# Patient Record
Sex: Male | Born: 1955 | Race: Black or African American | Hispanic: No | Marital: Single | Smoking: Never smoker
Health system: Southern US, Community
[De-identification: ages and names within clinical notes are randomized; demographics above are authoritative.]

## PROBLEM LIST (undated history)

## (undated) DIAGNOSIS — Z992 Dependence on renal dialysis: Secondary | ICD-10-CM

## (undated) DIAGNOSIS — N186 End stage renal disease: Secondary | ICD-10-CM

## (undated) DIAGNOSIS — D649 Anemia, unspecified: Secondary | ICD-10-CM

## (undated) DIAGNOSIS — E119 Type 2 diabetes mellitus without complications: Secondary | ICD-10-CM

---

## 2004-07-20 ENCOUNTER — Emergency Department: Payer: Self-pay | Admitting: Emergency Medicine

## 2006-08-02 ENCOUNTER — Emergency Department: Payer: Self-pay | Admitting: Emergency Medicine

## 2006-12-20 ENCOUNTER — Emergency Department: Payer: Self-pay

## 2008-07-18 ENCOUNTER — Emergency Department: Payer: Self-pay | Admitting: Emergency Medicine

## 2009-10-30 ENCOUNTER — Other Ambulatory Visit: Payer: Self-pay | Admitting: Internal Medicine

## 2009-10-31 ENCOUNTER — Ambulatory Visit: Payer: Self-pay | Admitting: Nephrology

## 2009-11-11 ENCOUNTER — Ambulatory Visit: Payer: Self-pay | Admitting: Oncology

## 2009-11-13 ENCOUNTER — Other Ambulatory Visit: Payer: Self-pay | Admitting: Nephrology

## 2009-11-14 ENCOUNTER — Inpatient Hospital Stay: Payer: Self-pay | Admitting: Internal Medicine

## 2009-12-12 ENCOUNTER — Ambulatory Visit: Payer: Self-pay | Admitting: Oncology

## 2010-02-11 ENCOUNTER — Ambulatory Visit: Payer: Self-pay | Admitting: Oncology

## 2010-03-23 ENCOUNTER — Ambulatory Visit: Payer: Self-pay | Admitting: Vascular Surgery

## 2010-06-10 ENCOUNTER — Ambulatory Visit: Payer: Self-pay | Admitting: Vascular Surgery

## 2010-06-17 ENCOUNTER — Ambulatory Visit: Payer: Self-pay | Admitting: Vascular Surgery

## 2010-08-21 ENCOUNTER — Ambulatory Visit: Payer: Self-pay | Admitting: Internal Medicine

## 2010-10-22 ENCOUNTER — Emergency Department: Payer: Self-pay | Admitting: Emergency Medicine

## 2010-10-23 ENCOUNTER — Inpatient Hospital Stay: Payer: Self-pay | Admitting: Vascular Surgery

## 2010-12-07 ENCOUNTER — Ambulatory Visit: Payer: Self-pay | Admitting: Vascular Surgery

## 2010-12-16 ENCOUNTER — Ambulatory Visit: Payer: Self-pay | Admitting: Vascular Surgery

## 2011-01-11 ENCOUNTER — Ambulatory Visit: Payer: Self-pay | Admitting: Internal Medicine

## 2011-03-05 ENCOUNTER — Ambulatory Visit: Payer: Self-pay | Admitting: Vascular Surgery

## 2011-03-12 ENCOUNTER — Ambulatory Visit: Payer: Self-pay | Admitting: Vascular Surgery

## 2011-03-26 ENCOUNTER — Ambulatory Visit: Payer: Self-pay | Admitting: Vascular Surgery

## 2011-04-23 ENCOUNTER — Ambulatory Visit: Payer: Self-pay | Admitting: Vascular Surgery

## 2011-06-01 ENCOUNTER — Ambulatory Visit: Payer: Self-pay | Admitting: Vascular Surgery

## 2011-10-08 ENCOUNTER — Ambulatory Visit: Payer: Self-pay | Admitting: Vascular Surgery

## 2011-10-08 LAB — POTASSIUM: Potassium: 4.2 mmol/L (ref 3.5–5.1)

## 2011-11-19 ENCOUNTER — Ambulatory Visit: Payer: Self-pay | Admitting: Vascular Surgery

## 2011-12-10 ENCOUNTER — Ambulatory Visit: Payer: Self-pay | Admitting: Vascular Surgery

## 2011-12-10 LAB — POTASSIUM: Potassium: 3.6 mmol/L (ref 3.5–5.1)

## 2011-12-24 ENCOUNTER — Ambulatory Visit: Payer: Self-pay | Admitting: Vascular Surgery

## 2011-12-24 LAB — BASIC METABOLIC PANEL
BUN: 19 mg/dL — ABNORMAL HIGH (ref 7–18)
Calcium, Total: 9.5 mg/dL (ref 8.5–10.1)
Chloride: 97 mmol/L — ABNORMAL LOW (ref 98–107)
Co2: 31 mmol/L (ref 21–32)
EGFR (African American): 7 — ABNORMAL LOW
EGFR (Non-African Amer.): 6 — ABNORMAL LOW
Osmolality: 287 (ref 275–301)
Potassium: 4.9 mmol/L (ref 3.5–5.1)
Sodium: 140 mmol/L (ref 136–145)

## 2011-12-24 LAB — CBC
MCH: 31 pg (ref 26.0–34.0)
MCHC: 32.1 g/dL (ref 32.0–36.0)
MCV: 96 fL (ref 80–100)
Platelet: 225 10*3/uL (ref 150–440)
RBC: 3.35 10*6/uL — ABNORMAL LOW (ref 4.40–5.90)

## 2011-12-31 ENCOUNTER — Ambulatory Visit: Payer: Self-pay | Admitting: Vascular Surgery

## 2011-12-31 LAB — POTASSIUM: Potassium: 3.9 mmol/L (ref 3.5–5.1)

## 2012-09-20 ENCOUNTER — Ambulatory Visit: Payer: Self-pay | Admitting: Vascular Surgery

## 2012-10-18 ENCOUNTER — Ambulatory Visit: Payer: Self-pay | Admitting: Nephrology

## 2013-03-02 ENCOUNTER — Ambulatory Visit: Payer: Self-pay | Admitting: Urology

## 2013-06-29 ENCOUNTER — Ambulatory Visit: Payer: Self-pay | Admitting: Vascular Surgery

## 2014-02-18 ENCOUNTER — Ambulatory Visit: Payer: Self-pay | Admitting: Urology

## 2014-03-16 ENCOUNTER — Other Ambulatory Visit: Payer: Self-pay | Admitting: Nephrology

## 2014-03-16 LAB — POTASSIUM: Potassium: 4.2 mmol/L (ref 3.5–5.1)

## 2014-05-22 ENCOUNTER — Ambulatory Visit: Payer: Self-pay | Admitting: Nephrology

## 2014-08-27 ENCOUNTER — Inpatient Hospital Stay: Payer: Self-pay | Admitting: Internal Medicine

## 2014-08-27 LAB — COMPREHENSIVE METABOLIC PANEL
ALT: 14 U/L
Albumin: 2.4 g/dL — ABNORMAL LOW (ref 3.4–5.0)
Alkaline Phosphatase: 77 U/L
Anion Gap: 11 (ref 7–16)
BUN: 51 mg/dL — AB (ref 7–18)
Bilirubin,Total: 0.8 mg/dL (ref 0.2–1.0)
CALCIUM: 10.3 mg/dL — AB (ref 8.5–10.1)
CO2: 31 mmol/L (ref 21–32)
Chloride: 92 mmol/L — ABNORMAL LOW (ref 98–107)
Creatinine: 12.55 mg/dL — ABNORMAL HIGH (ref 0.60–1.30)
GFR CALC AF AMER: 5 — AB
GFR CALC NON AF AMER: 4 — AB
Glucose: 203 mg/dL — ABNORMAL HIGH (ref 65–99)
OSMOLALITY: 288 (ref 275–301)
Potassium: 4.1 mmol/L (ref 3.5–5.1)
SGOT(AST): 17 U/L (ref 15–37)
Sodium: 134 mmol/L — ABNORMAL LOW (ref 136–145)
Total Protein: 7.8 g/dL (ref 6.4–8.2)

## 2014-08-27 LAB — PROTIME-INR
INR: 1.5
Prothrombin Time: 17.8 secs — ABNORMAL HIGH (ref 11.5–14.7)

## 2014-08-27 LAB — CBC
HCT: 25.3 % — ABNORMAL LOW (ref 40.0–52.0)
HGB: 7.8 g/dL — AB (ref 13.0–18.0)
MCH: 26.9 pg (ref 26.0–34.0)
MCHC: 30.7 g/dL — AB (ref 32.0–36.0)
MCV: 88 fL (ref 80–100)
PLATELETS: 323 10*3/uL (ref 150–440)
RBC: 2.89 10*6/uL — ABNORMAL LOW (ref 4.40–5.90)
RDW: 17 % — ABNORMAL HIGH (ref 11.5–14.5)
WBC: 17.8 10*3/uL — AB (ref 3.8–10.6)

## 2014-08-27 LAB — CK TOTAL AND CKMB (NOT AT ARMC)
CK, TOTAL: 158 U/L (ref 39–308)
CK-MB: 0.7 ng/mL (ref 0.5–3.6)

## 2014-08-27 LAB — TROPONIN I: Troponin-I: 0.02 ng/mL

## 2014-08-27 LAB — PRO B NATRIURETIC PEPTIDE: B-Type Natriuretic Peptide: 13637 pg/mL — ABNORMAL HIGH (ref 0–125)

## 2014-08-27 LAB — PHOSPHORUS: PHOSPHORUS: 6.5 mg/dL — AB (ref 2.5–4.9)

## 2014-08-28 LAB — CBC WITH DIFFERENTIAL/PLATELET
BASOS PCT: 0.4 %
Basophil #: 0.1 10*3/uL (ref 0.0–0.1)
EOS ABS: 0.6 10*3/uL (ref 0.0–0.7)
EOS PCT: 5.2 %
HCT: 27.6 % — AB (ref 40.0–52.0)
HGB: 8.5 g/dL — AB (ref 13.0–18.0)
LYMPHS PCT: 9.3 %
Lymphocyte #: 1.1 10*3/uL (ref 1.0–3.6)
MCH: 26.9 pg (ref 26.0–34.0)
MCHC: 30.6 g/dL — ABNORMAL LOW (ref 32.0–36.0)
MCV: 88 fL (ref 80–100)
Monocyte #: 1.5 x10 3/mm — ABNORMAL HIGH (ref 0.2–1.0)
Monocyte %: 13 %
Neutrophil #: 8.6 10*3/uL — ABNORMAL HIGH (ref 1.4–6.5)
Neutrophil %: 72.1 %
PLATELETS: 317 10*3/uL (ref 150–440)
RBC: 3.15 10*6/uL — ABNORMAL LOW (ref 4.40–5.90)
RDW: 17 % — AB (ref 11.5–14.5)
WBC: 11.9 10*3/uL — ABNORMAL HIGH (ref 3.8–10.6)

## 2014-08-28 LAB — CLOSTRIDIUM DIFFICILE(ARMC)

## 2014-08-29 LAB — CULTURE, BLOOD (SINGLE)

## 2014-08-30 LAB — RENAL FUNCTION PANEL
Albumin: 2.2 g/dL — ABNORMAL LOW (ref 3.4–5.0)
Anion Gap: 13 (ref 7–16)
BUN: 62 mg/dL — AB (ref 7–18)
CO2: 29 mmol/L (ref 21–32)
Calcium, Total: 9.9 mg/dL (ref 8.5–10.1)
Chloride: 95 mmol/L — ABNORMAL LOW (ref 98–107)
Creatinine: 13.58 mg/dL — ABNORMAL HIGH (ref 0.60–1.30)
EGFR (African American): 5 — ABNORMAL LOW
EGFR (Non-African Amer.): 4 — ABNORMAL LOW
GLUCOSE: 194 mg/dL — AB (ref 65–99)
Osmolality: 297 (ref 275–301)
Phosphorus: 6.1 mg/dL — ABNORMAL HIGH (ref 2.5–4.9)
Potassium: 4 mmol/L (ref 3.5–5.1)
Sodium: 137 mmol/L (ref 136–145)

## 2014-08-30 LAB — CBC WITH DIFFERENTIAL/PLATELET
BASOS ABS: 0.1 10*3/uL (ref 0.0–0.1)
Basophil %: 0.5 %
Eosinophil #: 0.5 10*3/uL (ref 0.0–0.7)
Eosinophil %: 4.2 %
HCT: 24.5 % — ABNORMAL LOW (ref 40.0–52.0)
HGB: 7.3 g/dL — ABNORMAL LOW (ref 13.0–18.0)
Lymphocyte #: 1.1 10*3/uL (ref 1.0–3.6)
Lymphocyte %: 9.5 %
MCH: 26.6 pg (ref 26.0–34.0)
MCHC: 29.9 g/dL — AB (ref 32.0–36.0)
MCV: 89 fL (ref 80–100)
MONO ABS: 1.4 x10 3/mm — AB (ref 0.2–1.0)
Monocyte %: 11.8 %
Neutrophil #: 8.5 10*3/uL — ABNORMAL HIGH (ref 1.4–6.5)
Neutrophil %: 74 %
Platelet: 266 10*3/uL (ref 150–440)
RBC: 2.75 10*6/uL — ABNORMAL LOW (ref 4.40–5.90)
RDW: 17.4 % — ABNORMAL HIGH (ref 11.5–14.5)
WBC: 11.5 10*3/uL — ABNORMAL HIGH (ref 3.8–10.6)

## 2014-08-31 LAB — PHOSPHORUS: Phosphorus: 4.9 mg/dL (ref 2.5–4.9)

## 2014-08-31 LAB — VANCOMYCIN, TROUGH: Vancomycin, Trough: 24 ug/mL (ref 10–20)

## 2014-09-01 LAB — CBC WITH DIFFERENTIAL/PLATELET
BASOS ABS: 0.1 10*3/uL (ref 0.0–0.1)
Basophil %: 1 %
EOS ABS: 0.4 10*3/uL (ref 0.0–0.7)
Eosinophil %: 3 %
HCT: 24.1 % — AB (ref 40.0–52.0)
HGB: 7.3 g/dL — ABNORMAL LOW (ref 13.0–18.0)
LYMPHS PCT: 14.6 %
Lymphocyte #: 2 10*3/uL (ref 1.0–3.6)
MCH: 26.9 pg (ref 26.0–34.0)
MCHC: 30.4 g/dL — AB (ref 32.0–36.0)
MCV: 89 fL (ref 80–100)
MONOS PCT: 11.6 %
Monocyte #: 1.6 x10 3/mm — ABNORMAL HIGH (ref 0.2–1.0)
Neutrophil #: 9.4 10*3/uL — ABNORMAL HIGH (ref 1.4–6.5)
Neutrophil %: 69.8 %
Platelet: 243 10*3/uL (ref 150–440)
RBC: 2.72 10*6/uL — ABNORMAL LOW (ref 4.40–5.90)
RDW: 17.3 % — ABNORMAL HIGH (ref 11.5–14.5)
WBC: 13.4 10*3/uL — AB (ref 3.8–10.6)

## 2014-09-01 LAB — BASIC METABOLIC PANEL
ANION GAP: 7 (ref 7–16)
BUN: 26 mg/dL — AB (ref 7–18)
CALCIUM: 10.4 mg/dL — AB (ref 8.5–10.1)
Chloride: 97 mmol/L — ABNORMAL LOW (ref 98–107)
Co2: 32 mmol/L (ref 21–32)
Creatinine: 6.69 mg/dL — ABNORMAL HIGH (ref 0.60–1.30)
EGFR (African American): 11 — ABNORMAL LOW
GFR CALC NON AF AMER: 9 — AB
Glucose: 215 mg/dL — ABNORMAL HIGH (ref 65–99)
Osmolality: 283 (ref 275–301)
POTASSIUM: 4.2 mmol/L (ref 3.5–5.1)
Sodium: 136 mmol/L (ref 136–145)

## 2014-09-02 LAB — CBC WITH DIFFERENTIAL/PLATELET
Basophil #: 0 10*3/uL (ref 0.0–0.1)
Basophil %: 0.2 %
EOS PCT: 3.2 %
Eosinophil #: 0.4 10*3/uL (ref 0.0–0.7)
HCT: 25 % — ABNORMAL LOW (ref 40.0–52.0)
HGB: 7.6 g/dL — AB (ref 13.0–18.0)
Lymphocyte #: 1.5 10*3/uL (ref 1.0–3.6)
Lymphocyte %: 12.3 %
MCH: 26.7 pg (ref 26.0–34.0)
MCHC: 30.5 g/dL — AB (ref 32.0–36.0)
MCV: 88 fL (ref 80–100)
MONO ABS: 1.4 x10 3/mm — AB (ref 0.2–1.0)
MONOS PCT: 11.3 %
NEUTROS PCT: 73 %
Neutrophil #: 8.9 10*3/uL — ABNORMAL HIGH (ref 1.4–6.5)
PLATELETS: 324 10*3/uL (ref 150–440)
RBC: 2.86 10*6/uL — ABNORMAL LOW (ref 4.40–5.90)
RDW: 17.5 % — ABNORMAL HIGH (ref 11.5–14.5)
WBC: 12.1 10*3/uL — ABNORMAL HIGH (ref 3.8–10.6)

## 2014-09-02 LAB — BASIC METABOLIC PANEL
Anion Gap: 7 (ref 7–16)
BUN: 41 mg/dL — AB (ref 7–18)
CREATININE: 8.93 mg/dL — AB (ref 0.60–1.30)
Calcium, Total: 10.6 mg/dL — ABNORMAL HIGH (ref 8.5–10.1)
Chloride: 98 mmol/L (ref 98–107)
Co2: 32 mmol/L (ref 21–32)
EGFR (African American): 8 — ABNORMAL LOW
GFR CALC NON AF AMER: 7 — AB
Glucose: 156 mg/dL — ABNORMAL HIGH (ref 65–99)
OSMOLALITY: 287 (ref 275–301)
Potassium: 4.6 mmol/L (ref 3.5–5.1)
SODIUM: 137 mmol/L (ref 136–145)

## 2014-09-03 LAB — PHOSPHORUS: Phosphorus: 6.4 mg/dL — ABNORMAL HIGH (ref 2.5–4.9)

## 2014-09-03 LAB — CULTURE, BLOOD (SINGLE)

## 2014-09-04 LAB — CBC WITH DIFFERENTIAL/PLATELET
BASOS PCT: 0.7 %
Basophil #: 0.1 10*3/uL (ref 0.0–0.1)
Eosinophil #: 0.3 10*3/uL (ref 0.0–0.7)
Eosinophil %: 4 %
HCT: 21.4 % — AB (ref 40.0–52.0)
HGB: 6.7 g/dL — ABNORMAL LOW (ref 13.0–18.0)
LYMPHS ABS: 1.2 10*3/uL (ref 1.0–3.6)
LYMPHS PCT: 13.6 %
MCH: 27.1 pg (ref 26.0–34.0)
MCHC: 31.2 g/dL — ABNORMAL LOW (ref 32.0–36.0)
MCV: 87 fL (ref 80–100)
MONO ABS: 1.3 x10 3/mm — AB (ref 0.2–1.0)
MONOS PCT: 15.4 %
NEUTROS ABS: 5.6 10*3/uL (ref 1.4–6.5)
NEUTROS PCT: 66.3 %
Platelet: 181 10*3/uL (ref 150–440)
RBC: 2.46 10*6/uL — ABNORMAL LOW (ref 4.40–5.90)
RDW: 17.3 % — AB (ref 11.5–14.5)
WBC: 8.5 10*3/uL (ref 3.8–10.6)

## 2014-09-05 LAB — CBC WITH DIFFERENTIAL/PLATELET
Basophil #: 0.1 10*3/uL (ref 0.0–0.1)
Basophil %: 0.7 %
EOS PCT: 4.5 %
Eosinophil #: 0.4 10*3/uL (ref 0.0–0.7)
HCT: 21.1 % — ABNORMAL LOW (ref 40.0–52.0)
HGB: 6.5 g/dL — AB (ref 13.0–18.0)
LYMPHS ABS: 1.2 10*3/uL (ref 1.0–3.6)
Lymphocyte %: 12.3 %
MCH: 26.9 pg (ref 26.0–34.0)
MCHC: 31.1 g/dL — ABNORMAL LOW (ref 32.0–36.0)
MCV: 87 fL (ref 80–100)
MONOS PCT: 14.3 %
Monocyte #: 1.4 x10 3/mm — ABNORMAL HIGH (ref 0.2–1.0)
Neutrophil #: 6.6 10*3/uL — ABNORMAL HIGH (ref 1.4–6.5)
Neutrophil %: 68.2 %
PLATELETS: 238 10*3/uL (ref 150–440)
RBC: 2.43 10*6/uL — ABNORMAL LOW (ref 4.40–5.90)
RDW: 17.1 % — ABNORMAL HIGH (ref 11.5–14.5)
WBC: 9.6 10*3/uL (ref 3.8–10.6)

## 2014-09-05 LAB — RENAL FUNCTION PANEL
Albumin: 2.1 g/dL — ABNORMAL LOW (ref 3.4–5.0)
Anion Gap: 8 (ref 7–16)
BUN: 39 mg/dL — ABNORMAL HIGH (ref 7–18)
CALCIUM: 9.7 mg/dL (ref 8.5–10.1)
CO2: 30 mmol/L (ref 21–32)
CREATININE: 9.45 mg/dL — AB (ref 0.60–1.30)
Chloride: 98 mmol/L (ref 98–107)
EGFR (African American): 7 — ABNORMAL LOW
EGFR (Non-African Amer.): 6 — ABNORMAL LOW
Glucose: 123 mg/dL — ABNORMAL HIGH (ref 65–99)
Osmolality: 283 (ref 275–301)
POTASSIUM: 4.8 mmol/L (ref 3.5–5.1)
Phosphorus: 7.3 mg/dL — ABNORMAL HIGH (ref 2.5–4.9)
Sodium: 136 mmol/L (ref 136–145)

## 2014-09-06 LAB — CBC WITH DIFFERENTIAL/PLATELET
BASOS ABS: 0.1 10*3/uL (ref 0.0–0.1)
Basophil %: 0.8 %
Eosinophil #: 0.4 10*3/uL (ref 0.0–0.7)
Eosinophil %: 3.7 %
HCT: 24 % — AB (ref 40.0–52.0)
HGB: 7.4 g/dL — AB (ref 13.0–18.0)
Lymphocyte #: 1.6 10*3/uL (ref 1.0–3.6)
Lymphocyte %: 15.9 %
MCH: 26.7 pg (ref 26.0–34.0)
MCHC: 30.8 g/dL — AB (ref 32.0–36.0)
MCV: 87 fL (ref 80–100)
MONOS PCT: 11.9 %
Monocyte #: 1.2 x10 3/mm — ABNORMAL HIGH (ref 0.2–1.0)
Neutrophil #: 7 10*3/uL — ABNORMAL HIGH (ref 1.4–6.5)
Neutrophil %: 67.7 %
Platelet: 169 10*3/uL (ref 150–440)
RBC: 2.76 10*6/uL — AB (ref 4.40–5.90)
RDW: 17.8 % — ABNORMAL HIGH (ref 11.5–14.5)
WBC: 10.3 10*3/uL (ref 3.8–10.6)

## 2014-09-06 LAB — CLOSTRIDIUM DIFFICILE(ARMC)

## 2014-09-07 LAB — CBC WITH DIFFERENTIAL/PLATELET
Basophil #: 0.4 10*3/uL — ABNORMAL HIGH (ref 0.0–0.1)
Basophil %: 3.8 %
Eosinophil #: 0.4 10*3/uL (ref 0.0–0.7)
Eosinophil %: 4.2 %
HCT: 23.9 % — ABNORMAL LOW (ref 40.0–52.0)
HGB: 7.4 g/dL — AB (ref 13.0–18.0)
Lymphocyte #: 0.9 10*3/uL — ABNORMAL LOW (ref 1.0–3.6)
Lymphocyte %: 9.2 %
MCH: 26.7 pg (ref 26.0–34.0)
MCHC: 30.9 g/dL — ABNORMAL LOW (ref 32.0–36.0)
MCV: 86 fL (ref 80–100)
MONOS PCT: 7.4 %
Monocyte #: 0.7 x10 3/mm (ref 0.2–1.0)
Neutrophil #: 7.1 10*3/uL — ABNORMAL HIGH (ref 1.4–6.5)
Neutrophil %: 75.4 %
Platelet: 273 10*3/uL (ref 150–440)
RBC: 2.77 10*6/uL — ABNORMAL LOW (ref 4.40–5.90)
RDW: 17.5 % — AB (ref 11.5–14.5)
WBC: 9.4 10*3/uL (ref 3.8–10.6)

## 2014-09-07 LAB — VANCOMYCIN, TROUGH: Vancomycin, Trough: 24 ug/mL (ref 10–20)

## 2014-09-07 LAB — PHOSPHORUS: Phosphorus: 6.6 mg/dL — ABNORMAL HIGH (ref 2.5–4.9)

## 2014-09-07 LAB — WOUND CULTURE

## 2014-09-07 LAB — CULTURE, BLOOD (SINGLE)

## 2014-09-08 LAB — CBC WITH DIFFERENTIAL/PLATELET
BASOS ABS: 0 10*3/uL (ref 0.0–0.1)
Basophil %: 0.3 %
EOS ABS: 0.3 10*3/uL (ref 0.0–0.7)
Eosinophil %: 3.6 %
HCT: 22.9 % — AB (ref 40.0–52.0)
HGB: 7.1 g/dL — AB (ref 13.0–18.0)
Lymphocyte #: 1.6 10*3/uL (ref 1.0–3.6)
Lymphocyte %: 18.3 %
MCH: 26.7 pg (ref 26.0–34.0)
MCHC: 31.1 g/dL — ABNORMAL LOW (ref 32.0–36.0)
MCV: 86 fL (ref 80–100)
MONO ABS: 1.2 x10 3/mm — AB (ref 0.2–1.0)
Monocyte %: 13.1 %
Neutrophil #: 5.8 10*3/uL (ref 1.4–6.5)
Neutrophil %: 64.7 %
PLATELETS: 246 10*3/uL (ref 150–440)
RBC: 2.66 10*6/uL — AB (ref 4.40–5.90)
RDW: 17.6 % — ABNORMAL HIGH (ref 11.5–14.5)
WBC: 9 10*3/uL (ref 3.8–10.6)

## 2014-09-09 LAB — CBC WITH DIFFERENTIAL/PLATELET
BASOS ABS: 0.1 10*3/uL (ref 0.0–0.1)
Basophil %: 1.1 %
EOS ABS: 0.3 10*3/uL (ref 0.0–0.7)
EOS PCT: 3.7 %
HCT: 22.8 % — AB (ref 40.0–52.0)
HGB: 7.1 g/dL — ABNORMAL LOW (ref 13.0–18.0)
Lymphocyte #: 1.3 10*3/uL (ref 1.0–3.6)
Lymphocyte %: 15 %
MCH: 26.7 pg (ref 26.0–34.0)
MCHC: 31.3 g/dL — ABNORMAL LOW (ref 32.0–36.0)
MCV: 85 fL (ref 80–100)
MONO ABS: 1.1 x10 3/mm — AB (ref 0.2–1.0)
Monocyte %: 12.5 %
Neutrophil #: 5.9 10*3/uL (ref 1.4–6.5)
Neutrophil %: 67.7 %
PLATELETS: 275 10*3/uL (ref 150–440)
RBC: 2.67 10*6/uL — AB (ref 4.40–5.90)
RDW: 17.7 % — AB (ref 11.5–14.5)
WBC: 8.7 10*3/uL (ref 3.8–10.6)

## 2014-09-09 LAB — WOUND CULTURE

## 2014-09-10 LAB — CBC WITH DIFFERENTIAL/PLATELET
BASOS ABS: 0.1 10*3/uL (ref 0.0–0.1)
Basophil %: 0.7 %
EOS ABS: 0.3 10*3/uL (ref 0.0–0.7)
Eosinophil %: 2.8 %
HCT: 22.8 % — ABNORMAL LOW (ref 40.0–52.0)
HGB: 7.1 g/dL — AB (ref 13.0–18.0)
Lymphocyte #: 1.1 10*3/uL (ref 1.0–3.6)
Lymphocyte %: 10.8 %
MCH: 26.7 pg (ref 26.0–34.0)
MCHC: 31 g/dL — ABNORMAL LOW (ref 32.0–36.0)
MCV: 86 fL (ref 80–100)
Monocyte #: 1.4 x10 3/mm — ABNORMAL HIGH (ref 0.2–1.0)
Monocyte %: 13.4 %
NEUTROS PCT: 72.3 %
Neutrophil #: 7.3 10*3/uL — ABNORMAL HIGH (ref 1.4–6.5)
PLATELETS: 223 10*3/uL (ref 150–440)
RBC: 2.65 10*6/uL — ABNORMAL LOW (ref 4.40–5.90)
RDW: 17.2 % — AB (ref 11.5–14.5)
WBC: 10.1 10*3/uL (ref 3.8–10.6)

## 2014-09-10 LAB — PHOSPHORUS: Phosphorus: 6.1 mg/dL — ABNORMAL HIGH (ref 2.5–4.9)

## 2014-11-05 ENCOUNTER — Emergency Department: Payer: Self-pay | Admitting: Emergency Medicine

## 2014-11-07 ENCOUNTER — Emergency Department: Payer: Self-pay | Admitting: Emergency Medicine

## 2014-11-08 ENCOUNTER — Emergency Department: Payer: Self-pay | Admitting: Emergency Medicine

## 2014-11-12 ENCOUNTER — Inpatient Hospital Stay: Payer: Self-pay | Admitting: Internal Medicine

## 2014-12-04 ENCOUNTER — Ambulatory Visit: Payer: Self-pay | Admitting: Vascular Surgery

## 2014-12-06 ENCOUNTER — Ambulatory Visit: Payer: Self-pay | Admitting: Vascular Surgery

## 2014-12-06 ENCOUNTER — Emergency Department: Payer: Self-pay | Admitting: Emergency Medicine

## 2014-12-07 ENCOUNTER — Emergency Department: Payer: Self-pay | Admitting: Internal Medicine

## 2014-12-19 ENCOUNTER — Inpatient Hospital Stay
Admit: 2014-12-19 | Disposition: A | Discharge status: Skilled Nursing Facility | Payer: Self-pay | Attending: Internal Medicine | Admitting: Internal Medicine

## 2014-12-19 LAB — CBC WITH DIFFERENTIAL/PLATELET
Basophil #: 0 10*3/uL (ref 0.0–0.1)
Basophil %: 0.2 %
EOS PCT: 1.4 %
Eosinophil #: 0.2 10*3/uL (ref 0.0–0.7)
HCT: 33.8 % — ABNORMAL LOW (ref 40.0–52.0)
HGB: 10.5 g/dL — ABNORMAL LOW (ref 13.0–18.0)
LYMPHS ABS: 0.8 10*3/uL — AB (ref 1.0–3.6)
LYMPHS PCT: 7.4 %
MCH: 26.6 pg (ref 26.0–34.0)
MCHC: 31 g/dL — ABNORMAL LOW (ref 32.0–36.0)
MCV: 86 fL (ref 80–100)
MONO ABS: 1.1 x10 3/mm — AB (ref 0.2–1.0)
MONOS PCT: 9.6 %
Neutrophil #: 8.9 10*3/uL — ABNORMAL HIGH (ref 1.4–6.5)
Neutrophil %: 81.4 %
PLATELETS: 224 10*3/uL (ref 150–440)
RBC: 3.94 10*6/uL — AB (ref 4.40–5.90)
RDW: 20.9 % — ABNORMAL HIGH (ref 11.5–14.5)
WBC: 11 10*3/uL — AB (ref 3.8–10.6)

## 2014-12-19 LAB — BASIC METABOLIC PANEL
Anion Gap: 13 (ref 7–16)
BUN: 30 mg/dL — ABNORMAL HIGH
CALCIUM: 8.7 mg/dL — AB
CO2: 27 mmol/L
Chloride: 98 mmol/L — ABNORMAL LOW
Creatinine: 6.08 mg/dL — ABNORMAL HIGH
EGFR (Non-African Amer.): 9 — ABNORMAL LOW
GFR CALC AF AMER: 11 — AB
GLUCOSE: 90 mg/dL
Potassium: 3.7 mmol/L
SODIUM: 138 mmol/L

## 2014-12-20 LAB — CBC WITH DIFFERENTIAL/PLATELET
BASOS PCT: 0.6 %
Basophil #: 0.1 10*3/uL (ref 0.0–0.1)
Eosinophil #: 0.2 10*3/uL (ref 0.0–0.7)
Eosinophil %: 1.4 %
HCT: 36.1 % — ABNORMAL LOW (ref 40.0–52.0)
HGB: 11.1 g/dL — ABNORMAL LOW (ref 13.0–18.0)
Lymphocyte #: 1.1 10*3/uL (ref 1.0–3.6)
Lymphocyte %: 7.3 %
MCH: 26.5 pg (ref 26.0–34.0)
MCHC: 30.6 g/dL — ABNORMAL LOW (ref 32.0–36.0)
MCV: 87 fL (ref 80–100)
MONOS PCT: 8 %
Monocyte #: 1.3 x10 3/mm — ABNORMAL HIGH (ref 0.2–1.0)
NEUTROS ABS: 12.9 10*3/uL — AB (ref 1.4–6.5)
Neutrophil %: 82.7 %
Platelet: 243 10*3/uL (ref 150–440)
RBC: 4.17 10*6/uL — ABNORMAL LOW (ref 4.40–5.90)
RDW: 21.4 % — ABNORMAL HIGH (ref 11.5–14.5)
WBC: 15.7 10*3/uL — AB (ref 3.8–10.6)

## 2014-12-20 LAB — BASIC METABOLIC PANEL
Anion Gap: 14 (ref 7–16)
BUN: 37 mg/dL — AB
CALCIUM: 8.7 mg/dL — AB
CHLORIDE: 97 mmol/L — AB
Co2: 26 mmol/L
Creatinine: 7.44 mg/dL — ABNORMAL HIGH
EGFR (African American): 9 — ABNORMAL LOW
EGFR (Non-African Amer.): 7 — ABNORMAL LOW
GLUCOSE: 78 mg/dL
Potassium: 3.9 mmol/L
Sodium: 137 mmol/L

## 2014-12-20 LAB — PHOSPHORUS: PHOSPHORUS: 5.7 mg/dL — AB

## 2014-12-21 LAB — RENAL FUNCTION PANEL
Albumin: 2.5 g/dL — ABNORMAL LOW
Anion Gap: 12 (ref 7–16)
BUN: 44 mg/dL — ABNORMAL HIGH
CALCIUM: 8.7 mg/dL — AB
CHLORIDE: 94 mmol/L — AB
CREATININE: 8.52 mg/dL — AB
Co2: 28 mmol/L
EGFR (African American): 7 — ABNORMAL LOW
EGFR (Non-African Amer.): 6 — ABNORMAL LOW
Glucose: 107 mg/dL — ABNORMAL HIGH
Phosphorus: 6.2 mg/dL — ABNORMAL HIGH
Potassium: 3.8 mmol/L
Sodium: 134 mmol/L — ABNORMAL LOW

## 2014-12-21 LAB — CBC WITH DIFFERENTIAL/PLATELET
Basophil #: 0.1 10*3/uL (ref 0.0–0.1)
Basophil %: 0.5 %
EOS ABS: 0.3 10*3/uL (ref 0.0–0.7)
Eosinophil %: 2.7 %
HCT: 33.6 % — AB (ref 40.0–52.0)
HGB: 10.3 g/dL — AB (ref 13.0–18.0)
LYMPHS PCT: 11 %
Lymphocyte #: 1.4 10*3/uL (ref 1.0–3.6)
MCH: 26.6 pg (ref 26.0–34.0)
MCHC: 30.7 g/dL — ABNORMAL LOW (ref 32.0–36.0)
MCV: 87 fL (ref 80–100)
Monocyte #: 1.3 x10 3/mm — ABNORMAL HIGH (ref 0.2–1.0)
Monocyte %: 10.4 %
Neutrophil #: 9.7 10*3/uL — ABNORMAL HIGH (ref 1.4–6.5)
Neutrophil %: 75.4 %
Platelet: 200 10*3/uL (ref 150–440)
RBC: 3.89 10*6/uL — AB (ref 4.40–5.90)
RDW: 20.6 % — ABNORMAL HIGH (ref 11.5–14.5)
WBC: 12.8 10*3/uL — AB (ref 3.8–10.6)

## 2014-12-22 LAB — RENAL FUNCTION PANEL
ANION GAP: 12 (ref 7–16)
Albumin: 2.6 g/dL — ABNORMAL LOW
BUN: 49 mg/dL — AB
CREATININE: 9.65 mg/dL — AB
Calcium, Total: 8.8 mg/dL — ABNORMAL LOW
Chloride: 96 mmol/L — ABNORMAL LOW
Co2: 28 mmol/L
GFR CALC AF AMER: 6 — AB
GFR CALC NON AF AMER: 5 — AB
GLUCOSE: 109 mg/dL — AB
Phosphorus: 6.5 mg/dL — ABNORMAL HIGH
Potassium: 4.1 mmol/L
SODIUM: 136 mmol/L

## 2014-12-23 LAB — MISC AER/ANAEROBIC CULT.

## 2014-12-23 LAB — PHOSPHORUS: Phosphorus: 7.6 mg/dL — ABNORMAL HIGH

## 2014-12-24 LAB — RENAL FUNCTION PANEL
Albumin: 2.4 g/dL — ABNORMAL LOW
Anion Gap: 11 (ref 7–16)
BUN: 36 mg/dL — ABNORMAL HIGH
CALCIUM: 9.1 mg/dL
CHLORIDE: 98 mmol/L — AB
CO2: 29 mmol/L
CREATININE: 7.77 mg/dL — AB
EGFR (African American): 8 — ABNORMAL LOW
GFR CALC NON AF AMER: 7 — AB
Glucose: 124 mg/dL — ABNORMAL HIGH
Potassium: 4 mmol/L
SODIUM: 138 mmol/L

## 2014-12-24 LAB — PHOSPHORUS: PHOSPHORUS: 5.7 mg/dL — AB

## 2014-12-24 LAB — PLATELET COUNT: Platelet: 188 10*3/uL (ref 150–440)

## 2014-12-25 LAB — BASIC METABOLIC PANEL
Anion Gap: 9 (ref 7–16)
BUN: 28 mg/dL — AB
CHLORIDE: 99 mmol/L — AB
CO2: 31 mmol/L
CREATININE: 6.13 mg/dL — AB
Calcium, Total: 9.5 mg/dL
GFR CALC AF AMER: 11 — AB
GFR CALC NON AF AMER: 9 — AB
GLUCOSE: 129 mg/dL — AB
Potassium: 3.7 mmol/L
Sodium: 139 mmol/L

## 2014-12-26 ENCOUNTER — Ambulatory Visit
Admit: 2014-12-26 | Disposition: A | Discharge status: Home / Self Care | Payer: Self-pay | Attending: Vascular Surgery | Admitting: Vascular Surgery

## 2014-12-26 ENCOUNTER — Emergency Department
Admit: 2014-12-26 | Disposition: A | Discharge status: Home / Self Care | Payer: Self-pay | Admitting: Emergency Medicine

## 2014-12-26 LAB — COMPREHENSIVE METABOLIC PANEL
ALBUMIN: 2.6 g/dL — AB
Alkaline Phosphatase: 58 U/L
Anion Gap: 7 (ref 7–16)
BUN: 33 mg/dL — AB
Bilirubin,Total: 0.9 mg/dL
CALCIUM: 8.5 mg/dL — AB
Chloride: 99 mmol/L — ABNORMAL LOW
Co2: 30 mmol/L
Creatinine: 6.5 mg/dL — ABNORMAL HIGH
EGFR (Non-African Amer.): 9 — ABNORMAL LOW
GFR CALC AF AMER: 10 — AB
GLUCOSE: 126 mg/dL — AB
POTASSIUM: 4 mmol/L
SGOT(AST): 15 U/L
SGPT (ALT): 9 U/L — ABNORMAL LOW
Sodium: 136 mmol/L
TOTAL PROTEIN: 7.6 g/dL

## 2014-12-26 LAB — CBC
HCT: 32.3 % — AB (ref 40.0–52.0)
HGB: 10.1 g/dL — ABNORMAL LOW (ref 13.0–18.0)
MCH: 27.4 pg (ref 26.0–34.0)
MCHC: 31.3 g/dL — AB (ref 32.0–36.0)
MCV: 87 fL (ref 80–100)
Platelet: 144 10*3/uL — ABNORMAL LOW (ref 150–440)
RBC: 3.69 10*6/uL — ABNORMAL LOW (ref 4.40–5.90)
RDW: 20.4 % — ABNORMAL HIGH (ref 11.5–14.5)
WBC: 9.6 10*3/uL (ref 3.8–10.6)

## 2014-12-26 LAB — CULTURE, BLOOD (SINGLE)

## 2014-12-26 LAB — TROPONIN I: TROPONIN-I: 0.03 ng/mL

## 2014-12-31 ENCOUNTER — Ambulatory Visit
Admit: 2014-12-31 | Disposition: A | Discharge status: Home / Self Care | Payer: Self-pay | Attending: Vascular Surgery | Admitting: Vascular Surgery

## 2015-01-03 NOTE — Op Note (Signed)
PATIENT NAMLuvenia [name]:  [name], [name] MR#:  [id number] DATE OF BIRTH:  09-Jun-1956  DATE OF PROCEDURE:  06/29/2013  PREOPERATIVE DIAGNOSES:  1. Complication of arteriovenous dialysis graft.  2. End-stage renal disease, requiring hemodialysis.   POSTOPERATIVE DIAGNOSES:  1. Complication of arteriovenous dialysis graft.  2. End-stage renal disease, requiring hemodialysis.   PROCEDURE PERFORMED: 1. Contrast injection, left brachial axillary dialysis graft.  2. Percutaneous transluminal angioplasty to 7 mm, left brachial axillary dialysis graft.  3. Placement of a straight Flair stent for failed angioplasty.   PROCEDURE PERFORMED BY: Renford DillsGregory G. Pammie Chirino, MD   SEDATION: Versed 5 mg plus fentanyl 300 mcg administered IV. Continuous ECG, pulse oximetry and cardiopulmonary monitoring is performed throughout the entire procedure by the interventional radiology nurse. Total sedation time was 40 minutes.   ACCESS: A 7 French sheath, antegrade direction, left arm brachial axillary dialysis graft.   CONTRAST USED: Isovue 28 mL.   FLUOROSCOPY TIME: 2.5 minutes.   INDICATIONS: Mr. [name] is a 59 year old gentleman who presents with worsening function of his dialysis access and increasing difficulty with cannulation. Risks and benefits for angiography and intervention were reviewed. All questions answered. The patient agrees to proceed.   DESCRIPTION OF PROCEDURE: The patient is taken to special procedures and placed in the supine position. After adequate sedation is achieved, he is positioned supine, and his left arm is extended palm upward. Left arm is prepped and draped in sterile fashion. Lidocaine 1% is infiltrated into the soft tissues near the arterial anastomosis, and access to the graft is achieved with a micropuncture needle, microwire followed by micro sheath, J-wire followed by a 6 French sheath. Hand injection of contrast is used to demonstrate the AV graft and the central venous anatomy. Multiple  high-grade stenoses are noted over the zone of cannulation, with the most severe being approximately 80% to 85% diameter reduction. There is also some narrowing at the flared portion of the previously placed Flair stent. Central veins are widely patent.   Heparin 3000 units is given, and a 7 x 6 balloon is advanced across the stenoses. It is inflated to 14 atmospheres for 1 minute. Followup imaging demonstrates persistent high-grade stenoses, and therefore an 8 x 70 straight Flair stent is deployed across this region and postdilated with an 8 x 60 Dorado balloon. Several inflations are made. Maximal pressure was 18 atmospheres. The distal anastomosis is also [facility] as well.   Followup imaging demonstrates an excellent result with rapid flow of contrast, smooth contour to the graft and full expansion of the stent.   Pursestring suture of 4-0 Monocryl is placed. The sheath is pulled, pressure is held and there are no immediate complications.   INTERPRETATION: Initial imaging of the graft itself demonstrates the graft is patent; however, over the area that is routinely accessed, both the arterial and venous sites, there is marked irregularity with multiple high-grade strictures, the most severe of which is approximately 80% to 85%. There is modest narrowing at the venous outflow in the previously placed Flair stent. Axillary, subclavian, innominate and superior vena cava are widely patent. Proximal portion of the graft is widely patent.   Following angioplasty, there is inadequate result, and therefore a straight Flair stent is deployed across this area and post dilated with an 8 mm balloon with an excellent result.   SUMMARY: Successful salvage of left arm brachial axillary dialysis graft as described above.   ____________________________ Renford DillsGregory G. Advit Trethewey, MD ggs:lb D: 06/29/2013 12:02:28 ET T:  06/29/2013 12:18:00 ET JOB#: [id number]  cc: Renford Dills, MD,  <Dictator> Renford Dills MD ELECTRONICALLY SIGNED 07/20/2013 8:14

## 2015-01-03 NOTE — Op Note (Signed)
DATE OF BIRTH:  March 04, 1956  DATE OF PROCEDURE:  09/20/2012  PREOPERATIVE DIAGNOSES:  1.  Complication of arteriovenous graft, left arm.  2.  End-stage renal disease, requiring hemodialysis.   POSTOPERATIVE DIAGNOSES:  1.  Complication of arteriovenous graft, left arm.  2.  End-stage renal disease, requiring hemodialysis.   PROCEDURE PERFORMED:  1.  Contrast injection, left arm brachial axillary dialysis graft.    2.  Percutaneous transluminal angioplasty and stent placement, left venous anastomosis, left brachial axillary dialysis graft.   SURGEON:  Renford DillsGregory G. Corley Maffeo, MD  SEDATION:  Versed 6 mg, plus fentanyl 300 mcg administered IV. Continuous ECG, pulse oximetry and cardiopulmonary monitoring was performed throughout the entire procedure by the interventional radiology nurse. Total sedation time was 45 minutes.   ACCESS:  A 7-French sheath, antegrade direction, left arm brachial axillary dialysis graft.   CONTRAST USED:  Isovue 40 mL.   FLUOROSCOPY TIME:  Approximately 3 minutes.   INDICATION:  The patient is a 59 year old gentleman, who has been having increased recirculation, decreasing Kt/V, from his AV graft. Physical examination, as well as noninvasive studies demonstrate a high-grade stenosis with velocities greater than 550 at the venous end. He is therefore undergoing contrast injection for determining the anatomy of the graft and possible intervention. Risks and benefits were reviewed. All questions answered. The patient agrees to proceed.   PROCEDURE:  The patient is taken to special procedures and placed in the supine position. After adequate sedation is achieved, he is positioned supine with his left arm positioned palm upward. The left arm is prepped and draped in a sterile fashion. One percent lidocaine is infiltrated into the soft tissues, and access to the brachial axillary dialysis graft is obtained with a micropuncture needle near the anastomosis in an antegrade  direction, microwire followed by microsheath, J-wire followed by a 6-French sheath. Hand injection of contrast is then utilized to demonstrate the anatomy of the AV graft. Very tight, greater than 80% stenosis is noted at the leading edge of the venous stent. Secondary greater than 70% stenosis noted at the more proximal portion in the axillary vein. No other lesions are identified, and the central veins are imaged, and they are found to be widely patent.   Then, 4000 units of heparin is given. A Magic Torque wire is advanced across the lesions. Subsequently, the sheath is removed, and initially, an 8 x 70 flared stent is deployed, and subsequently, an 8 x 40 straight flared stent is deployed. Both of these are then post dilated with a 7 x 60 Dorado balloon to 28 atmospheres. At the more prominent lesion, there is still residual stenosis, and this is treated with an 8 x 20 Dorado balloon inflated to 30 atmospheres. Followup angiography demonstrates complete resolution of the strictures and rapid flow of contrast through the graft.   Pursestring suture of 4-0 Monocryl is placed, and the sheath is removed. Light pressure is held, and there are no immediate complications.   INTERPRETATION:  Initial views demonstrate the graft has a greater than 80% stenosis at the leading edge of the previously placed venous stent. More proximally in the axillary portion, there is also greater than 70% stenosis. The remaining portion of the graft is widely patent. Following placement of the stents and post dilatation as described above, there is less than 5% residual stenosis and rapid flow of contrast. Central veins are widely patent.   SUMMARY:  Successful salvage of left brachial axillary dialysis graft.   ____________________________ Latina CraverGregory G.  Zacharey Jensen, MD ggs:ms D: 09/20/2012 18:07:17 ET T: 09/20/2012 20:08:16 ET JOB#: [id number]  cc: Renford Dills, MD, <Dictator> Serita Sheller. Maryellen Pile, MD [facility]  [facility] Renford Dills MD ELECTRONICALLY SIGNED 09/22/2012 7:51

## 2015-01-04 NOTE — H&P (Signed)
PATIENT NAMLuvenia Redden:  [name], [name] MR#:  [id number] DATE OF BIRTH:  22-Mar-1956  DATE OF ADMISSION:  08/27/2014  PRIMARY CARE PHYSICIAN: Dr. Thedore MinsSingh   EMERGENCY ROOM PHYSICIAN: Sheran FavaMark R. Fanny BienQuale, MD   CHIEF COMPLAINT: Trouble breathing, constipation.   HISTORY OF PRESENT ILLNESS: This is a 59 year old male patient with hypertension; diabetes mellitus type 2; ESRD, on hemodialysis and history of diabetic nephropathy who comes in because of constipation. The patient was noted to have oxygen saturations of 60% on room air, and the patient's further workup showed CHF and pneumonia. The patient is right now on 55% Venti mask. Saturation is now 90%. The patient's chest x-ray concerning for right upper lobe pneumonia and CHF. The patient is going to be admitted to for acute hypoxic respiratory failure due to CHF and pneumonia. I will ask Dr. Thedore MinsSingh to arrange for hemodialysis today. His regular hemodialysis is on Tuesdays, Thursdays and Saturdays, and he is due for his dialysis today. He finished the regular dialysis on Saturday. The patient complains of cough and phlegm that started about 3 weeks associated with mild trouble breathing. The patient denies any temperature, and he says that he has been constipated for 2 weeks. His bowel movements are very small. The patient mainly came in because of constipation. Denies any abdominal pain or nausea. Complains of loss of appetite. He says his appetite has been poor for 2 weeks. The patient still denies any chest pain,   The patient is not using accessory muscles of respiration. He is not in respiratory distress.   PAST MEDICAL HISTORY: Significant for hypertension; ESRD, on hemodialysis; diabetic nephropathy; history of hyperlipidemia.   ALLERGIES: THE PATIENT IS ALLERGIC TO PENICILLIN.   MEDICATIONS: Aspirin 325 mg p.o. daily, Coreg 3.125 mg p.o. daily, furosemide 80 mg daily, hydrochlorothiazide25  mg daily, Nexium 40 mg daily, Mucinex 1 tablet at bedtime, NovoLog FlexPen  15> units at bedtime and 25 units in the morning, Sensipar 30 mg in the evening, Renal Caps 1 capsule . The patient told me that in his medications he takes hydralazine100 > mg t.i.d. that he started recently.   for bp  PAST SURGICAL HISTORY: Significant for AV fistula access in the left arm.   FAMILY HISTORY: No hypertension or diabetes.   REVIEW OF SYSTEMS:  CONSTITUTIONAL: The patient feels no fever or fatigue.  EYES: No blurred vision.  ENT: No tinnitus. No epistaxis. No difficulty swallowing.  RESPIRATORY: The patient complains of a cough for 3 weeks. Denies chest pain. Has trouble breathing on and off.   CARDIOVASCULAR: No chest pain. No orthopnea. The patient does have pedal edema. No palpitations. No syncope.  GASTROINTESTINAL: The patient complains of constipation for 2 weeks. No abdominal pain. No nausea. The patient complains of loss of appetite for 2 weeks.  GENITOURINARY: He is on dialysis.  ENDOCRINE: He has a history of diabetes and diabetic nephropathy.  HEMATOLOGIC: No anemia, but the patient has anemia of chronic kidney disease.  MUSCULOSKELETAL: No joint pain.  NEUROLOGIC: No numbness or weakness.  PSYCHIATRIC: No anxiety or insomnia.   PHYSICAL EXAMINATION:  VITAL SIGNS: Temperature 98.7, heart rate 102, blood pressure 108/50, O2 saturations initially the nurse told me he had like 60% on room air but the triage note says it was 80% on room air. The patient right now is on 55% Venti mask, and saturation is 90%.  GENERAL: He is an alert, awake, oriented, obese male, not in distress, answering questions appropriately, wants to take off the mask.  HEENT: Head: Normocephalic, atraumatic. Eyes: Pupils equal and reacting to light. Extraocular movements are intact. ENT: No tympanic membrane congestion. No turbinate hypertrophy. No oropharyngeal erythema.  NECK: Supple. No JVD. No carotid bruit.  RESPIRATORY: Bilaterally decreased breath sounds in the bases but no rales. No  wheezing. Not using accessory muscles for sedation.  CARDIOVASCULAR: S1, S2 regular. No murmurs. The patient has 2+ pedal edema present up to the knees. The patient's pulses are equal in the carotid, femoral, and dorsalis pedis.  GASTROINTESTINAL: Abdomen is soft, nontender, nondistended. No tenderness. The patient's bowel sounds are present.  MUSCULOSKELETAL: Able to move extremities x4.  SKIN: Warm with good skin turgor.  NEUROLOGIC: Alert, awake and oriented. Cranial nerves II through XII intact. Power 5/5 in the upper and lower extremities. Sensation intact. DTRs 2+ bilaterally.  PSYCHIATRIC: Mood and affect are within normal limits.   LABORATORY DATA: The patient's electrolytes show a sodium of 134, potassium 4.1, chloride 92, bicarbonate 31, BUN 51, creatinine are 12.5, glucose 203. LFTs within normal limits. BNP 13,637. WBC 17.8, hemoglobin 7.8, hematocrit 25.3, platelets 323,000. INR 1.52, troponin 0.02.   CHEST X-RAY: Shows congestive heart failure with pulmonary edema and small pleural effusions.   ASSESSMENT AND PLAN:  1.  The patient is a 59 year old male patient with acute hypoxic respiratory failure due to a combination of congestive heart failure and also pneumonia. The patient does have pulmonary edema, and he is due for hemodialysis today. We will consult nephrology, Dr. Thedore Mins, to arrange for dialysis today. Continue his Lasix until then and also continue oxygen support with Venti mask.  2.  Pneumonia. The patient has clinical evidence of pneumonia with a white count and cough for 2 weeks. The patient is on oxygen at this time, and he is on vancomycin and Levaquin. Follow blood cultures and sputum cultures.  3.  Diabetes mellitus type 2. The patient has diabetic nephropathy. He takes NovoLog FlexPen >15 units at bedtime and 25 units in the morning. Start him on dialysis. Diet. 4.  Hypertension. The patient's blood pressure is better controlled. He is on Coreg furosemide and  hydrochlorothiazide. Continue those medications and watch blood pressure. If blood pressure is still high, we will start him on hydralazine. At this time, I do not see the need for it.  5.  Gastrointestinal prophylaxis. PPIs.  6.  DVT prophylaxis with Lovenox.   TIME SPENT ON HISTORY AND PHYSICAL: 60 minutes.    ____________________________ Katha Hamming, MD sk:JT D: 08/27/2014 09:15:35 ET T: 08/27/2014 09:53:42 ET JOB#: [id number]  cc: Katha Hamming, MD, <Dictator> Katha Hamming MD ELECTRONICALLY SIGNED 08/27/2014 23:03

## 2015-01-04 NOTE — Consult Note (Signed)
PATIENT NAMLuvenia [name]:  [name], [name] MR#:  [id number] DATE OF BIRTH:  June 09, 1956  DATE OF CONSULTATION:  09/02/2014  REFERRING PHYSICIAN:  Santina Evansatherine P. Clent RidgesWalsh, MD CONSULTING PHYSICIAN:  Marcina MillardAlexander Shontavia Mickel, MD  CHIEF COMPLAINT:  Shortness of breath.  REASON FOR CONSULTATION: Requested for evaluation of systolic congestive heart failure.   HISTORY OF PRESENT ILLNESS: The patient is a 59 year old gentleman with history of hypertension, diabetes, end-stage renal disease, on chronic hemodialysis, who was admitted on 08/27/2014 with hypoxia and pneumonia. Chest x-ray revealed evidence for right upper lobe pneumonia as well as pulmonary edema. The patient has been treated with intravenous antibiotics with overall gradual improvement. The patient denies history of chest pain, but does have chronic peripheral edema. Echocardiogram was performed earlier today, which did reveal a moderate left ventricular dysfunction with LVEF of 30%-35%. The patient denies prior history of myocardial infarction.   PAST MEDICAL HISTORY: 1.  End-stage renal disease, on chronic hemodialysis.  2.  Hypertension.  3.  Hyperlipidemia.  4.  Diabetes.  5.  Diabetic nephropathy.   MEDICATIONS: Aspirin 325 mg daily, carvedilol 3.125 mg daily, furosemide 80 mg daily, hydrochlorothiazide 25 mg daily, Nexium 40 mg daily Mucinex 1 at bedtime, Sensipar 30 mg q. p.m., renal caps 1 capsule.   SOCIAL HISTORY: The patient denies current tobacco abuse.   FAMILY HISTORY: No immediate family history of coronary artery disease or myocardial infarction.     REVIEW OF SYSTEMS:  CONSTITUTIONAL: No fever or chills.  EYES: No blurry vision.  EARS: No hearing loss.  RESPIRATORY: Shortness of breath as described above.  CARDIOVASCULAR: No chest pain.  GASTROINTESTINAL: No nausea, vomiting, or diarrhea.  GENITOURINARY: No dysuria or hematuria.  ENDOCRINE: No polyuria or polydipsia.  MUSCULOSKELETAL: No arthralgias or myalgias.  NEUROLOGICAL: No  focal muscle weakness or numbness.  PSYCHOLOGICAL: No depression or anxiety.   PHYSICAL EXAMINATION: VITAL SIGNS: Blood pressure 144/75, pulse 91, respirations 17, temperature 97.8, pulse oximetry 97%.  HEENT: Pupils equal, reactive to light and accommodation.  NECK: Supple without thyromegaly.  LUNGS: Clear.  CARDIOVASCULAR:  Normal JVP. Diffuse PMI. Regular rate and rhythm. Normal S1, S2. No appreciable gallop, murmur, or rub.  ABDOMEN: Soft and nontender. Pulses were intact bilaterally.  MUSCULOSKELETAL: Normal muscle tone.  NEUROLOGIC: The patient is alert and oriented x 3. Motor and sensory both grossly intact.   IMPRESSION: A 59 year old gentleman with end-stage renal disease, on chronic hemodialysis, admitted with probable pneumonia and underlying congestive heart failure with moderate reduced left ventricular function.   RECOMMENDATIONS: 1.  Agree with overall current therapy.  2.  Increase carvedilol to 6.25 mg b.i.d.  3.  Consider adding low-dose hydralazine and isosorbide mononitrate.  4.  Defer further cardiac diagnostics at this time.   ____________________________ Marcina MillardAlexander Emanuell Morina, MD ap:LT D: 09/02/2014 17:31:27 ET T: 09/02/2014 18:18:43 ET JOB#: [id number]  cc: Marcina MillardAlexander Rhen Kawecki, MD, <Dictator> Marcina MillardALEXANDER Carlee Vonderhaar MD ELECTRONICALLY SIGNED 09/10/2014 13:12

## 2015-01-04 NOTE — Discharge Summary (Signed)
PATIENT NAMEAYOMIDE, [name] MR#:  [id number] DATE OF BIRTH:  [birth year]  DATE OF ADMISSION:  08/27/2014 DATE OF DISCHARGE:  09/10/2014  DISCHARGE DIAGNOSES:  1. Left arteriovenous graft infection with abscess, status post incision and drainage by [name] on December 21st and removal of brachial arteriovenous graft on December 23.  2. Status post permanent catheter placement and permanent tunneled dialysis catheter by [name] on  December 28th. 3. Acute respiratory failure with hypoxia due to acute diastolic heart failure and diastolic heart failure.  4. Acute combined systolic and diastolic heart failure, new diagnosis with Echo showing severely decreased ejection fraction of 30% to 35%.  5. End-stage renal disease on hemodialysis. 6. Anemia of chronic disease requiring 1 unit of packed red blood cell transfusion.   SECONDARY DIAGNOSES: As dictated by [name] in the interim discharge summary dictated on December 27th.  PROCEDURES AND RADIOLOGY:  1. As per [name] dictated interim summary dictated on December 22nd. Subsequently, the patient has had excision of left arm brachial dialysis graft and repair of the brachial artery using a patch.  2. A permanent tunneled dialysis catheter placement in the left jugular by [name] on December 28th. 3. laboratory panel as dictated by Dr. Clent Ridges on December 22nd. Subsequently, the patient had a wound culture on December 23rd, which had moderate WBCs and rare gram-positive cocci in pairs. Stool for Clostridium difficile was negative December 25th.   HISTORY OF PRESENT ILLNESS AND SHORT HOSPITAL COURSE: The patient is a 59 year old male with the above-mentioned medical problems who was admitted for shortness of breath and constipation, was found to have pneumonia with acute hypoxic respiratory failure in combination with heart failure. Please see [name] dictated history and physical for further details. Please also see Dr.  Jerrye [name] dictated interim discharge summary on December 22nd for detailed course from admission until December 22nd. Subsequently, the patient had his infected graft removal on December 23rd by [name]. The patient was subsequently dialyzed through a temporary catheter. He was improving. He was continued on inpatient hemodialysis as per a schedule from nephrology. He had a permanent tunneled dialysis catheter placement on December 28th by [name]. Infectious disease consultation was obtained with [name] for Enterococcal bacteremia, who recommended 4 weeks of IV vancomycin, dosed at hemodialysis, which was set up by care management. The patient was feeling much better, was close to his baseline, was discharged home on December 29th in stable condition.  VITAL SIGNS: On the date of discharge, his vital signs are as follows: Temperature 98.1, heart rate 94 per minute, respirations 19 per minute, blood pressure 145/81. He was saturating 97% on room air.   PERTINENT PHYSICAL EXAMINATION:  On the day of discharge:  CARDIOVASCULAR: S1, S2 normal. No murmurs.  LUNGS: Clear to auscultation bilaterally. No wheezing, rales, rhonchi, or crepitation.  ABDOMEN: Soft, benign.  NEUROLOGIC: Nonfocal examination.   EXTREMITIES: He had trace edema, now has a left upper extremity AV graft, which was covered with wound VAC, with minimal induration and tenderness. His left jugular area had a tunneled hemodialysis catheter in place. No signs of infection. His right groin catheter was removed with a pressure dressing applied on the date of discharge. Other physical examination remained at baseline.   DISCHARGE MEDICATIONS:  Medication Instructions  bayer aspirin regimen 325 mg oral enteric coated tablet  1 tab(s) orally once a day (in the morning)    renal caps oral capsule  1  cap(s) orally once a day   sensipar 30 mg oral tablet  1 tab(s) orally once a day (in the evening)   nexium 40 mg  oral delayed release capsule  1 cap(s) orally once a day   mucinex d 600 mg-60 mg oral tablet, extended release  1 tab(s) orally once a day (at bedtime)   tums 1000 mg oral tablet, chewable  3 tab(s) orally 2 times a day   carvedilol 3.125 mg oral tablet  1 tab(s) orally once a day (at bedtime)   novolog mix 70/30 flexpen 30 units-70 units/ml subcutaneous suspension  15 unit(s) subcutaneous once a day (in the evening)   novolog mix 70/30 flexpen 30 units-70 units/ml subcutaneous suspension  25 unit(s) subcutaneous once a day (in the morning)   furosemide 40 mg oral tablet  1 tab(s) orally once a day   isosorbide mononitrate 30 mg oral tablet, extended release  1 tab(s) orally once a day   vancomycin  1000 milligram(s) iv 3 times a week with each Hemodialysis    lisinopril 2.5 mg oral tablet  1 tab(s) orally once a day   aldactone 25 mg oral tablet  1 tab(s) orally once a day   oxycodone 10 mg oral tablet  1 tab(s) orally every 6 hours, As Needed - for Pain    DISCHARGE DIET: Dialysis diet.   DISCHARGE ACTIVITY: As tolerated.   DISCHARGE INSTRUCTIONS AND FOLLOWUP: The patient was instructed to follow up with her primary care physician, [name] in 1-2 weeks. He will need follow-up with Dr. Clydie Braunavid [name] in 2-4 weeks. He will also need to [name] 4-6 weeks. He was scheduled to have follow up with heart failure clinic here at [facility] on January 13th at 11:00 a.m. He will get outpatient hemodialysis schedule.   TOTAL TIME SPENT ON THIS PATIENT: 45 minutes.   Please send a copy of interim discharge summary dictated by Dr. Elby Showersatherine Walsh on December 22nd.   Please note, this patient remains at very high risk for readmission.   ____________________________ Ellamae SiaVipul S. Sherryll BurgerShah, MD vss:mw D: 09/11/2014 16:37:22 ET T: 09/11/2014 17:20:16 ET JOB#: [id number]  cc: Jenilyn Magana S. Sherryll BurgerShah, MD, <Dictator> Owens Sharkaran K. Thedore MinsSingh, MD Stann Mainlandavid P. Sampson GoonFitzgerald,  MD [facility] Mun[place]or Lizabeth LeydenN. Lateef, MD Annice NeedyJason S. Dew, MD  Ellamae SiaVIPUL S [facility] MD ELECTRONICALLY SIGNED 09/11/2014 20:45

## 2015-01-04 NOTE — Consult Note (Signed)
CHIEF COMPLAINT and HISTORY:  Subjective/Chief Complaint SOB/hypoxia   History of Present Illness Patient admitted two days ago with severe hypoxia and shortness of breath. This had been progressive over several days.  No associated or causative factors.  has gotten better with Abx.  Was found to have severe pneumonia and bactermia resultant from that.  Had some hypotension as well and was noted to have clotted his access after dialysis Tuesday evening.  He is feeling better and breathing better.  Out on the floor now.  But needs an access for dialysis as his graft has clotted.   PAST MEDICAL/SURGICAL HISTORY:  Past Medical History:   infected dialysis graft:    Dialysis  (Tues, Thurs, Sat.):    Renal Failure:    htn:    Diabetes:    Dialysis graft left upper arm 12/2010:    AV Fistula removal:    IJ catheter:    right knee cartilage removed:   ALLERGIES:  Allergies:  Penicillin: Hives  HOME MEDICATIONS:  Home Medications: Medication Instructions Status  Bayer Aspirin Regimen 325 mg oral enteric coated tablet 1 tab(s) orally once a day (in the morning)  Active  NovoLog FlexPen 100 units/mL subcutaneous solution 25 unit(s) subcutaneous once a day (in the morning) Active  NovoLog FlexPen 100 units/mL subcutaneous solution 15 unit(s) subcutaneous once a day (at bedtime) Active  Renal Caps oral capsule 1 cap(s) orally once a day Active  Sensipar 30 mg oral tablet 1 tab(s) orally once a day (in the evening) Active  hydrochlorothiazide 25 mg oral tablet 1 tab(s) orally once a day Active  furosemide 80 mg oral tablet 1 tab(s) orally once a day Active  Nexium 40 mg oral delayed release capsule 1 cap(s) orally once a day Active  Mucinex D 600 mg-60 mg oral tablet, extended release 1 tab(s) orally once a day (at bedtime) Active  Tums 1000 mg oral tablet, chewable 3 tab(s) orally 2 times a day Active  carvedilol 3.125 mg oral tablet 1 tab(s) orally once a day (at bedtime) Active    Family and Social History:  Family History Non-Contributory   Social History negative tobacco, negative ETOH, negative Illicit drugs   Place of Living Home   Review of Systems:  Subjective/Chief Complaint No TIA/stroke/seizure No heat or cold intolerance No dysuria/hematuria No blurry or double vision No tinnitus or ear pain No rashes or ulcer   Fever/Chills Yes   Cough Yes   Sputum Yes   Abdominal Pain No   Diarrhea No   Constipation No   Nausea/Vomiting No   SOB/DOE Yes   Chest Pain No   Telemetry Reviewed NSR   Dysuria ESRD   Tolerating PT Yes   Tolerating Diet Yes   Medications/Allergies Reviewed Medications/Allergies reviewed   Physical Exam:  GEN well developed, well nourished, no acute distress   HEENT pink conjunctivae, moist oral mucosa   NECK No masses  trachea midline   RESP normal resp effort  no use of accessory muscles  on supplemental oxygen   CARD regular rate  no JVD   VASCULAR ACCESS AV graft present  No bruit, no thrill  left arm   ABD denies tenderness  soft   GU no superpubic tenderness   LYMPH negative neck, negative axillae   EXTR negative cyanosis/clubbing, positive edema   SKIN normal to palpation, skin turgor good   NEURO cranial nerves intact, follows commands, motor/sensory function intact   PSYCH alert, A+O to time, place, person  LABS:  Laboratory Results: Hepatic:    15-Dec-15 07:01, Comprehensive Metabolic Panel  Bilirubin, Total 0.8  Alkaline Phosphatase 77  46-116  NOTE: New Reference Range  04/02/14  SGPT (ALT) 14  14-63  NOTE: New Reference Range  04/02/14  SGOT (AST) 17  Total Protein, Serum 7.8  Albumin, Serum 2.4  Routine Micro:    15-Dec-15 07:01, Blood Culture  Organism Name   Enterococcus faecalis  Ampicillin Sensitivity S  Linezolid Sensitivity S  Micro Text Report   BLOOD CULTURE    ORGANISM 1                Enterococcus faecalis    COMMENT                   IN AEROBIC  AND ANAEROBIC BOTTLES    GRAM STAIN                GRAM POSITIVE COCCI IN CHAINS     ANTIBIOTIC                    ORG#1    ORG#2      AMPICILLIN                   S                   LINEZOLID                     S  Organism 1   Enterococcus faecalis  Culture Comment   IN AEROBIC AND ANAEROBIC BOTTLES  Culture Comment .   ID TO FOLLOW SENSITIVITIES TO FOLLOW  Gram Stain 1   GRAM POSITIVE COCCI IN CHAINS    15-Dec-15 08:19, Blood Culture  Organism Name   Enterococcus faecalis  Ampicillin Sensitivity S  Linezolid Sensitivity S  Micro Text Report   BLOOD CULTURE    ORGANISM 1                Enterococcus faecalis    COMMENT                   IN AEROBIC AND ANAEROBIC BOTTLES    GRAM STAIN                GRAM POSITIVE COCCI IN CHAINS     ANTIBIOTIC                    ORG#1    ORG#2      AMPICILLIN                   S                   LINEZOLID                     S  Organism 1   Enterococcus faecalis  Culture Comment   IN AEROBIC AND ANAEROBIC BOTTLES  Culture Comment .   ID TO FOLLOW SENSITIVITIES TO FOLLOW  Gram Stain 1   GRAM POSITIVE COCCI IN CHAINS    16-Dec-15 13:39, Clostridium Difficile  Micro Text Report   CLOSTRIDIUM DIFFICILE    C.DIFFICILE ANTIGEN       C.DIFFICILE GDH ANTIGEN : NEGATIVE    C.DIFFICILE TOXIN A/B     C.DIFFICILE TOXINS A AND B : NEGATIVE    INTERPRETATION            Negative  for C. difficile.      ANTIBIOTIC    17-Dec-15 06:40, Blood Culture  Micro Text Report   BLOOD CULTURE    COMMENT                   NO GROWTH IN 8-12 HOURS     ANTIBIOTIC  Culture Comment   NO GROWTH IN 8-12 HOURS   Result(s) reported on 29 Aug 2014 at 02:00PM.    17-Dec-15 06:45, Blood Culture  Micro Text Report   BLOOD CULTURE    COMMENT                   NO GROWTH IN 8-12 HOURS     ANTIBIOTIC  Culture Comment   NO GROWTH IN 8-12 HOURS   Result(s) reported on 29 Aug 2014 at 02:00PM.  Cardiology:    15-Dec-15 08:37, ECG  Ventricular Rate 100  Atrial Rate  100  P-R Interval 196  QRS Duration 96  QT 336  QTc 433  P Axis 40  R Axis 18  T Axis 125  ECG interpretation   Normal sinus rhythm  Nonspecific T wave abnormality  Abnormal ECG  When compared with ECG of 24-Dec-2011 08:48,  Nonspecific T wave abnormality now evident in Inferior leads  Nonspecific T wave abnormality now evident in Lateral leads  ----------unconfirmed----------  Confirmed by OVERREAD, NOT ([id number]), editor PEARSON, BARBARA ([id number]) on 08/28/2014 11:57:06 AM  ECG   Routine Chem:    15-Dec-15 07:01, Blood Culture  Result Comment   AER/ANA - GRAM POSITIVE COCCI/C-LANCE LANSANA   - 08-27-14/2225/RWW   - NOTIFIED OF CRITICAL VALUE   - READ-BACK PROCESS PERFORMED.   Result(s) reported on 29 Aug 2014 at 08:37AM.    15-Dec-15 07:01, B-Type Natriuretic Peptide [facility])  B-Type Natriuretic Peptide [facility]) [phone]  Result(s) reported on 27 Aug 2014 at [facility].    15-Dec-15 07:01, Comprehensive Metabolic Panel  Glucose, Serum 203  BUN 51  Creatinine (comp) 12.55  Sodium, Serum 134  Potassium, Serum 4.1  Chloride, Serum 92  CO2, Serum 31  Calcium (Total), Serum 10.3  Osmolality (calc) 288  eGFR (African American) 5  eGFR (Non-African American) 4  eGFR values <38m/min/1.73 m2 may be an indication of chronic  kidney disease (CKD).  Calculated eGFR, using the MRDR Study equation, is useful in   patients with stable renal function.  The eGFR calculation will not be reliable in acutely ill patients  when serum creatinine is changing rapidly. It is not useful in  patients on dialysis. The eGFR calculation may not be applicable  to patients at the low and high extremes of body sizes, pregnant  women, and vegetarians.  Anion Gap 11    15-Dec-15 08:19, Blood Culture  Result Comment   AER/ANA BOTTLE - GRAM POSITIVE COCCI/C-LANCE LANSANA   - 08-27-14/2225/RWW   - NOTIFIED OF CRITICAL VALUE   - READ-BACK PROCESS PERFORMED.   Result(s) reported on 29 Aug 2014 at 08:47AM.     15-Dec-15 18:00, Phosphorus, Serum  Phosphorus, Serum 6.5  Result(s) reported on 27 Aug 2014 at 07:19PM.  Cardiac:    15-Dec-15 07:01, Cardiac Panel  CK, Total 158  CPK-MB, Serum 0.7  Result(s) reported on 27 Aug 2014 at [facility]    15-Dec-15 07:01, Troponin I  Troponin I 0.02  0.00-0.05  0.05 ng/mL or less: NEGATIVE   Repeat testing in 3-6 hrs   if clinically indicated.  >0.05 ng/mL: POTENTIAL   MYOCARDIAL INJURY. Repeat   testing  in 3-6 hrs if   clinically indicated.  NOTE: An increase or decrease   of 30% or more on serial   testing suggests a   clinically important change  Routine Coag:    15-Dec-15 07:01, Prothrombin Time  Prothrombin 17.8  INR 1.5  INR reference interval applies to patients on anticoagulant therapy.  A single INR therapeutic range for coumarins is not optimal for all  indications; however, the suggested range for most indications is  2.0 - 3.0.  Exceptions to the INR Reference Range may include: Prosthetic heart  valves, acute myocardial infarction, prevention of myocardial  infarction, and combinations of aspirin and anticoagulant. The need  for a higher or lower target INR must be assessed individually.  Reference: The Pharmacology and Management of the Vitamin K   antagonists: the seventh ACCP Conference on Antithrombotic and  Thrombolytic Therapy. HYWVP.7106 Sept:126 (3suppl): [id number].  A HCT value >55% may artifactually increase the PT.  In one study,   the increase was an average of 25%.  Reference:  "Effect on Routine and Special Coagulation Testing Values  of Citrate Anticoagulant Adjustment in Patients with High HCT Values."  American Journal of Clinical Pathology 2006;126:400-405.  Routine Hem:    15-Dec-15 07:01, Hemogram, Platelet Count  WBC (CBC) 17.8  RBC (CBC) 2.89  Hemoglobin (CBC) 7.8  Hematocrit (CBC) 25.3  Platelet Count (CBC) 323  Result(s) reported on 27 Aug 2014 at 07:47AM.  MCV 88  MCH 26.9  MCHC 30.7  RDW 17.0     16-Dec-15 06:28, CBC Profile  WBC (CBC) 11.9  RBC (CBC) 3.15  Hemoglobin (CBC) 8.5  Hematocrit (CBC) 27.6  Platelet Count (CBC) 317  MCV 88  MCH 26.9  MCHC 30.6  RDW 17.0  Neutrophil % 72.1  Lymphocyte % 9.3  Monocyte % 13.0  Eosinophil % 5.2  Basophil % 0.4  Neutrophil # 8.6  Lymphocyte # 1.1  Monocyte # 1.5  Eosinophil # 0.6  Basophil # 0.1  Result(s) reported on 28 Aug 2014 at 07:07AM.   RADIOLOGY:  Radiology Results: XRay:    08-Jun-15 09:30, KUB - Kidney Ureter Bladder  KUB - Kidney Ureter Bladder  REASON FOR EXAM:    Nephrolithasis Renal Colic  COMMENTS:       PROCEDURE: DXR - DXR KIDNEY URETER BLADDER  - Feb 18 2014  9:30AM     CLINICAL DATA:  History of kidney stones.    EXAM:  ABDOMEN - 1 VIEW    COMPARISON:  CT abdomen 10/18/2012    FINDINGS:  The bowel gas pattern is normal. No radio-opaque calculi or other  significant radiographic abnormality are seen. Prominent  calcification in the vas deferens bilaterally.     IMPRESSION:  No renal calculi identified.      Electronically Signed   By: Franchot Gallo M.D.    On: 02/18/2014 09:31         Verified By: Truett Perna, M.D.,    09-Sep-15 12:07, Chest PA and Lateral  Chest PA and Lateral  REASON FOR EXAM:    Cough, Fever  COMMENTS:       PROCEDURE: DXR - DXR CHEST PA (OR AP) AND LATERAL  - May 22 2014 12:07PM     CLINICAL DATA:  Cough, fever    EXAM:  CHEST  2 VIEW    COMPARISON:  None.    FINDINGS:  Bilateral diffuse interstitial thickening and peribronchial cuffing  most concerning for bronchitis. There is no focal parenchymal  opacity, pleural effusion, or pneumothorax. The heart and  mediastinal contours are unremarkable.    The osseous structures are unremarkable.     IMPRESSION:  Interstitial thickening and peribronchial cuffing most concerning  for bronchitis.      Electronically Signed    By: Kathreen Devoid    On: 05/22/2014 15:21       Verified By: Jennette Banker,  M.D., MD    15-Dec-15 06:59, Chest Portable Single View  Chest Portable Single View  REASON FOR EXAM:    DYSPNEA COUGH, HYPOXIA  COMMENTS:       PROCEDURE: DXR - DXR PORTABLE CHEST SINGLE VIEW  - Aug 27 2014  6:59AM     CLINICAL DATA:  Shortness of breath.    EXAM:  PORTABLE CHEST - 1 VIEW    COMPARISON:  05/22/2014.    FINDINGS:  Mediastinum and hilar structures are unremarkable. Severe  cardiomegaly with pulmonary vascular prominence bilateral pulmonary  alveolar infiltrates noted consistent congestive heart failure.  Bilateral small pleural effusions arch noted. No pneumothorax.     IMPRESSION:  Congestive heart failure with pulmonary edema and small bilateral  pleural effusions.      Electronically Signed    By: Marcello Moores  Register    On: 08/27/2014 08:34         Verified By: Osa Craver, M.D., MD  LabUnknown:    08-Jun-15 09:30, KUB - Kidney Ureter Bladder  PACS Image    09-Sep-15 12:07, Chest PA and Lateral  PACS Image    15-Dec-15 06:59, Chest Portable Single View  PACS Image   ASSESSMENT AND PLAN:  Assessment/Admission Diagnosis ESRD, clotted graft bactermia and pneumonia HTn, other medical issues   Plan given his bacteremia, it is not really safe to try to declot his graft now.  Will need a temp cath for dialysis for next couple of treatments.  Should be noted that this was planned yesterday but patient declined to have it placed, so it is being placed today.  Would consider declot next week if his bacteremia has resolved.  May be helpful to repeat cultures tomorrow or Saturday.       level 4 consult   Electronic Signatures: Algernon Huxley (MD)  (Signed 17-Dec-15 16:31)  Authored: Chief Complaint and History, PAST MEDICAL/SURGICAL HISTORY, ALLERGIES, HOME MEDICATIONS, Family and Social History, Review of Systems, Physical Exam, LABS, RADIOLOGY, Assessment and Plan   Last Updated: 17-Dec-15 16:31 by Algernon Huxley (MD)

## 2015-01-05 NOTE — Op Note (Signed)
PATIENT NAMLuvenia Redden:  Mena, Rodrecus MR#:  [id number] DATE OF BIRTH:  Dec 01, 1955  DATE OF PROCEDURE:  10/08/2011  PREOPERATIVE DIAGNOSES:  1. Complication AV dialysis device.  2. Endstage renal disease requiring hemodialysis.   POSTOPERATIVE DIAGNOSES:  1. Complication AV dialysis device.  2. Endstage renal disease requiring hemodialysis.   PROCEDURES PERFORMED:  1. Contrast injection left arm brachial axillary dialysis graft.  2. Stent placement left arm brachial axillary dialysis graft.   SURGEON:  Levora DredgeGregory Tacora Athanas, M.D.   SEDATION: Versed 3 mg plus fentanyl 100 mcg administered IV. Continuous ECG, pulse oximetry and cardiopulmonary monitoring was performed throughout the entire procedure by the interventional radiology nurse. Total sedation time was 50 minutes.   ACCESS: 7 French sheath antegrade left arm brachial axillary dialysis graft.   CONTRAST USED: Isovue 25 mL.   FLUOROSCOPY TIME: 1.6 minutes.   INDICATIONS: [name] who presents with increasing difficulties with cannulation, particularly the arterial needle. Dialysis center has called us stating they have been pulling clots and are having an increased bleeding time from the arterial puncture site. Physical examination as well as noninvasive studies demonstrated stricture within the midportion of the brachial axillary graft just beyond the area of arterial cannulation consistent with these problems and he is therefore undergoing angiography to evaluate this possibility and further treat. Risks and benefits were reviewed. All questions are answered. The patient has agreed to proceed.   DESCRIPTION OF PROCEDURE: The patient is taken to Special Procedures and placed in the supine position with the left arm extended palm upward. The left arm is prepped and draped in a sterile fashion. 1% lidocaine is infiltrated into the soft tissues overlying the palpable graft near the arterial anastomosis. Micropuncture needle  followed by microwire, micro sheath, J-wire followed by a 6 French sheath are then inserted. Hand injection of contrast is utilized to demonstrate the anatomy of the graft as well as the central venous anatomy. After review of these images, 3000 units of heparin is given.   A Magic torque wire is then advanced so that there is adequate purchase. There is a high-grade stenosis at the leading edge of the previously placed fluency stent, which is just beyond the area of arterial cannulation consistent with the patient's history. An 8 x 40 straight FLAIR stent is then opened and prepped on the field. The 6 French sheath is removed and the FLAIR is advanced across the lesion.  It is opened without difficulty. It is post dilated to 16 atmospheres with an 8 x 4 conquest balloon. Follow-up imaging demonstrates complete resolution of the stricture with excellent apposition of the stent.   After deployment of the FLAIR it was a 7 [place] sheath that was advanced over the wire.   4-0 Monocryl is then placed in a pursestring around the 7 [place] sheath.  The 7 French sheath is removed.  The sutures are secured and there are no immediate complications.   INTERPRETATION: Initial images of the left arm brachial axillary dialysis graft show greater than 90% stricture at the leading edge of the of the FLAIR stents in the mid AV graft. The remaining stented segment is widely patent as is the interface between the most proximal edge of the stent and the axillary vein. Actually, subclavian and superior vena cava are widely patent. Arterial anastomosis is widely patent. Following angioplasty with stent placement, there is complete resolution of the stricture and good opposition of the stent to the wall.   SUMMARY: Successful angioplasty  and stent placement for graft salvage left arm brachial axillary dialysis graft.   ____________________________ Renford Dills, MD ggs:bjt D: 10/09/2011 14:06:06 ET T: 10/09/2011  14:16:11 ET JOB#: [id number]  cc: Renford Dills, MD, <Dictator> Renford Dills MD ELECTRONICALLY SIGNED 10/27/2011 11:09

## 2015-01-05 NOTE — Op Note (Signed)
PATIENT NAMEKYSON, [name] MR#:  [id number] DATE OF BIRTH:  09-15-1955  DATE OF PROCEDURE:  12/10/2011  PREOPERATIVE DIAGNOSES:  1. Thrombosis AV graft, left arm.  2. Complication of AV graft with ulceration of the skin.  3. End-stage renal disease requiring hemodialysis.   POSTOPERATIVE DIAGNOSES:   1. Thrombosis AV graft, left arm.  2. Complication of AV graft with ulceration of the skin.  3. End-stage renal disease requiring hemodialysis.   PROCEDURES PERFORMED:  1. Contrast injection left arm AV graft.  2. Percutaneous transluminal angioplasty midportion AV graft.  3. Mechanical thrombectomy with Trerotola.   SURGEON: Renford Dills, MD  SEDATION: Versed 4 mg plus fentanyl 300 mcg administered IV. Continuous ECG, pulse oximetry and cardiopulmonary monitoring is performed throughout the entire procedure by the interventional radiology nurse. Total sedation time is 45 minutes.   ACCESS:  1. 7 French sheath left arm brachial axillary dialysis graft, antegrade direction.  2. 6 French sheath left arm brachial axillary dialysis graft, retrograde direction.   CONTRAST USED: Isovue 30 mL.   FLUORO TIME: Approximately three minutes.   INDICATIONS: [name] is a 59 year old gentleman who presented to dialysis yesterday with thrombosis of his AV graft and could not receive his therapy. He is therefore undergoing evaluation today with the hope for salvage of his AV graft. Risks and benefits were reviewed. All questions are answered. Patient agrees to proceed.   DESCRIPTION OF PROCEDURE: Patient is taken to special procedures, placed in supine position. After adequate sedation is achieved, left arm is extended palm upward and prepped and draped in a sterile fashion. 1% lidocaine is infiltrated in the soft tissues overlying the graft near the arterial anastomosis in an antegrade direction micropuncture needle is inserted, microwire followed by micro sheath, J-wire followed by a 6 French  sheath. Hand injection of contrast is used to verify thrombus within the graft. KMP Magic torque wire is advanced into the axillary vein where central venous imaging is obtained to demonstrate this is patent. 4000 units of heparin is given. Trerotola device is opened onto the field and the venous portion of the graft is cleared of thrombus with the Trerotola device; three passes were made.   Beginning at the level of the deltoid overlying the palpable graft lidocaine is infiltrated, micropuncture needle followed by microwire, J-wire followed by 6 French sheath in a retrograde direction. Trerotola device is then introduced, advanced into the brachial artery. The basket is opened but not engaged and then pulled back into the graft itself. Two passes through the arterial portion is made. Follow-up angiography demonstrates there is rapid flow of contrast and a greater than 80% stenosis at the leading edge of the stent in the midportion of the graft. Magic torque wire is then reintroduced through the antegrade sheath and an 8 x 4 Conquest balloon is used to treat this area. Result is inadequate. Sheath is upsized to a 7 [place] sheath and a 9 x 4 balloon is used to treat this area. Results after the 9 inflation are excellent with less than 5% residual stenosis, however, after inflation in this area the previously noted ulcer does appear to have a small amount of bleeding, just literally a drop or two. There is no significant bleeding noted but it does remain worrisome that this area has not quite and completely healed. There is no surrounding erythema, induration. There is no purulent drainage noted. The patient has been feeling well and does not have any systemic symptoms.  Follow-up angiography demonstrates the graft is cleared. Central venous anatomy is widely patent.   INTERPRETATION: Thrombus in the graft as noted above. Following clearing of the thrombus there is one area that is stenosed at the leading edge  of previously implanted stent. Following treatment of this there is resolution. Concerns regarding the skin changes and possible full thickness ulceration as noted above. This will be followed and the patient will follow up within 7 to 10 days in the office.  ____________________________ Renford DillsGregory G. Schnier, MD ggs:cms D: 12/10/2011 08:53:42 ET T: 12/11/2011 11:07:27 ET  JOB#: [id number] cc: Mosetta PigeonHarmeet Singh, MD Munsoor Lizabeth LeydenN. Lateef, MD Renford DillsGREGORY G SCHNIER MD ELECTRONICALLY SIGNED 12/15/2011 9:01

## 2015-01-05 NOTE — Op Note (Signed)
PATIENT NAMLuvenia [name]:  Shark, [name] MR#:  [id number] DATE OF BIRTH:  12-11-1955  DATE OF PROCEDURE:  11/19/2011  PREOPERATIVE DIAGNOSES:  1. Massive hemorrhage from left arm brachial axillary dialysis graft.  2. End-stage renal disease requiring hemodialysis.  3. Complication arteriovenous dialysis device.   POSTOPERATIVE DIAGNOSES:  1. Massive hemorrhage from left arm brachial axillary dialysis graft.  2. End-stage renal disease requiring hemodialysis.  3. Complication arteriovenous dialysis device.   PROCEDURES PERFORMED:  1. Contrast injection left arm arteriovenous graft.  2. Percutaneous transluminal angioplasty and stent placement using a Fluency Covered Stent left graft for prevention of hemorrhage.   SURGEON: Levora DredgeGregory Cierah Crader, MD   SEDATION: Versed 3 mg plus fentanyl 150 mcg administered IV. Continuous ECG, pulse oximetry and cardiopulmonary monitoring is performed throughout the entire procedure by the Interventional Radiology nurse. Total sedation time is one hour.   ACCESS: A 7-French sheath left arm AV graft, antegrade direction.   CONTRAST USED: Isovue 20 mL.   FLUOROSCOPY TIME: 1.3 minutes.   INDICATIONS: [name] who experienced a rather large hemorrhagic event following dialysis yesterday. He is, therefore, referred for evaluation. Ulceration of the skin is noted, and he is at significant risk for further ulceration via the pseudoaneurysm and defect in the graft. He is therefore undergoing placement of a Fluency Stent for prevention of life-threatening hemorrhage. The risks and benefits were reviewed. The patient agrees to proceed.   DESCRIPTION OF PROCEDURE: The patient is taken to Special Procedures and placed in the supine position with the left arm extended palm upward. The left arm is prepped and draped in a sterile fashion. Adequate sedation has been achieved.   A micropuncture needle followed by a microwire and micro sheath, J-wire followed by a  6-French sheath is inserted. Hand injection of contrast is used to demonstrate the graft in the central venous anatomy. As noted, there is an area of aneurysmal formation secondary to repetitive punctures and a defect within the graft. This is identified and correlated with the ulceration of the skin. Central veins are all widely patent. A Magic Torque Wire is then advanced, followed by an 8 x 40 Fluency Stent which is deployed without difficulty and postdilated to 8 mm. Follow-up angiography demonstrates resolution of this defect area. A 7-French sheath is placed for hemostasis. Pursestring suture is placed. The sheath and wire are removed, and there are no immediate complications.   INTERPRETATION: The graft itself appears to be free of hemodynamically significant stenoses. There is an area of erosion or early aneurysmal formation secondary to the defect in the graft. Central veins are all widely patent. Previously placed stents are widely patent. Following covering this area with the stent, there is resolution of this defect. The patient will undergo followup and likely will require revision of his graft.   ____________________________ Renford DillsGregory G. Nysir Fergusson, MD ggs:cbb D: 11/19/2011 16:24:55 ET T: 11/19/2011 16:53:53 ET JOB#: [id number]  cc: Renford DillsGregory G. Jie Stickels, MD, <Dictator> Munsoor Lizabeth LeydenN. Lateef, MD Serita ShellerErnest B. Maryellen PileEason, MD Renford DillsGREGORY G Rodriques Badie MD ELECTRONICALLY SIGNED 11/30/2011 12:21

## 2015-01-05 NOTE — Op Note (Signed)
PATIENT NAMEKAMAREE, [name] MR#:  [id number] DATE OF BIRTH:  [birth year]  DATE OF PROCEDURE:  12/31/2011  PREOPERATIVE DIAGNOSES:  1. Infected left brachial axillary dialysis graft.  2. End-stage renal disease requiring hemodialysis.  3. Complication AV dialysis device.  4. Superior vena cava syndrome.   POSTOPERATIVE DIAGNOSES: 1. Infected left brachial axillary dialysis graft.  2. End-stage renal disease requiring hemodialysis.  3. Complication AV dialysis device.  4. Superior vena cava syndrome.   PROCEDURES PERFORMED:  1. Creation of a left brachial axillary AV graft.  2. Excision of infected left arm AV graft.   PROCEDURE PERFORMED BY: Renford Dills, MD   ANESTHESIA: General by LMA.   FLUIDS: Per anesthesia record.   ESTIMATED BLOOD LOSS: 100 mL.   COMPLICATIONS: None.   INDICATIONS: [name] is a 59 year old gentleman who presented with bleed from an open ulceration of his left arm brachial axillary dialysis graft. He is, therefore, undergoing excision of this graft and placement of a graft around the perimeter of this existing graft. Risks and benefits were reviewed. All questions are answered. The patient agrees to proceed.   PROCEDURE: The patient is taken to the operating room and placed in the supine position. After adequate general anesthesia is induced and appropriate invasive monitors are placed, he is positioned supine with his left arm extended palm upward. Left arm is prepped and draped in a sterile fashion. Collier Flowers is then placed down over the entire arm.   Previous axillary and brachial artery incisional scars are then re-incised with a 15 blade scalpel and the dissection is carried down. The graft is easily identified at these two locations and dissected proximally and distally for a distance of several centimeters.   The Gore tunneling device is then used to pass subcutaneously in an arc approximately 3 cm wider than the previous existing graft.   The  arterial and venous ends of the grafts are then clamped. 0 Ethibond is used to ligate the graft above the clamp and the graft is then transected first at the arterial and then the venous side. A straight 6 mm Flixene instant stick graft is then pulled subcutaneously. End-to-end arterial anastomosis is fashioned with the Flixene graft and in a similar fashion end-to-end anastomosis is fashioned at the venous portion with the Flixene graft. CV-6 suture is used at the arterial and CV-4 suture is used at the venous. A segment of the old graft is then resected again ligating it with 0 Ethibond and then pushing it as far toward the middle as it would go and using multiple figure-of-eight 3-0 Vicryl sutures to close the tunnel. The incisional wounds are then closed in multiple layers using 3-0 Vicryl. The skin is closed with 4-0 Monocryl and Dermabond is applied and allowed to dry. Once the Dermabond is dried completely sealing these two incisions, the Ioban is removed. Three separate incisions are created overlying the infected graft and the graft is removed from the arm in two pieces. All wounds are irrigated, debrided, hemostasis obtained, and then they are closed loosely with 3-0 Vicryl subcutaneously and a few staples. Sterile dressing is applied. The patient tolerated the procedure well and there were no immediate complications. Sponge and needle counts were correct. He was taken to the recovery area in stable condition.   ____________________________ Renford Dills, MD ggs:drc D: 12/31/2011 17:09:28 ET T: 01/01/2012 10:31:13 ET JOB#: [id number]  cc: Renford Dills, MD, <Dictator> Serita Sheller. Maryellen Pile, MD Mosetta Pigeon, MD Latina Craver [facility]  MD ELECTRONICALLY SIGNED 01/02/2012 13:55

## 2015-01-08 NOTE — Op Note (Signed)
PATIENT NAMLuvenia [name]:  Aranas, Odus MR#:  [id number] DATE OF BIRTH:  [birth year]  DATE OF PROCEDURE:  09/02/2014  PREOPERATIVE DIAGNOSES: 1.  End-stage renal disease.  2.  Thrombosed left arm arteriovenous graft.  3.  Bacteremia.  4.  Hypertension.   POSTOPERATIVE DIAGNOSES: 1.  End-stage renal disease.  2.  Thrombosed left arm arteriovenous graft.  3.  Bacteremia.  4.  Hypertension.  5.  Abscess, left arm, around thrombosed arteriovenous graft.   PROCEDURES: Incision and drainage of left arm wound near arteriovenous graft.   SURGEON: Annice NeedyJason S. Eulises Kijowski, MD   ANESTHESIA: Local with moderate conscious sedation.   ESTIMATED BLOOD LOSS: Minimal.   INDICATION FOR PROCEDURE: A 59 year old gentleman who was admitted with bacteremia and pneumonia last week. His graft was thrombosed. He had a temporary catheter placed.  Our initial plan was to try to reopen his thrombosed graft today, if he was clinically improved. Risks and benefits were discussed, and he desired proceed. However, at prepping of the arm, it was found to have purulent material draining from an access site from the AV graft. This abscess clearly needed to be drained, and it was not safe to perform a declot procedure. I have discussed this with Dr. Cherylann RatelLateef, who agreed with our plan of care.   DESCRIPTION OF PROCEDURE: The patient's left arm was sterilely prepped and draped. Again, purulent material was identified, changing our plan on this procedure. The area with draining purulence was cultured, and a small skin incision was created after local anesthesia, and all purulent material was expressed. I did not clearly see the graft, although this was not an area overlying the graft. I then irrigated the wound and placed iodoform gauze, quarter inch, as a wet-to-dry dressing in the wound. A sterile dressing was placed. The patient tolerated the procedure well and was taken to the recovery room in stable condition .     ____________________________ Annice NeedyJason S. Dajane Valli, MD jsd:MT D: 09/02/2014 11:23:55 ET T: 09/02/2014 13:01:20 ET JOB#: [id number]  cc: Annice NeedyJason S. Shawn Dannenberg, MD, <Dictator> Annice NeedyJASON S Ciclaly Mulcahey MD ELECTRONICALLY SIGNED 09/16/2014 14:13

## 2015-01-08 NOTE — Op Note (Signed)
PATIENT NAMLuvenia [name]:  [name], [name] MR#:  [id number] DATE OF BIRTH:  1955-12-16  DATE OF PROCEDURE:  09/04/2014  PREOPERATIVE DIAGNOSES:  1.  Infected left arm brachial axillary dialysis graft.  2.  End-stage renal disease requiring hemodialysis.  POSTOPERATIVE DIAGNOSES: 1.  Infected left arm brachial axillary dialysis graft.  2.  End-stage renal disease requiring hemodialysis.   PROCEDURES PERFORMED:  Excision of left arm brachial axillary dialysis graft.  Repair of brachial artery using CorMatrix Xenograft patch.   SURGEON: Renford DillsGregory G. Tatiana Courter, MD   ANESTHESIA: General by LMA.   FLUIDS: Per anesthesia record.   ESTIMATED BLOOD LOSS: 100 mL   SPECIMEN:  1.  Culture of the purulent material to microbiology.  2.  Graft photographed for the permanent record.   INDICATIONS: Mr. [name] is a 59 year old gentleman who presented with ulceration and drainage from the left arm. He was noted to have thrombosis of his brachial access graft as well. He has been dialyzed via a temporary catheter, and is now undergoing excision of his graft.   DESCRIPTION OF PROCEDURE: The patient is taken to the Operating Room and placed in the supine position. After adequate general anesthesia is induced, and appropriate invasive monitors are placed, he is positioned supine with his left arm extended palm upward. The left arm was prepped and draped in sterile fashion. Appropriate timeout is called.   Working over the arterial portion first, the previous scar is incised with a 15 blade and the dissection is carried down to expose the graft itself. The graft is then dissected circumferentially and then dissected down to the suture line. The brachial artery is then exposed so that clamps can be placed proximally and distally. The graft is ligated and divided approximately 2 cm above the anastomosis to make it easier for the dissection. Purulent material is noted around the graft at the time it was exposed. This was then  cultured.   The vascular clamps were then applied upstream and downstream, and the previous suture line is taken down with an 11 blade scalpel. The brachial artery arterial defect is then repaired using a CorMatrix patch with 6-0 Prolene. Flushing maneuvers were performed and flow was re-established to the hand. Distal to repair, excellent pulse was noted in the brachial artery.   Working in a stepwise fashion, the incisions were then created along the path of the graft, and the graft was excised in segments from the bed. Obviously the most difficult and tedious areas were the areas that were routinely being accessed. At the level of the venous anastomosis again the suture line was identified. A pursestring suture of 5-0 Prolene was then placed around the graft. The graft was taken down and the 2 stents were removed with the graft. The hole in the vein was then repaired with a pursestring suture. The venous and arterial incisions were then irrigated with sterile saline, and closed in layers using 3-0 Vicryl. The skin was left open.   A VAC was then placed over the open skin edges of the arterial and venous incisions, as well as into the approximately 4 other incisions, from which the graft was excised. Excellent seal was noted. The VAC will be maintained.   The patient tolerated the procedure well, and there were no immediate complications.    ____________________________ Renford DillsGregory G. Omarian Jaquith, MD ggs:MT D: 09/04/2014 15:43:27 ET T: 09/04/2014 16:23:58 ET JOB#: [id number]  cc: Renford DillsGregory G. Johnnetta Holstine, MD, [facility]<Dictator> [facility] Renford DillsGREGORY G Riyaan Heroux MD ELECTRONICALLY SIGNED 09/24/2014 17:32

## 2015-01-08 NOTE — Op Note (Signed)
PATIENT NAMESOTERO, [name] MR#:  [id number] DATE OF BIRTH:  [birth year]  DATE OF PROCEDURE:  09/09/2014  PREOPERATIVE DIAGNOSES:  1. End-stage renal disease.  2. Recent arteriovenous graft infection with removal of the graft.  3. Bacteremia from pneumonia.  4. Hypertension.   POSTOPERATIVE DIAGNOSES:  1. End-stage renal disease.  2. Recent arteriovenous graft infection with removal of the graft.  3. Bacteremia from pneumonia.  4. Hypertension.  5. Right jugular occlusion.  PROCEDURES: 1. Ultrasound guidance for vascular access to right jugular vein.  2. Right jugular venogram.  3. Ultrasound guidance for vascular access to the left jugular vein.  4. Left jugular venogram.  5. Fluoroscopic guidance for placement of catheter.  6. Placement of a 23 cm tip to cuff tunneled hemodialysis catheter via the left jugular vein.   SURGEON: Annice Needy, M.D.   ANESTHESIA: Local with moderate conscious sedation.   ESTIMATED BLOOD LOSS: 25 mL.   INDICATION FOR PROCEDURE: A 59 year old gentleman with end-stage renal disease for many years. He had to have his left arm AV graft removed. After a recent admission for bacteremia from his pneumonia, his graft became infected. This was removed last week. His cultures have cleared and he now needs permanent dialysis access. The risks and benefits were discussed. Informed consent was obtained.   DESCRIPTION OF PROCEDURE: The patient is brought to the vascular suite. Initially his right neck was sterilely prepped and draped and a sterile surgical field was created. The right jugular vein was visualized with ultrasound and found to be patent in the mid neck. It was unclear if this occluded further down as he has had previous intervention, but I elected to try to gain access through this and use the right jugular for his access. I was able to access the right jugular under direct ultrasound guidance, but a wire would not pass. I placed a micropuncture sheath and  imaging was performed, which showed the jugular vein to be occluded in the mid to proximal cervical portion. I did not feel it was likely to be able to cross this well today, and quickly abandoned this, turning my attention to the left side.   The left jugular vein was visualized, found to be widely patent. It was then accessed under direct ultrasound guidance without difficulty with a micropuncture needle and a permanent image was recorded. A micropuncture wire and sheath were placed. Imaging was performed through the micropuncture sheath to evaluate the jugular flow on the left to see if it was adequate. The jugular vein, innominate vein and superior vena cava all appeared to be widely patent. I then placed a J-wire, after skin nick and dilatation. The peel-away sheath was placed over the wire and the wire and dilator were removed. A counter incision was created under the left clavicle, and I tunneled from the subclavicular incision to the access site. Using fluoroscopic guidance, I selected a 23 cm tip-to-cuff tunneled hemodialysis catheter. This was tunneled from the subclavicular incision to the access site, placed through the peel-away sheath, and the peel-away sheath was removed. The catheter tips were parked in the right atrium distally, in the distal superior vena cava for the proximal tip, and the appropriate distal connectors were placed. It withdrew blood well and flushed easily with heparinized saline, and a concentrated heparin solution was placed. It was secured to the skin with 2 Prolene sutures.   The access incision was closed with a single 4-0 Monocryl, and a 4-0 Monocryl pursestring suture was  placed around the exit site. Sterile dressings were placed.   The patient tolerated the procedure well and was taken to the recovery room in stable condition.    ____________________________ [name], MD jsd:JT D: 09/09/2014 11:09:00 ET T: 09/09/2014 14:24:23 ET JOB#: [id number]  cc: Annice NeedyJason  S.  [name], MD, <Dictator> Annice NeedyJASON S Kadince Boxley MD ELECTRONICALLY SIGNED 09/16/2014 14:13

## 2015-01-08 NOTE — Consult Note (Signed)
PATIENT NAMLuvenia [name]:  [name], [name] MR#:  [id number] DATE OF BIRTH:  08-Dec-1955  DATE OF CONSULTATION:  09/09/2014  REQUESTING PHYSICIAN:  Ena Dawleyatherine P. Clent RidgesWalsh, MD CONSULTING PHYSICIAN:  Stann Mainlandavid P. Sampson GoonFitzgerald, MD  REASON FOR CONSULTATION: Enterococcal bacteremia and infected AV graft.   HISTORY OF PRESENT ILLNESS: This is a very pleasant 59 year old gentleman who is on hemodialysis initially through a left upper extremity graft. The patient was admitted December 15 with shortness of breath, hypoxia, CHF exacerbation and possible pneumonia. He was treated initially with vancomycin and levofloxacin for this. He was found to have bacteremia with enterococcus. He had a temporary catheter placed in his groin and has been on dialysis. On  December 21, he underwent incision and drainage of the left arm wound near the AV graft. Cultures from that ulcer grew enterococcus. The graft was then removed December 23 with a xenograft patch placed. Today, he underwent placement of a left jugular tunneled hemodialysis catheter. The left upper extremity graft has had a wound VAC on it. Clinically, he is improving. He is due for dialysis tomorrow. We are consulted for further antibiotic recommendations.   PAST MEDICAL HISTORY: 1.  Diabetes.  2.  End-stage renal disease.  3.  Diabetic nephropathy.  4.  History of hyperlipidemia.   PAST SURGICAL HISTORY: Placement of left upper extremity AV fistula.   SOCIAL HISTORY: The patient does not smoke or drink.   FAMILY HISTORY: Noncontributory.   ALLERGIES: PENICILLIN.   REVIEW OF SYSTEMS: Eleven systems reviewed and negative except as per HPI.  ANTIBIOTICS SINCE ADMISSION: Include currently vancomycin. He also received levofloxacin December 16.    PHYSICAL EXAMINATION: GENERAL:  He is in no acute distress. VITAL SIGNS:  Temperature 98.3, pulse 89, blood pressure 141/75, respirations 19, saturation 96% on room air.  HEENT: Pupils equal, round, reactive to light and  accommodation. Extraocular movements are intact. Sclerae are anicteric. Oropharynx is clear. NECK: Supple.  HEART: Regular.  LUNGS: Clear.  ABDOMEN: Soft, nontender, nondistended. He is obese.  EXTREMITIES: Trace bilateral lower extremity edema. Vascular access: He has a left upper extremity AV graft site that is covered with a wound VAC. This has minimal induration and tenderness. In his left jugular area he has a tunneled hemodialysis catheter placed today. He also has a right groin catheter. NEUROLOGIC: He is alert and oriented x 3. Grossly nonfocal neurologic exam.   DIAGNOSTIC DATA: Echocardiogram done December 20 showed EF 30%-35% with mild mitral valve regurgitation, mild aortic valve sclerosis, mild to moderate tricuspid regurgitation. Blood cultures done December 15 grew 2 of 2 Enterococcus faecalis sensitive to ampicillin and linezolid. Clostridium difficile, December 16, was negative. Followup blood cultures December 17, no growth to date. Left forearm graft fluid from December 21, light growth of Enterococcus faecalis. Followup blood cultures, December 21, negative. Wound culture from excision of graft December 23 showed rare gram-positive cocci in pairs and rare gram-negative rods. Blood culture was negative. Clostridium difficile repeat December 25 negative. White blood count on admission was 17.8; currently, it is 8.7, hemoglobin 7.1, platelets 275,000. Renal function is consistent with end-stage renal disease. LFTs were normal on admission.   IMPRESSION: A 59 year old gentleman with a history of end-stage renal disease, on hemodialysis through a left upper extremity arteriovenous graft. He now has enterococcal bacteremia from a graft source. The graft had an excision and drainage December 21 and then was completely removed December 23. Graft cultures are also growing enterococcus. He has cleared his cultures and has had placement of a  new left tunneled catheter. Clinically, he is improving.  I presume that all of the graft material has been excised. He has a wound VAC in his left upper extremity at the prior site of the graft. He has had an echocardiogram that does show congestive heart failure and some valvular abnormalities, but no evidence of endocarditis.   RECOMMENDATIONS: 1.  I would recommend vancomycin to cover the enterococcus. He is PENICILLIN ALLERGIC and it will be much easier to dose the vancomycin at dialysis.  2.  For duration an infected AV graft, the usual duration after removal is 2 weeks of IV therapy. However, given concerns about the bacteremia, concern for possible seeding of his valve and with the continued wound in his left upper extremity, I would recommend at least a 4 week course of IV vancomycin dosed at hemodialysis.  3.  I can see him in 2-3 weeks for followup on his wound and adjust his antibiotics as needed.   Thank you for the consult. I will be glad to follow with you.    ____________________________ Stann Mainland. Sampson Goon, MD dpf:LT D: 09/09/2014 15:53:00 ET T: 09/09/2014 16:51:18 ET JOB#: [id number]  cc: Stann Mainland. Sampson Goon, MD, <Dictator> [name] Sampson Goon MD ELECTRONICALLY SIGNED 09/15/2014 21:26

## 2015-01-08 NOTE — Op Note (Signed)
PATIENT NAMLuvenia [name]:  [name], [name] MR#:  [id number] DATE OF BIRTH:  [birth year]  DATE OF PROCEDURE:  08/29/2014  PREOPERATIVE DIAGNOSES:  1.  End-stage renal disease.  2.  Clotted left upper arm arteriovenous graft.  3.  Bacteremia from pneumonia precluding safe intervention on the graft at this time.   POSTOPERATIVE DIAGNOSES:  1.  End-stage renal disease.  2.  Clotted left upper arm arteriovenous graft.  3.  Bacteremia from pneumonia precluding safe intervention on the graft at this time.  SURGEON: Annice NeedyJason S. Dew, MD  PROCEDURES:  1. Ultrasound guidance for vascular access to the right femoral vein.  2. Placement of non-tunneled dialysis catheter into the right femoral vein.  ANESTHESIA: Local.   BLOOD LOSS: Minimal.   INDICATION FOR PROCEDURE: This is a 59 year old gentleman with end-stage renal disease. He was admitted 2 days prior with bacteremia from pneumonia. He has need for a dialysis access, as his graft is clotted and currently with him bacteremic we could not perform a safe procedure on this. He will have a temporary catheter placed today and we will consider salvage of his graft if bacteremia clears in the next 48-72 hours. Risks and benefits were discussed. Informed consent was obtained.   DESCRIPTION OF THE PROCEDURE: The patient's right groin was sterilely prepped and draped and a sterile surgical field was created. The right femoral vein was visualized with ultrasound and found to be patent. It was then accessed under direct ultrasound guidance without difficulty with a Seldinger needle and a permanent image was recorded. A J-wire was placed after skin nick and dilatation. The 30 cm Trialysis dialysis catheter was then placed over the wire and the wire was removed. All lumens withdrew blood well and flushed easily with sterile saline. It was secured to the skin with 3 nylon sutures. A sterile dressing was placed. The patient tolerated the procedure well and remained in his bed in a  stable condition.    ____________________________ Annice NeedyJason S. Dew, MD jsd:bm D: 08/29/2014 16:51:33 ET T: 08/30/2014 02:41:36 ET JOB#: [id number]  cc: Annice NeedyJason S. Dew, MD, <Dictator> Annice NeedyJASON S DEW MD ELECTRONICALLY SIGNED 09/16/2014 14:12

## 2015-01-12 NOTE — Op Note (Signed)
PATIENT NAMLuvenia [name]:  [name], [name] MR#:  [id number] DATE OF BIRTH:  Nov 16, 1955  DATE OF PROCEDURE:  12/26/2014.  PREOPERATIVE DIAGNOSES:  1. End-stage renal disease.  2. Nonfunctional left femoral PermCath.  3. Hypertension.  4. Multiple failed previous dialysis accesses, including infection issues requiring graft excisions.  5. Diabetes.   POSTOPERATIVE DIAGNOSES:  1. End-stage renal disease.  2. Nonfunctional left femoral PermCath.  3. Hypertension.  4. Multiple failed previous dialysis accesses, including infection issues requiring graft excisions.  5. Diabetes.   PROCEDURE:   1. Removal and replacement through same venous access left femoral PermCath, replacing with a 44 cm tip to cuff, tunneled hemodialysis catheter.  2. Fluoroscopic guidance for placement of catheter.   SURGEON: Annice NeedyJason S. Akhilesh Sassone, MD.   ANESTHESIA:  Local with moderate conscious sedation.   ESTIMATED BLOOD LOSS:  25 mL.   INDICATION FOR PROCEDURE:  A 59 year old gentleman with end-stage renal disease.  His PermCath which was recently placed is not working well.  It will not pull correctly for dialysis and his dialysis center has asked that we exchange this.  Risks and benefits were discussed. Informed consent was obtained.   DESCRIPTION OF PROCEDURE:  The patient was brought to the vascular suite.  His existing catheter, left groin, and thigh were sterilely prepped and draped and a sterile surgical field was created.  An Amplatz Super Stiff wire was placed through the previous catheter.  The fibrous cuff surrounding the left femoral PermCath was not particularly well developed and this catheter was easily removed with gentle traction.  The sheath was then placed over the wire.  Using fluoroscopic guidance, I selected a 44 cm tip to cuff, tunneled hemodialysis catheter.  His previous catheter was 55 and was parked in the right atrium, but this was not working well, so we decided to leave this short.  We parked this in the  retrohepatic and slightly suprarenal vena cava under fluoroscopic guidance and removed the wire.  The catheter withdrew blood well and flushed easily with heparinized saline, and a concentrated heparin solution was placed.  I then secured the catheter to the leg with 2 Prolene sutures, and a new 4-0 Monocryl pursestring suture was placed around the exit site.  Sterile dressing was placed.  The patient tolerated the procedure well.    ____________________________ Annice NeedyJason S. Yesli Vanderhoff, MD jsd:kc D: 12/26/2014 17:13:00 ET T: 12/26/2014 17:31:07 ET JOB#: [id number]  cc: Annice NeedyJason S. Adham Johnson, MD, <Dictator> Annice NeedyJASON S Hollister Wessler MD ELECTRONICALLY SIGNED 01/08/2015 13:47

## 2015-01-12 NOTE — Consult Note (Signed)
CHIEF COMPLAINT and HISTORY:  Subjective/Chief Complaint soreness at catheter site   History of Present Illness Patient well known to [name] for his dialysis access needs.  Noted pain and redness at catheter site on dialysis, and we were contacted by his nephrologist for removal.  It has worked fair for dialysis.  We have had multiple previous accesses and infection has been a common issue.   PAST MEDICAL/SURGICAL HISTORY:  Past Medical History:   infected dialysis graft:    Dialysis  (Tues, Thurs, Sat.):    Renal Failure:    htn:    Diabetes:    Dialysis graft left upper arm 12/2010:    AV Fistula removal:    IJ catheter:    right knee cartilage removed:   ALLERGIES:  Allergies:  Penicillin: Hives  HOME MEDICATIONS:  Home Medications: Medication Instructions Status  acetaminophen-HYDROcodone 325 mg-5 mg oral tablet 1 tab(s) orally every 6 hours, As Needed for pain Active  cyclobenzaprine 10 mg oral tablet 1 tab(s) orally every 8 hours, As Needed - for Pain Active  insulin aspart-insulin aspart protamine 30 units-70 units/mL subcutaneous suspension 10 unit(s) subcutaneous 2 times a day (before meals) Active  oxyCODONE 10 mg oral tablet 1 tab(s) orally every 6 hours, As Needed - for Pain Active  oxyCODONE 20 mg oral tablet, extended release 1 tab(s) orally every 12 hours Active  ketorolac 10 mg oral tablet 1 tab(s) orally every 8 hours, As Needed - for Pain Active  Aldactone 25 mg oral tablet 1 tab(s) orally once a day Active  lisinopril 2.5 mg oral tablet 1 tab(s) orally once a day Active  isosorbide mononitrate 30 mg oral tablet, extended release 1 tab(s) orally once a day Active  Aspercreme 10% topical lotion Apply topically to affected area every 8 hours, As Needed - for Pain Active  fluticasone nasal 50 mcg/inh nasal spray 2 spray(s) nasal once a day Active  ketoconazole topical 2% topical cream Apply topically to affected area once a day, As Needed - for Itching Active   lidocaine hcl 2% 5 milliliter(s) orally every 4 hours, As Needed - for Indigestion, Heartburn Active  omeprazole 20 mg oral delayed release tablet 1 tab(s) orally once a day Active  Bayer Aspirin Regimen 325 mg oral enteric coated tablet 1 tab(s) orally once a day (in the morning)  Active  Renal Caps oral capsule 1 cap(s) orally once a day Active  Sensipar 30 mg oral tablet 1 tab(s) orally once a day (in the evening) Active  Mucinex D 600 mg-60 mg oral tablet, extended release 1 tab(s) orally once a day (at bedtime) Active  carvedilol 3.125 mg oral tablet 1 tab(s) orally once a day (at bedtime) Active   Family and Social History:  Family History brother with ESRD, HTN.  Parents with HTN   Social History negative tobacco, negative ETOH, negative Illicit drugs   Review of Systems:  Subjective/Chief Complaint No TIA/stroke/seizure No heat or cold intolerance No dysuria/hematuria.  ESRD No blurry or double vision No tinnitus or ear pain No rashes or ulcer No suicidal ideation or psychosis No signs of bleeding or easy bruising No SOB/DOE, orthopnea, or sputum No palpitations or chest pain No N/V/D or abdominal pain Hip and back pain No fever or chills No unintentional weight loss or gain   Physical Exam:  GEN well developed, well nourished, no acute distress   HEENT pink conjunctivae, hearing intact to voice   NECK No masses  trachea midline   RESP normal  resp effort  no use of accessory muscles   CARD regular rate  no thrills  no JVD   VASCULAR ACCESS Dialysis catheter present  Tenderness around access site  Purulent drainage   ABD denies tenderness  soft  normal BS   GU no superpubic tenderness   LYMPH negative neck, negative axillae   EXTR negative cyanosis/clubbing, positive edema   SKIN normal to palpation, No rashes, No ulcers   NEURO cranial nerves intact, follows commands, motor/sensory function intact   PSYCH A+O to time, place, person   RADIOLOGY:   Radiology Results: XRay:    08-Jun-15 09:30, KUB - Kidney Ureter Bladder  KUB - Kidney Ureter Bladder  REASON FOR EXAM:    Nephrolithasis Renal Colic  COMMENTS:       PROCEDURE: DXR - DXR KIDNEY URETER BLADDER  - Feb 18 2014  9:30AM     CLINICAL DATA:  History of kidney stones.    EXAM:  ABDOMEN - 1 VIEW    COMPARISON:  CT abdomen 10/18/2012    FINDINGS:  The bowel gas pattern is normal. No radio-opaque calculi or other  significant radiographic abnormality are seen. Prominent  calcification in the vas deferens bilaterally.     IMPRESSION:  No renal calculi identified.      Electronically Signed   By: Marlan Palau M.D.    On: 02/18/2014 09:31         Verified By: Camelia Phenes, M.D.,    09-Sep-15 12:07, Chest PA and Lateral  Chest PA and Lateral  REASON FOR EXAM:    Cough, Fever  COMMENTS:       PROCEDURE: DXR - DXR CHEST PA (OR AP) AND LATERAL  - May 22 2014 12:07PM     CLINICAL DATA:  Cough, fever    EXAM:  CHEST  2 VIEW    COMPARISON:  None.    FINDINGS:  Bilateral diffuse interstitial thickening and peribronchial cuffing  most concerning for bronchitis. There is no focal parenchymal  opacity, pleural effusion, or pneumothorax. The heart and  mediastinal contours are unremarkable.    The osseous structures are unremarkable.     IMPRESSION:  Interstitial thickening and peribronchial cuffing most concerning  for bronchitis.      Electronically Signed    By: Elige Ko    On: 05/22/2014 15:21       Verified By: Joellyn Haff, M.D., MD    15-Dec-15 06:59, Chest Portable Single View  Chest Portable Single View  REASON FOR EXAM:    DYSPNEA COUGH, HYPOXIA  COMMENTS:       PROCEDURE: DXR - DXR PORTABLE CHEST SINGLE VIEW  - Aug 27 2014  6:59AM     CLINICAL DATA:  Shortness of breath.    EXAM:  PORTABLE CHEST - 1 VIEW    COMPARISON:  05/22/2014.    FINDINGS:  Mediastinum and hilar structures are unremarkable. Severe  cardiomegaly with  pulmonary vascular prominence bilateral pulmonary  alveolar infiltrates noted consistent congestive heart failure.  Bilateral small pleural effusions arch noted. No pneumothorax.     IMPRESSION:  Congestive heart failure with pulmonary edema and small bilateral  pleural effusions.      Electronically Signed    By: Maisie Fus  Register    On: 08/27/2014 08:34         Verified By: Gwynn Burly, M.D., MD    25-Feb-16 23:16, Hip Right Complete  Hip Right Complete  REASON FOR EXAM:  hip pain  COMMENTS:   LMP: (Male)    PROCEDURE: DXR - DXR HIP RIGHT COMPLETE  - Nov 07 2014 11:16PM     CLINICAL DATA:  Right hip pain. No injury. Longstanding pain  increasing this week.    EXAM:  RIGHT HIP (WITH PELVIS) 2-3 VIEWS    COMPARISON:  None.    FINDINGS:  The cortical margins of the bony pelvis are intact. No fracture.  Pubic symphysis and sacroiliac joints are congruent. Both femoral  heads are well-seated in the respective acetabula. No destructive  bony lesion. Mild enthesopathic change noted about the iliac crests,  hamstrings, and greater trochanters. There are dense vascular  calcifications.     IMPRESSION:  No acute bony abnormality of the pelvis or right hip. Mild diffuse  enthesopathic change about thepelvis.      Electronically Signed    By: Rubye Oaks M.D.    On: 11/08/2014 00:00     Verified By: Maureen Ralphs. Manus Gunning, M.D.,    25-Feb-16 23:16, Lumbar Spine AP and Lateral  Lumbar Spine AP and Lateral  REASON FOR EXAM:    back pain  COMMENTS:   LMP: (Male)    PROCEDURE: DXR - DXR LUMBAR SPINE AP AND LATERAL  - Nov 07 2014 11:16PM     CLINICAL DATA:  Lumbar spine pain. Longstanding pain, increasing  this week.    EXAM:  LUMBAR SPINE - 2-3 VIEW    COMPARISON:  None.    FINDINGS:  Mild loss of height of T4 appears chronic. Remaining vertebral body  heights maintained. There are straightening of normal lordosis. Mild  disc space narrowing at L4-L5.  Multilevel endplate spurs throughout  the entire lumbar spine. There is facet arthropathy throughout, most  significant at L4-L5 and L5-S1. No acute fracture.     IMPRESSION:  1. Lumbar spine spondylosis. Degenerative disc disease at L4-L5.  Multilevel facet arthropathy.  2. Mild loss of height of L4vertebral body appears chronic.      Electronically Signed    By: Rubye Oaks M.D.    On: 11/08/2014 00:03     Verified By: Maureen Ralphs. EHINGER, M.D.,    02-Mar-16 10:00, Chest 1 View AP or PA  Chest 1 View AP or PA  REASON FOR EXAM:    fever, sepsis, cough  COMMENTS:       PROCEDURE: DXR - DXR CHEST 1 VIEWAP OR PA  - Nov 13 2014 10:00AM     CLINICAL DATA:  Shortness of Breath.  Extreme pain.    EXAM:  CHEST  1 VIEW    COMPARISON:  08/27/2014    FINDINGS:  Left dialysis catheter is in place with the tip in the right atrium.  Cardiomegaly with vascular congestion. Increasing opacity in the  right upper lobe. Cannot exclude pneumonia. No confluent opacity on  the left. No acute bony abnormality.     IMPRESSION:  Focal right upper lobe airspace opacity increasing since prior  study. Findings concerning for pneumonia.    Cardiomegaly with vascular congestion      Electronically Signed    By: Charlett Nose M.D.    On: 11/13/2014 10:15       Verified By: Cyndie Chime, M.D.,    25-Mar-16 13:39, Hip Left Complete  Hip Left Complete  REASON FOR EXAM:    pain with movement  COMMENTS:       PROCEDURE: DXR - DXR HIP LEFT COMPLETE  - Dec 06 2014  1:39PM  CLINICAL DATA:  LEFT hip pain off and on for while, history dialysis    EXAM:  LEFT HIP (WITH PELVIS) 2-3 VIEWS    COMPARISON:  None    FINDINGS:  Osseous demineralization.  Symmetric hip and SI joints.    No acute fracture, dislocation or bone destruction.    Extensive atherosclerotic calcifications.    Prominent stool in colon.    Scattered bedding artifacts.     IMPRESSION:  Osseous demineralization  without acute osseous abnormalities.      Electronically Signed    By: Ulyses Southward M.D.    On: 12/06/2014 13:55         Verified By: Lollie Marrow, M.D.,  LabUnknown:    08-Jun-15 09:30, KUB - Kidney Ureter Bladder  PACS Image    09-Sep-15 12:07, Chest PA and Lateral  PACS Image    15-Dec-15 06:59, Chest Portable Single View  PACS Image    25-Feb-16 23:16, Hip Right Complete  PACS Image    25-Feb-16 23:16, Lumbar Spine AP and Lateral  PACS Image    01-Mar-16 12:06, CT Pelvis With Contrast  PACS Image    25-Mar-16 13:39, Hip Left Complete  PACS Image    26-Mar-16 09:33, CT Hip Left Without Contrast  PACS Image  CT:    01-Mar-16 12:06, CT Pelvis With Contrast  CT Pelvis With Contrast  REASON FOR EXAM:    bilateral hip pain, fever;    NOTE: Nursing to Give   Oral CT Contrast  COMMENTS:       PROCEDURE: CT  - CT PELVIS STANDARD W  - Nov 12 2014 12:06PM     CLINICAL DATA:  Bilateral hip pain for 3 weeks    EXAM:  CT PELVIS WITH CONTRAST    TECHNIQUE:  Multidetector CT imaging of the pelvis was performed using the  standard protocol following the bolus administration of intravenous  contrast.  CONTRAST:  100 mL Omnipaque 350.    COMPARISON:  11/07/2014    FINDINGS:  At the upper end of the images, there is fragmentation of the  inferior aspect of the L4 vertebral body most consistent with a  compression deformity. This is similar to that seen on the prior  plain film examination. This is of uncertain chronicity. Significant  disc bulging is identified causing some thecal sac impingement  particularly on the right. Mild disc bulging is noted L5-S1 without  significant nerve root impingement.    No pelvic fracture is noted. Mild degenerative changes about the hip  joints andpubic symphysis are seen.  The soft tissue structures show some mild atherosclerotic changes of  the iliac vessels. The bladder is partially distended. No pelvic  mass lesion is seen. No joint  effusion is identified within the hip  joints. No other soft tissue abnormality is noted.     IMPRESSION:  Changes consistent with compression deformity at L4 with endplate  irregularity and fragmentation. This may be chronic in nature  although was not present on prior exams from 2014. Clinical  correlation is recommended.    No evidence of hip fracture or dislocation.    Degenerative changes of the hip joints and pubic symphysis.  Electronically Signed    By: Alcide Clever M.D.    On: 11/12/2014 12:46         Verified By: Phillips Odor, M.D.,    26-Mar-16 09:33, CT Hip Left Without Contrast  CT Hip Left Without Contrast  REASON FOR EXAM:  pain l hip  COMMENTS:       PROCEDURE: CT  - CT HIP LEFT WITHOUT CONTRAST  - Dec 07 2014  9:33AM     CLINICAL DATA:  Chronic left hip pain for 1 month. Patient on  dialysis.    EXAM:  CT OF THE LEFT HIP WITHOUT CONTRAST    TECHNIQUE:  Multidetector CT imaging of the left hip was performed according to  the standard protocol. Multiplanar CT image reconstructions were  also generated.  COMPARISON:  Radiographs 12/06/2014    FINDINGS:  Radiodense material within nondilated bowel is compatible with  ingested content. There is no record of previous administration of  oral contrast. Extensive vascular calcifications are identified.  Vascular collaterals and probable varicoceles noted within the  scrotum. On the first axial image, left pelvic sidewall ovoid soft  tissue nodule likely represents a lymph node measuring 0.7 cm in  maximal short axis diameter and is nonspecific and incompletely  characterized. No other evidence for pelvic lymphadenopathy. Mild  stool impaction within the rectum.    Minimal soft tissue edema. No gross evidence for left hip joint  effusion at CT technique. Atrophy of the left proximal fine  musculature is identified. No fracture or dislocation is seen.  Minimal left hip degenerative change noted with  subchondral cyst  formation and minimal subarticular sclerosis.     IMPRESSION:  No CT evidence for left hip fracture or dislocation. Subtle marrow  edema indicating nondisplaced fracture may be occult at CT and would  be best identified at [facility] clinically indicated. No gross evidence  for joint effusion.    Mild stool impaction.      Electronically Signed    By: Christiana Pellant M.D.    On: 12/07/2014 09:51         Verified By: Harrel Lemon, M.D.,   ASSESSMENT AND PLAN:  Assessment/Admission Diagnosis ESRD Likely permcath infection HTN Other medical issues as above   Plan Will remove Permcath today Will replace after several days if cultures negative   level 3 consult   Electronic Signatures: Annice Needy (MD)  (Signed 07-Apr-16 13:09)  Authored: Chief Complaint and History, PAST MEDICAL/SURGICAL HISTORY, ALLERGIES, HOME MEDICATIONS, Family and Social History, Review of Systems, Physical Exam, RADIOLOGY, Assessment and Plan   Last Updated: 07-Apr-16 13:09 by Annice Needy (MD)

## 2015-01-12 NOTE — Consult Note (Signed)
CHIEF COMPLAINT and HISTORY:  Subjective/Chief Complaint fever   History of Present Illness 59 yo AAM with ESRD who has been previous treated by our practice for his dialysis access.  Here with fever, pneumonia, and questionable infection of his permcath.  It is mildly tender.  He has been using the catheter.  Reported that some possible pus was expressed from around catheter.  He had his left arm graft removed several weeks ago for infection and those wounds seem to be healing well.   PAST MEDICAL/SURGICAL HISTORY:  Past Medical History:   infected dialysis graft:    Dialysis  (Tues, Thurs, Sat.):    Renal Failure:    htn:    Diabetes:    Dialysis graft left upper arm 12/2010:    AV Fistula removal:    IJ catheter:    right knee cartilage removed:   ALLERGIES:  Allergies:  Penicillin: Hives  HOME MEDICATIONS:  Home Medications: Medication Instructions Status  ketorolac 10 mg oral tablet 1 tab(s) orally every 8 hours, As Needed - for Pain Active  cyclobenzaprine 10 mg oral tablet 1 tab(s) orally every 8 hours, As Needed - for Pain Active  oxyCODONE 10 mg oral tablet 1 tab(s) orally every 6 hours, As Needed - for Pain Active  Aldactone 25 mg oral tablet 1 tab(s) orally once a day Active  furosemide 40 mg oral tablet 1 tab(s) orally once a day Active  lisinopril 2.5 mg oral tablet 1 tab(s) orally once a day Active  isosorbide mononitrate 30 mg oral tablet, extended release 1 tab(s) orally once a day Active  vancomycin 1000 milligram(s) iv 3 times a week with each Hemodialysis  Active  Bayer Aspirin Regimen 325 mg oral enteric coated tablet 1 tab(s) orally once a day (in the morning)  Active  Renal Caps oral capsule 1 cap(s) orally once a day Active  Sensipar 30 mg oral tablet 1 tab(s) orally once a day (in the evening) Active  Nexium 40 mg oral delayed release capsule 1 cap(s) orally once a day Active  Mucinex D 600 mg-60 mg oral tablet, extended release 1 tab(s) orally  once a day (at bedtime) Active  Tums 1000 mg oral tablet, chewable 3 tab(s) orally 2 times a day Active  carvedilol 3.125 mg oral tablet 1 tab(s) orally once a day (at bedtime) Active  NovoLOG Mix 70/30 FlexPen 30 units-70 units/mL subcutaneous suspension 25 unit(s) subcutaneous once a day (in the morning) Active  NovoLOG Mix 70/30 FlexPen 30 units-70 units/mL subcutaneous suspension 15 unit(s) subcutaneous once a day (in the evening) Active   Family and Social History:  Family History Hypertension  Diabetes Mellitus  brother on dialysis as well   Social History negative tobacco, negative ETOH, negative Illicit drugs   Place of Living Home   Review of Systems:  Subjective/Chief Complaint No TIA/stroke/seizure No heat or cold intolerance No dysuria/hematuria.  Longstanding ESRD No blurry or double vision No tinnitus or ear pain No rashes or ulcer No suicidal ideation or psychosis No signs of bleeding or easy bruising No SOB/DOE, orthopnea, or sputum No palpitations or chest pain No N/V/D or abdominal pain No joint pain or joint swelling Positive for fever and chills No unintentional weight loss or gain   Physical Exam:  GEN well developed, no acute distress   HEENT pink conjunctivae, hearing intact to voice, Oropharynx clear   NECK No masses  trachea midline   RESP normal resp effort  no use of accessory muscles  CARD regular rate  no thrills  no JVD   VASCULAR ACCESS Dialysis catheter present  left jugular location.  There is currently no erythema or drainage present.  It is minimally tender as is typical and I can not express any fluid around it.  Not clearly infected   ABD denies tenderness  soft  normal BS   GU no superpubic tenderness   LYMPH negative neck, negative axillae   EXTR negative cyanosis/clubbing, positive edema   SKIN normal to palpation, No ulcers, left arm incisions healing well   NEURO cranial nerves intact, follows commands, motor/sensory  function intact   PSYCH alert, A+O to time, place, person   LABS:  Laboratory Results: LabObservation:    02-Mar-16 10:00, Chest 1 View AP or PA  OBSERVATION   PACS Image [id number]  Routine Micro:    01-Mar-16 10:50, Blood Culture  Micro Text Report   BLOOD CULTURE    COMMENT                   NO GROWTH IN 65 HOURS     ANTIBIOTIC  Culture Comment   NO GROWTH IN 32 HOURS   Result(s) reported on 14 Nov 2014 at 11:00AM.    01-Mar-16 11:01, Blood Culture  Micro Text Report   BLOOD CULTURE    COMMENT                   NO GROWTH IN 48 HOURS     ANTIBIOTIC  Culture Comment   NO GROWTH IN 17 HOURS   Result(s) reported on 14 Nov 2014 at 11:00AM.  Routine Chem:    01-Mar-16 61:44, Basic Metabolic Panel (w/Total Calcium)  Glucose, Serum 142  BUN 23  Creatinine (comp) 6.99  Sodium, Serum 137  Potassium, Serum 3.1  Chloride, Serum 100  CO2, Serum 26  Calcium (Total), Serum 9.3  Anion Gap 11  Osmolality (calc) 280  eGFR (African American) 11  eGFR (Non-African American) 9  eGFR values <43m/min/1.73 m2 may be an indication of chronic  kidney disease (CKD).  Calculated eGFR, using the MRDR Study equation, is useful in   patients with stable renal function.  The eGFR calculation will not be reliable in acutely ill patients  when serum creatinine is changing rapidly. It is not useful in  patients on dialysis. The eGFR calculation may not be applicable  to patients at the low and high extremes of body sizes, pregnant  women, and vegetarians.    02-Mar-16 031:54 Basic Metabolic Panel (w/Total Calcium)  Glucose, Serum 121  BUN 32  Creatinine (comp) 8.89  Sodium, Serum 137  Potassium, Serum 3.5  Chloride, Serum 99  CO2, Serum 24  Calcium (Total), Serum 9.2  Anion Gap 14  Osmolality (calc) 282  eGFR (African American) 8  eGFR (Non-African American) 7  eGFR values <635mmin/1.73 m2 may be an indication of chronic  kidney disease (CKD).  Calculated eGFR,  using the MRDR Study equation, is useful in   patients with stable renal function.  The eGFR calculation will not be reliable in acutely ill patients  when serum creatinine is changing rapidly. It is not useful in  patients on dialysis. The eGFR calculation may not be applicable  to patients at the low and high extremes of body sizes, pregnant  women, and vegetarians.    03-Mar-16 08:40, Phosphorus, Serum  Phosphorus, Serum 6.6  Result(s) reported on 14 Nov 2014 at 11:19AM.  Routine Hem:    01-Mar-16 09:11, CBC Profile  WBC (CBC) 12.2  RBC (CBC) 3.27  Hemoglobin (CBC) 8.1  Hematocrit (CBC) 26.6  Platelet Count (CBC) 423  MCV 82  MCH 24.8  MCHC 30.4  RDW 20.0  Neutrophil % 77.9  Lymphocyte % 7.9  Monocyte % 12.7  Eosinophil % 0.5  Basophil % 1.0  Neutrophil # 9.5  Lymphocyte # 1.0  Monocyte # 1.6  Eosinophil # 0.1  Basophil # 0.1  Result(s) reported on 12 Nov 2014 at 09:27AM.    02-Mar-16 06:31, CBC Profile  WBC (CBC) 12.6  RBC (CBC) 3.28  Hemoglobin (CBC) 8.3  Hematocrit (CBC) 27.2  Platelet Count (CBC) 377  MCV 83  MCH 25.2  MCHC 30.4  RDW 19.9  Neutrophil % 69.3  Lymphocyte % 13.4  Monocyte % 14.9  Eosinophil % 1.1  Basophil % 1.3  Neutrophil # 8.7  Lymphocyte # 1.7  Monocyte # 1.9  Eosinophil # 0.1  Basophil # 0.2  Result(s) reported on 13 Nov 2014 at 07:32AM.   RADIOLOGY:  Radiology Results: XRay:    08-Jun-15 09:30, KUB - Kidney Ureter Bladder  KUB - Kidney Ureter Bladder  REASON FOR EXAM:    Nephrolithasis Renal Colic  COMMENTS:       PROCEDURE: DXR - DXR KIDNEY URETER BLADDER  - Feb 18 2014  9:30AM     CLINICAL DATA:  History of kidney stones.    EXAM:  ABDOMEN - 1 VIEW    COMPARISON:  CT abdomen 10/18/2012    FINDINGS:  The bowel gas pattern is normal. No radio-opaque calculi or other  significant radiographic abnormality are seen. Prominent  calcification in the vas deferens bilaterally.     IMPRESSION:  No renal calculi  identified.      Electronically Signed   By: Franchot Gallo M.D.    On: 02/18/2014 09:31         Verified By: Truett Perna, M.D.,    09-Sep-15 12:07, Chest PA and Lateral  Chest PA and Lateral  REASON FOR EXAM:    Cough, Fever  COMMENTS:       PROCEDURE: DXR - DXR CHEST PA (OR AP) AND LATERAL  - May 22 2014 12:07PM     CLINICAL DATA:  Cough, fever    EXAM:  CHEST  2 VIEW    COMPARISON:  None.    FINDINGS:  Bilateral diffuse interstitial thickening and peribronchial cuffing  most concerning for bronchitis. There is no focal parenchymal  opacity, pleural effusion, or pneumothorax. The heart and  mediastinal contours are unremarkable.    The osseous structures are unremarkable.     IMPRESSION:  Interstitial thickening and peribronchial cuffing most concerning  for bronchitis.      Electronically Signed    By: Kathreen Devoid    On: 05/22/2014 15:21       Verified By: Jennette Banker, M.D., MD    15-Dec-15 06:59, Chest Portable Single View  Chest Portable Single View  REASON FOR EXAM:    DYSPNEA COUGH, HYPOXIA  COMMENTS:       PROCEDURE: DXR - DXR PORTABLE CHEST SINGLE VIEW  - Aug 27 2014  6:59AM     CLINICAL DATA:  Shortness of breath.    EXAM:  PORTABLE CHEST - 1 VIEW    COMPARISON:  05/22/2014.    FINDINGS:  Mediastinum and hilar structures are unremarkable. Severe  cardiomegaly with pulmonary vascular prominence bilateral pulmonary  alveolar infiltrates noted consistent congestive heart failure.  Bilateral small pleural effusions  arch noted. No pneumothorax.     IMPRESSION:  Congestive heart failure with pulmonary edema and small bilateral  pleural effusions.      Electronically Signed    By: Marcello Moores  Register    On: 08/27/2014 08:34         Verified By: Osa Craver, M.D., MD    25-Feb-16 23:16, Hip Right Complete  Hip Right Complete  REASON FOR EXAM:    hip pain  COMMENTS:   LMP: (Male)    PROCEDURE: DXR - DXR HIP RIGHT COMPLETE  -  Nov 07 2014 11:16PM     CLINICAL DATA:  Right hip pain. No injury. Longstanding pain  increasing this week.    EXAM:  RIGHT HIP (WITH PELVIS) 2-3 VIEWS    COMPARISON:  None.    FINDINGS:  The cortical margins of the bony pelvis are intact. No fracture.  Pubic symphysis and sacroiliac joints are congruent. Both femoral  heads are well-seated in the respective acetabula. No destructive  bony lesion. Mild enthesopathic change noted about the iliac crests,  hamstrings, and greater trochanters. There are dense vascular  calcifications.     IMPRESSION:  No acute bony abnormality of the pelvis or right hip. Mild diffuse  enthesopathic change about thepelvis.      Electronically Signed    By: Jeb Levering M.D.    On: 11/08/2014 00:00     Verified By: Rollene Fare. Marisue Humble, M.D.,    25-Feb-16 23:16, Lumbar Spine AP and Lateral  Lumbar Spine AP and Lateral  REASON FOR EXAM:    back pain  COMMENTS:   LMP: (Male)    PROCEDURE: DXR - DXR LUMBAR SPINE AP AND LATERAL  - Nov 07 2014 11:16PM     CLINICAL DATA:  Lumbar spine pain. Longstanding pain, increasing  this week.    EXAM:  LUMBAR SPINE - 2-3 VIEW    COMPARISON:  None.    FINDINGS:  Mild loss of height of T4 appears chronic. Remaining vertebral body  heights maintained. There are straightening of normal lordosis. Mild  disc space narrowing at L4-L5. Multilevel endplate spurs throughout  the entire lumbar spine. There is facet arthropathy throughout, most  significant at L4-L5 and L5-S1. No acute fracture.     IMPRESSION:  1. Lumbar spine spondylosis. Degenerative disc disease at L4-L5.  Multilevel facet arthropathy.  2. Mild loss of height of L4vertebral body appears chronic.      Electronically Signed    By: Jeb Levering M.D.    On: 11/08/2014 00:03     Verified By: Rollene Fare. EHINGER, M.D.,    02-Mar-16 10:00, Chest 1 View AP or PA  Chest 1 View AP or PA  REASON FOR EXAM:    fever, sepsis, cough  COMMENTS:        PROCEDURE: DXR - DXR CHEST 1 VIEWAP OR PA  - Nov 13 2014 10:00AM     CLINICAL DATA:  Shortness of Breath.  Extreme pain.    EXAM:  CHEST  1 VIEW    COMPARISON:  08/27/2014    FINDINGS:  Left dialysis catheter is in place with the tip in the right atrium.  Cardiomegaly with vascular congestion. Increasing opacity in the  right upper lobe. Cannot exclude pneumonia. No confluent opacity on  the left. No acute bony abnormality.     IMPRESSION:  Focal right upper lobe airspace opacity increasing since prior  study. Findings concerning for pneumonia.    Cardiomegaly with vascular congestion  Electronically Signed    By: Rolm Baptise M.D.    On: 11/13/2014 10:15       Verified By: Raelyn Number, M.D.,  [place]:    08-Jun-15 09:30, KUB - Kidney Ureter Bladder  PACS Image    09-Sep-15 12:07, Chest PA and Lateral  PACS Image    15-Dec-15 06:59, Chest Portable Single View  PACS Image    25-Feb-16 23:16, Hip Right Complete  PACS Image    25-Feb-16 23:16, Lumbar Spine AP and Lateral  PACS Image    01-Mar-16 12:06, CT Pelvis With Contrast  PACS Image  CT:  CT Pelvis With Contrast  REASON FOR EXAM:    bilateral hip pain, fever;    NOTE: Nursing to Give   Oral CT Contrast  COMMENTS:       PROCEDURE: CT  - CT PELVIS STANDARD W  - Nov 12 2014 12:06PM     CLINICAL DATA:  Bilateral hip pain for 3 weeks    EXAM:  CT PELVIS WITH CONTRAST    TECHNIQUE:  Multidetector CT imaging of the pelvis was performed using the  standard protocol following the bolus administration of intravenous  contrast.  CONTRAST:  100 mL Omnipaque 350.    COMPARISON:  11/07/2014    FINDINGS:  At the upper end of the images, there is fragmentation of the  inferior aspect of the L4 vertebral body most consistent with a  compression deformity. This is similar to that seen on the prior  plain film examination. This is of uncertain chronicity. Significant  disc bulging is identified  causing some thecal sac impingement  particularly on the right. Mild disc bulging is noted L5-S1 without  significant nerve root impingement.    No pelvic fracture is noted. Mild degenerative changes about the hip  joints andpubic symphysis are seen.  The soft tissue structures show some mild atherosclerotic changes of  the iliac vessels. The bladder is partially distended. No pelvic  mass lesion is seen. No joint effusion is identified within the hip  joints. No other soft tissue abnormality is noted.     IMPRESSION:  Changes consistent with compression deformity at L4 with endplate  irregularity and fragmentation. This may be chronic in nature  although was not present on prior exams from 2014. Clinical  correlation is recommended.    No evidence of hip fracture or dislocation.    Degenerative changes of the hip joints and pubic symphysis.  Electronically Signed    By: Inez Catalina M.D.    On: 11/12/2014 12:46         Verified By: Everlene Farrier, M.D.,   ASSESSMENT AND PLAN:  Assessment/Admission Diagnosis fever being treated for pneumonia questionable permcath infection ESRD, on dialysis through catheter HTN previous left arm AVG infection, has been removed   Plan The permcath does not clinically look infected, but is certainly possible.  Has cultures from outpatient through catheter that were reported as no growth.  Blood cultures here negative so far.  has had many previous catheters and accesses, and would not remove catheter unless we are sure that infection was present, as he may have limited access options in the future.  Will watch cultures.  If cultures positive, will have to remove catheter and place temp cath.  Still needs outpatient work up for new AVF/AVG.     level 4 consult   Electronic Signatures: Algernon Huxley (MD)  (Signed 03-Mar-16 15:02)  Authored: Chief Complaint and History, PAST  MEDICAL/SURGICAL HISTORY, ALLERGIES, HOME MEDICATIONS, Family and  Social History, Review of Systems, Physical Exam, LABS, RADIOLOGY, Assessment and Plan   Last Updated: 03-Mar-16 15:02 by Algernon Huxley (MD)

## 2015-01-12 NOTE — Consult Note (Addendum)
PATIENT NAMLuvenia [name]:  [name], [name] MR#:  [id number] DATE OF BIRTH:  Dec 23, 1955  DATE OF CONSULTATION:  12/20/2014  REFERRING PHYSICIAN:  Dr. Allena KatzPatel CONSULTING PHYSICIAN:  Stann Mainlandavid P. Sampson GoonFitzgerald, MD  REASON FOR CONSULTATION: Infected hemodialysis catheter.   HISTORY OF PRESENT ILLNESS: This is a very pleasant 59 year old gentleman who I have seen before for prior dialysis graft site infections with enterococcus. He was admitted April 7th when at dialysis he was found to have purulence around his right groin PermCath. He had initially had a PermCath in his IJ, but this was no longer working and had to be replaced with a groin line on March 25th. The patient states he was doing relatively well, but he had noticed the purulence from the site. He had not had any fevers, chills. He was tolerating dialysis otherwise. He has not been on any recent antibiotics.   PAST MEDICAL HISTORY: Hypertension, end-stage renal disease, diabetes, diabetic neuropathy, hyperlipidemia. 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  PAST SURGICAL HISTORY: AV fistula in the left arm complicated by infection.   SOCIAL HISTORY: Does not smoke, does not drink. No drugs. He is independent.   FAMILY HISTORY: Noncontributory.   ANTIBIOTICS SINCE ADMISSION: Include vancomycin. Other medications are reviewed.   REVIEW OF SYSTEMS: Eleven systems reviewed and negative except as per HPI.  PHYSICAL EXAMINATION: VITAL SIGNS: Temperature 98.2, pulse 91, blood pressure 99/62, respirations 17, sat 90% on room air.  GENERAL: He is pleasant, interactive, somewhat disheveled.  HEENT: Pupils reactive. Sclerae anicteric. Oropharynx clear.  NECK: Supple.  HEART: Regular.  LUNGS: Clear to auscultation bilaterally.  ABDOMEN: Soft, nontender, and nondistended. VASCULAR ACCESS: In his right groin, he has an area where there was a catheter that since has been removed. It is mildly tender to palpation. He also has in  his left chest wall prior scars from other catheters.  NEUROLOGIC: He is alert and oriented x3. Grossly nonfocal neuro exam.   DIAGNOSTIC DATA: Blood cultures were done in dialysis at an outside facility and are pending. I did discuss with Dr. Wynelle LinkKolluru but they are not back yet. White count on admission 11; followup April 8th 15.7, hemoglobin 11.1, platelets 243,000. Culture from his dialysis catheter tip is no growth. There are a few white blood cells seen. Renal function is consistent with end-stage renal disease.  Left hip x-ray showed no acute fracture or dislocation.   Right hip shows mild decreased right hip joint space, which is seen in osteoarthritis.   IMPRESSION: A 59 year old gentleman admitted with right groin hemodialysis catheter infection. This was placed approximately 3 weeks ago. It has since been removed, on April 7th. Cultures from the blood are pending from his outpatient dialysis center and cultures of the catheter tip are pending here. He has a history of enterococcal infection in the past. This is more likely to be Staphylococcus aureus, however,   RECOMMENDATIONS: Continue vancomycin dosing at hemodialysis. Now that the catheter is removed he would likely benefit from a 2 week course following removal. I would recommend follow-up blood cultures and replacing the catheter once the cultures are negative for 48 hours.   Thank you for the consult. I will be glad to follow with you.   ____________________________ Stann Mainlandavid P. Sampson GoonFitzgerald, MD dpf:sb D: 12/20/2014 15:37:02 ET T: 12/20/2014 15:57:23 ET JOB#: [id number]  cc: Stann Mainlandavid P. Sampson GoonFitzgerald, MD, <Dictator> Arleta Ostrum Sampson GoonFITZGERALD MD ELECTRONICALLY SIGNED 01/22/2015 10:17

## 2015-01-12 NOTE — Op Note (Signed)
PATIENT NAMLuvenia Redden:  Holdman, Gibril MR#:  [id number] DATE OF BIRTH:  03-23-1956  DATE OF PROCEDURE:  12/04/2014  PREOPERATIVE DIAGNOSES:  1.  Complication of dialysis catheter.  2.  End-stage renal disease.   POSTOPERATIVE DIAGNOSES:  1.  Complication of dialysis catheter.  2.  End-stage renal disease.  PROCEDURE PERFORMED: Exchange of cuffed tunneled dialysis catheter, same venous access.   SURGEON: Renford DillsGregory G. Schnier, MD    SEDATION: Versed plus fentanyl.   CONTRAST USED: None.   FLUOROSCOPY TIME: 4.6 minutes.   ACCESS: Left jugular vein.   INDICATION: [name] is sent by his dialysis center secondary to poor function of his dialysis catheter. Risks and benefits are reviewed. All questions answered. The patient has agreed for us to proceed with exchange.   DESCRIPTION OF PROCEDURE: The patient is taken to special procedures and placed in the supine position. After adequate sedation is achieved, his left neck, chest wall and catheter are prepped and draped in sterile fashion. Appropriate timeout is called.   Lidocaine 1% is infiltrated into the soft tissue surrounding the cuff and through the exit site of the existing catheter. The cuff is exposed and then freed circumferentially. The catheter is then transected just in front of the cuff and an Amplatz Super Stiff wire is advanced under fluoroscopic guidance. The catheter subsequently removed in its entirety and a KMP catheter is advanced over the Amplatz. Initial maneuvers guide the Amplatz into the inferior vena cava were unsuccessful and the Amplatz is removed and a floppy Glidewire is advanced through the Kumpe and then negotiated down into the IVC. Kumpe catheter is advanced into the IVC and subsequently the wire is exchanged for the Amplatz Super Stiff wire.   The catheter is then measured using the 5 cm increments on the scalpel blade and it appears to be a 23 cm tip to cuff catheter. Therefore, a DuraMax 23 cm tip to cuff catheter is  advanced over the wire; however, upon removal of the wire, attempts aspirating were poor. Repositioning the catheter so that it extended 4-5 cm out did improve the flow but this created a very extended long segment outside, which is inappropriate and, therefore, the wire is reintroduced and negotiated into the IVC. A 19 cm tip to cuff DuraMax catheter was advanced, but this provided very poor flows, again, just poor aspiration. I believe the DuraMax catheter itself is a problem here and, therefore, a 19 cm tip to cuff Palindrome was advanced over the wire and positioned with its tips at the atriocaval junction and this provided excellent flow from both lumens. Therefore, the catheter was secured to the chest wall with 0 silk, packed with 5000 units of heparin per lumen after it had been flushed, and a sterile dressing was applied. The patient tolerated the procedure well and there were no immediate complications.    ____________________________ Renford DillsGregory G. Schnier, MD ggs:bm D: 12/04/2014 17:41:19 ET T: 12/05/2014 06:12:41 ET JOB#: [id number]  cc: Renford DillsGregory G. Schnier, MD, <Dictator> Renford DillsGREGORY G SCHNIER MD ELECTRONICALLY SIGNED 12/24/2014 15:09

## 2015-01-12 NOTE — H&P (Signed)
PATIENT NAMEAISEA, [name] MR#:  [id number] DATE OF BIRTH:  Feb 01, 1956  DATE OF ADMISSION:  11/12/2014  PRIMARY CARE PHYSICIAN: Dr. Thedore Mins, nephrologist and he is also his nephrologist.   PRIMARY CARDIOLOGIST: Dr. Darrold Junker.   REFERRING EMERGENCY ROOM PHYSICIAN: Dr. Toney Rakes.   CHIEF COMPLAINT: Fever.   HISTORY OF PRESENT ILLNESS:  A 60 year old male who has history of hypertension, end-stage renal disease on hemodialysis Tuesday, Thursday, and Saturday, diabetic neuropathy, and hyperlipidemia. He did not have any complaint until yesterday, he said that yesterday he had some chills and feeling a little warm, but did not pay much attention, has little bit of cough with slight sputum production and today when he went for his dialysis he was noted to have fever 101, so he was sent to Emergency Room for further evaluation from dialysis after doing 2 hours of hemodialysis. He was noted to have slight tachycardia and elevated white cell count, so given to hospitalist team for further management of this issue. On questioning he denies any shortness of breath. He does not make any urine. He does not have any open wounds on the skin.  His fistula on left arm stopped working and so he was given PermCath 3 weeks ago and the plan was for him to go to a vascular doctor to have his mapping on the right arm done for future dialysis access.   REVIEW OF SYSTEMS:    CONSTITUTIONAL: Positive for fever. No fatigue, weakness, pain, or weight loss.  EYES: No blurring or double vision, discharge, or redness.  EARS, NOSE, THROAT: No tinnitus, ear pain, or hearing loss.  RESPIRATORY: The patient has some cough, mild sputum production. No shortness of breath.  CARDIOVASCULAR: No chest pain, orthopnea, edema, arrhythmia, palpitation.  GASTROINTESTINAL: No nausea, vomiting, diarrhea, abdominal pain.  GENITOURINARY: No dysuria, hematuria, increased frequency.  ENDOCRINE: No heat or cold intolerance. No excessive sweating.   SKIN: No acne, rashes, or lesions.  MUSCULOSKELETAL: No pain or swelling in the joints.  NEUROLOGICAL: No numbness, weakness, tremor, or vertigo.  PSYCHIATRIC: No anxiety, insomnia, bipolar disorder.   PAST MEDICAL HISTORY:  1. Hypertension.  2. End-stage renal disease on hemodialysis.  3. Diabetic neuropathy.  4. History of hyperlipidemia.   PAST SURGICAL HISTORY: AV fistula access in left arm.   FAMILY HISTORY: Negative for hypertension or diabetes.   SOCIAL HISTORY: He does not smoke. He quit drinking in 1979. Does not do drugs and he is on hemodialysis. Independent in day-to-day activities.   HOME MEDICATIONS:  1. Tums 1000 mg oral 3 tablets 2 times a day.   2. Sensipar 30 mg oral tablet once a day.  3. Renal vitamin capsule once a day.  4. Oxycodone 10 mg oral every 6 hours as needed for pain.  5. NovoLog 70/30 mix take 25 units subcutaneous in the morning and 15 units subcutaneous in the evening.  6. Nexium 40 mg oral once a day.  7. Mucinex 600 + 60 mg tablet once a day.  8. Lisinopril 2.5 mg oral tablet once a day.  9. Ketorolac 10 mg oral tablet every 8 hours for pain.  10. Isosorbide mononitrate 30 mg extended release once a day.  11. Furosemide 40 mg oral once a day.  12. Cyclobenzaprine 10 mg oral every 8 hours as needed for pain.  13. Carvedilol 3.125 mg oral once a day.  14. Aspirin 325 mg enteric coated once a day.    15. Aldactone 25 mg oral once a day.  PHYSICAL EXAMINATION:  VITAL SIGNS: In ER temperature 98.6, pulse is 102, respirations 18, blood pressure 120/67, and pulse oximetry is on room air.  GENERAL: The patient is fully alert and oriented to time, place, and person. Does not appear in any acute distress.  HEENT: Head and neck atraumatic. Pupils bilaterally reactive. Conjunctivae pink. Oral mucosa moist.  NECK: Supple. Thyroid nontender. No JVD.  RESPIRATORY: Bilateral equal and clear air entry. No crepitation or wheezing.  CARDIOVASCULAR: S1, S2  present, regular. No murmur.  ABDOMEN: Soft, nontender. Bowel sounds present. No organomegaly.  SKIN: Warm to touch. No acne, rashes, or lesions.  MUSCULOSKELETAL: No tenderness or swelling in the joints.  NEUROLOGICAL: Power 5 out of 5 and able to move all 4 limbs. Sensation is intact. No tremor or rigidity.  PSYCHIATRIC: Does not appear in acute psychiatric illness.   IMPORTANT LABORATORY RESULTS:  1.  Glucose 142, BUN 23, creatinine 6.99, sodium 137, potassium 3.1, chloride 100, CO2 of 26, calcium is 9.3.  2.  WBC 12.2, hemoglobin 8.1, platelet count 423,000, MCV is 82.  3.  Lactic acid is 1.4.  4.  CT scan of the pelvis with contrast is done today because of his bilateral hip pain, which shows changes consistent with compression deformity at L4, may be chronic, no evidence of a fracture or dislocation, degenerative changes of hip joint 5.  Chest x-ray is not done yet.   ASSESSMENT AND PLAN: A 59 year old male who has a past history of dialysis, diabetes, hypertension, and sent from dialysis center today because of his fever.   1.  Systemic inflammatory response syndrome.  This is evidenced by fever, tachycardia, and elevated white cell count. We will collect a blood culture and start him on cefepime and vancomycin for now. There is no infection or induration around his PermCath side and I do not have any clear source of infection yet. We still have to wait for chest x-ray.  2.  End-stage renal disease on hemodialysis. Nephrology consult to help with continuing dialysis in hospital.  3.  Hypertension. Continue carvedilol, lisinopril, isosorbide mononitrate in the hospital.  4.  Diabetes. We will continue his NovoLog 70/30 and put on insulin sliding scale coverage. 5.  Diabetic neuropathy and chronic pain.  He is on oxycodone at home, we will continue the same over here.   CODE STATUS: Full code.   TOTAL TIME SPENT ON THIS ADMISSION: 50 minutes.     ____________________________ Hope PigeonVaibhavkumar G. Elisabeth PigeonVachhani, MD vgv:bu D: 11/12/2014 15:44:24 ET T: 11/12/2014 16:58:53 ET JOB#: [id number]  cc: Hope PigeonVaibhavkumar G. Elisabeth PigeonVachhani, MD, <Dictator> Dr. Arnell SievingSingh Alexander Paraschos, MD Altamese DillingVAIBHAVKUMAR Kurt Azimi MD ELECTRONICALLY SIGNED 11/28/2014 11:45

## 2015-01-12 NOTE — Op Note (Signed)
PATIENT NAMLuvenia [name]:  Stoecker, Anh MR#:  [id number] DATE OF BIRTH:  August 25, 1956  DATE OF PROCEDURE:  12/06/2014  PREOPERATIVE DIAGNOSES:  1.  Complication of dialysis device with nonfunction of left internal jugular catheter.  2.  End-stage renal disease requiring hemodialysis.  3.  Superior vena cava syndrome.   POSTOPERATIVE DIAGNOSES:  1.  Complication of dialysis device with nonfunction of left internal jugular catheter.  2.  End-stage renal disease requiring hemodialysis.  3.  Superior vena cava syndrome.  PROCEDURES PERFORMED: 1.  Insertion of right femoral venous tunneled dialysis catheter.  2.  Removal of right internal jugular tunneled dialysis catheter.   SURGEON: Renford DillsGregory G. Schnier, MD   SEDATION: Versed plus fentanyl.   CONTRAST USED: None.   FLUOROSCOPY TIME: Approximately 0.6 minutes.   ACCESS: Right common femoral vein for new catheter insertion.   INDICATIONS: Mr. [name] is a 59 year old gentleman who was sent back from dialysis, as they were unable dialyze him from his tunneled catheter. The risks and benefits for insertion of a new catheter at a new site and removal of nonfunctioning catheter were reviewed. The patient agrees to proceed.   DESCRIPTION OF PROCEDURE: The patient is taken to the special procedure suite, placed in the supine position. After adequate sedation is achieved, the right groin is prepped and draped in a sterile fashion. Ultrasound is placed in a sterile sleeve. Common femoral vein is identified. It is echolucent and compressible, indicating patency. Image is recorded for the permanent record. Under real-time visualization, the lidocaine is infiltrated and, subsequently, a Seldinger needle is inserted. J-wire is advanced under fluoroscopy without difficulty. A 5 French sheath is inserted and the J-wire is positioned at the junction of the inferior vena cava with the atrium. It is then measured; a distance of 45 cm is identified, and so the 44 cm tip-to-cuff  Palindrome catheter is selected. It is then opened onto the field. The J-wire is exchanged for an Amplatz wire. Serial dilatation is performed. A counterincision made at the wire insertion site and subsequently the dilator peel-away sheath is inserted. The catheter is advanced over the wire through the peel-away sheath and the wire and peel-away sheath are removed. Exit site is then selected more distally in the thigh, 1% lidocaine is infiltrated, a small incision is made, and the tunneling device is passed. The catheter is then pulled subcutaneously and the cuff is positioned. The tip location is verified using fluoroscopic guidance. The catheter is then transected and the hub assembly connected without difficulty. Both lumens aspirate and flush easily. Under fluoroscopy, the catheter has a smooth contour with their tips in a proper position. The catheter is then packed with 5000 units of heparin per lumen. Counterincision is closed with 4-0 Monocryl subcuticular and Dermabond. The catheter is secured to the skin of the thigh with 0 silk and a sterile dressing is applied. The patient was then repositioned and using a hemostat the left IJ catheter cuff is freed from the surrounding tissues after lidocaine has been infiltrated, and subsequently, the catheter is removed and pressure is held, and there are no immediate complications.   SUMMARY: Successful placement of a right femoral cuffed tunneled dialysis catheter with removal of the left jugular catheter as well.     ____________________________ Renford DillsGregory G. Schnier, MD ggs:bm D: 12/06/2014 16:57:38 ET T: 12/07/2014 02:16:14 ET JOB#: [id number]  cc: Renford DillsGregory G. Schnier, MD, <Dictator> Munsoor Lizabeth LeydenN. Lateef, MD

## 2015-01-12 NOTE — Op Note (Signed)
PATIENT NAMLuvenia Redden:  [name], [name] MR#:  [id number] DATE OF BIRTH:  [birth year]  DATE OF PROCEDURE:  12/26/2014.  PREOPERATIVE DIAGNOSES:  1. End-stage renal disease.  2. Nonfunctional left femoral PermCath.  3. Hypertension.  4. Multiple failed previous dialysis accesses, including infection issues requiring graft excisions.  5. Diabetes.   POSTOPERATIVE DIAGNOSES:  1. End-stage renal disease.  2. Nonfunctional left femoral PermCath.  3. Hypertension.  4. Multiple failed previous dialysis accesses, including infection issues requiring graft excisions.  5. Diabetes.   PROCEDURE:   1. Removal and replacement through same venous access left femoral PermCath, replacing with a 44 cm tip to cuff, tunneled hemodialysis catheter.  2. Fluoroscopic guidance for placement of catheter.   SURGEON: Annice NeedyJason S. Aleatha Taite, MD.   ANESTHESIA:  Local with moderate conscious sedation.   ESTIMATED BLOOD LOSS:  25 mL.   INDICATION FOR PROCEDURE:  A 59 year old gentleman with end-stage renal disease.  His PermCath which was recently placed is not working well.  It will not pull correctly for dialysis and his dialysis center has asked that we exchange this.  Risks and benefits were discussed. Informed consent was obtained.   DESCRIPTION OF PROCEDURE:  The patient was brought to the vascular suite.  His existing catheter, left groin, and thigh were sterilely prepped and draped and a sterile surgical field was created.  An Amplatz Super Stiff wire was placed through the previous catheter.  The fibrous cuff surrounding the left femoral PermCath was not particularly well developed and this catheter was easily removed with gentle traction.  The sheath was then placed over the wire.  Using fluoroscopic guidance, I selected a 44 cm tip to cuff, tunneled hemodialysis catheter.  His previous catheter was 55 and was parked in the right atrium, but this was not working well, so we decided to leave this short.  We parked this in the  retrohepatic and slightly suprarenal vena cava under fluoroscopic guidance and removed the wire.  The catheter withdrew blood well and flushed easily with heparinized saline, and a concentrated heparin solution was placed.  I then secured the catheter to the leg with 2 Prolene sutures, and a new 4-0 Monocryl pursestring suture was placed around the exit site.  Sterile dressing was placed.  The patient tolerated the procedure well.     ____________________________ Annice NeedyJason S. Chari Parmenter, MD jsd:kc D: 12/26/2014 17:13:31 ET T: 12/26/2014 17:31:07 ET JOB#: [id number]  cc: Annice NeedyJason S. Brigid Vandekamp, MD, <Dictator>

## 2015-01-12 NOTE — Discharge Summary (Signed)
PATIENT NAMLuvenia Redden:  [name], [name] MR#:  [id number] DATE OF BIRTH:  Dec 25, 1955  DATE OF ADMISSION:  12/19/2014 DATE OF DISCHARGE:  12/24/2014  ADMITTING DIAGNOSIS: Dialysis site infection.   DISCHARGE DIAGNOSES: 1.  Infected right groin PermCath infection status post removal of the catheter, cultures growing Coagulate-negative bacteria. Final identification is not identified due to the nature of the bacteria.  2.  Status post left femoral vein ultrasound guidance for vascular access with a PermCath.  3.  End-stage renal disease.  4.  Diabetes with hypoglycemia due to poor p.o. intake.  5.  Generalized deconditioning and weakness.  6.  Hypertension.  7.  Diabetic neuropathy.  8.  History of hyperlipidemia.  9.  Status post AV fistula in the left arm.   CONSULTANTS: Dr. Sampson GoonFitzgerald.   PERTINENT LABS AND EVALUATIONS: Glucose 90, BUN 30, creatinine 6.08, sodium 138, potassium 3.7, chloride 98, CO2 27, calcium 8.7. WBC 11, hemoglobin 10.5, platelet count 224,000.   Catheter tip shows Coag-negative Staphylococcus. Blood cultures x2: No growth.   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 No other abnormalities.   HOSPITAL COURSE: Please refer to H and P done by the admitting physician. The patient is a 59 year old African American male with end-stage renal disease, on hemodialysis. He was recently in the hospital and discharged to rehab. He was seen at [facility] dialysis by Dr. Thedore MinsSingh. He was noted to have pus around his right PermCath. At that time, he received IV vancomycin and told to come to the ED. The patient was seen by Dr. Wyn Quakerew who removed the PermCath from the groin. The patient had blood cultures that remained negative. He was seen in consultation by infectious disease who recommended continuing vancomycin, total 2 weeks. The patient subsequently yesterday had his PermCath placed in the left femoral vein and was dialyzed yesterday, has tolerated dialysis well. He will be dialyzed again today  and is stable for discharge.   DISCHARGE MEDICATIONS: Mucinex 1 tab p.o. at bedtime, carvedilol 3.125 one tab at bedtime, isosorbide mononitrate 30 daily, lisinopril 2.5 p.o. daily, ketorolac 10 mg q. 8 p.r.n. for pain, cyclobenzaprine 10 mg q. 8 p.r.n., Aspercreme to effected areas every 8 hours, fluticasone 50 mcg 2 sprays daily, aspirin 325 p.o. daily, omeprazole 20 two tabs at bedtime daily, Rena Caps 1 tab p.o. daily, Sensipar 30 daily, spironolactone 25 p.o. daily, oxycodone 10 mg q. 6 p.r.n. for pain, OxyContin 20 mg 1 tab p.o. q. 12, insulin 4 units b.i.d., Docusate 100 mg 2 tabs 2 times a day, vancomycin dose per nephrology x10 more days.   DISCHARGE DIET: Carbohydrate controlled diet.   DISCHARGE ACTIVITY: As tolerated.   DISCHARGE INSTRUCTIONS: PT evaluation and follow-up with primary MD in 1 to 2 weeks, HD per nephrology.   TIME SPENT ON DISCHARGE: 35 minutes.  ____________________________ Lacie ScottsShreyang H. Allena KatzPatel, MD shp:sb D: 12/24/2014 09:14:03 ET T: 12/24/2014 10:04:50 ET JOB#: [id number]  cc: Jazminn Pomales H. Allena KatzPatel, MD, <Dictator> Charise CarwinSHREYANG H Happy Ky MD ELECTRONICALLY SIGNED 12/25/2014 12:19

## 2015-01-12 NOTE — Discharge Summary (Signed)
PATIENT NAMLuvenia [name]:  Neumeyer, Bradly MR#:  [id number] DATE OF BIRTH:  Jan 21, 1956  DATE OF ADMISSION:  12/19/2014 DATE OF DISCHARGE:  12/25/2014  ADDENDUM:   ADMITTING DIAGNOSIS: Dialysis site infection.   DISCHARGE DIAGNOSES:  1.  Infected right groin. PermCath infection status post removal of the catheter. Cultures growing coagulase-negative bacteria, seen by infectious disease. Recommended to be treated by vancomycin.  2.  Status post left femoral vein ultrasound guidance for vascular access with PermCath.  3.  End-stage renal disease.  4.  Diabetes with hypoglycemia due to poor p.o. intake, generalized deconditioning and weakness.  5.  Hypertension, essential.  6.  Diabetic neuropathy.  7.  Hyperlipidemia which is mixed, unspecified.  8.  Status post arteriovenous fistula in the left arm.  9.  Constipation.   Please refer to the discharge summary done yesterday for details. The patient was kept in the hospital because he did not have a bowel movement. He was given bowel regimen. He did have a small bowel movement. He will be given another bowel regimen today. The rest of his medications and other stuff is unchanged.   TIME SPENT ON THIS DISCHARGE: 35 minutes.    ____________________________ Lacie ScottsShreyang H. Allena KatzPatel, MD shp:at D: 12/25/2014 11:02:24 ET T: 12/25/2014 12:52:06 ET JOB#: [id number]  cc: Zariel Capano H. Allena KatzPatel, MD, <Dictator> Charise CarwinSHREYANG H Jalah Warmuth MD ELECTRONICALLY SIGNED 01/05/2015 8:28

## 2015-01-12 NOTE — Op Note (Signed)
PATIENT NAMLuvenia [name]:  [name], [name] MR#:  [id number] DATE OF BIRTH:  12/29/1955  DATE OF PROCEDURE:  12/31/2014  PREOPERATIVE DIAGNOSES: 1. Complication of dialysis device with inability to use his left femoral tunneled catheter.  2. End-stage renal disease requiring hemodialysis.  3. Superior vena cava syndrome.   POSTOPERATIVE DIAGNOSES: 1. Complication of dialysis device with inability to use his left femoral tunneled catheter.  2. End-stage renal disease requiring hemodialysis.  3. Superior vena cava syndrome.   PROCEDURES PERFORMED: 1. Insertion of right femoral cuffed tunneled dialysis catheter with ultrasound and fluoroscopic guidance.   2. Removal of left femoral tunneled dialysis catheter.   SURGEON: Levora DredgeGregory Karmon Andis, MD   SEDATION:  Versed plus fentanyl.   CONTRAST USED: None.   FLUOROSCOPY TIME: 0.8 minutes.   INDICATIONS: [name] is a 59 year old gentleman who was sent directly from the dialysis center secondary to inability to do his dialysis. His catheter is not working. This catheter has already been exchanged over a wire recently and, therefore, an entirely new site will be selected. Risks and benefits are reviewed. All questions answered. The patient has agreed to proceed.   DESCRIPTION OF PROCEDURE: The patient is taken to special procedures and placed in the supine position. After adequate sedation is achieved, the right groin is prepped and draped in a sterile fashion. Ultrasound is placed in a sterile sleeve. Femoral vein is identified. It is echolucent and compressible indicating patency. Image is recorded for the permanent record and under real-time visualization, 1% lidocaine is infiltrated in the soft tissues, and then a Seldinger needle is inserted. Amplatz Super Stiff wire is then advanced under fluoroscopic guidance without difficulty up to the atrium.   A small counterincision is made at the wire insertion site, dilators passed over the wire and the dilator and  peel-away sheath is inserted. A 44 cm tip to cuff Palindrome catheter is then advanced over the wire through the peel-away sheath and the peel-away sheath is removed.  Under fluoroscopic guidance, the wire is removed. The catheter is then flushed with sterile saline. With the catheter in place, 20 mL of blood is aspirated after 20 mL is wasted and this is sent off for heparin-induced thrombocytopenia assay.   The exit site is selected. Lidocaine 1% is infiltrated in the soft tissues and a small incision is made. Tunneling device is passed subcutaneously, and the catheter is pulled through the subcutaneous tunnel. It is then transected and the hub assembly is connected. Both lumens aspirate and flush easily, and under fluoroscopic guidance, the catheter has a smooth, straight contour. Both lumens are then packed with 1 mL of t-PA. Biopatch is applied to the exit site, and the catheter is secured to the skin of the thigh with 0 silk. Monocryl 4-0 and Dermabond are used to close the counterincision. Sterile dressing is applied.   Attention is then turned to the left side, which is prepped and draped in a sterile fashion. Lidocaine 1% is infiltrated in the soft tissues. A small incision is created and the cuff from the existing catheter is freed from the surrounding tissues. The catheter is then removed and pressure is held for 5 minutes. Antibiotic ointment and a sterile dressing is applied at the exit site. The patient tolerated the procedure without changes in his status and he is taken to the recovery area in stable condition.    ____________________________ Renford DillsGregory G. Deloris Mittag, MD ggs:tr D: 12/31/2014 11:15:30 ET T: 12/31/2014 12:26:26 ET JOB#: [id number]  cc: Renford DillsGregory G. Pedro Oldenburg,  MD, <Dictator> Dr. Nile Riggs MD ELECTRONICALLY SIGNED 01/07/2015 12:48

## 2015-01-12 NOTE — Op Note (Signed)
PATIENT NAMLuvenia Redden:  [name], [name] MR#:  [id number] DATE OF BIRTH:  [birth year]  DATE OF PROCEDURE:  12/06/2014  PREOPERATIVE DIAGNOSES:  1.  Complication of dialysis device with nonfunction of left internal jugular catheter.  2.  End-stage renal disease requiring hemodialysis.  3.  Superior vena cava syndrome.   POSTOPERATIVE DIAGNOSES:  1.  Complication of dialysis device with nonfunction of left internal jugular catheter.  2.  End-stage renal disease requiring hemodialysis.  3.  Superior vena cava syndrome.  PROCEDURES PERFORMED: 1.  Insertion of right femoral venous tunneled dialysis catheter.  2.  Removal of right internal jugular tunneled dialysis catheter.   SURGEON: Renford DillsGregory G. Schnier, MD   SEDATION: Versed plus fentanyl.   CONTRAST USED: None.   FLUOROSCOPY TIME: Approximately 0.6 minutes.   ACCESS: Right common femoral vein for new catheter insertion.   INDICATIONS: [name] who was sent back from dialysis, as they were unable dialyze him from his tunneled catheter. The risks and benefits for insertion of a new catheter at a new site and removal of nonfunctioning catheter were reviewed. The patient agrees to proceed.   DESCRIPTION OF PROCEDURE: The patient is taken to the special procedure suite, placed in the supine position. After adequate sedation is achieved, the right groin is prepped and draped in a sterile fashion. Ultrasound is placed in a sterile sleeve. Common femoral vein is identified. It is echolucent and compressible, indicating patency. Image is recorded for the permanent record. Under real-time visualization, the lidocaine is infiltrated and, subsequently, a Seldinger needle is inserted. J-wire is advanced under fluoroscopy without difficulty. A 5 French sheath is inserted and the J-wire is positioned at the junction of the inferior vena cava with the atrium. It is then measured; a distance of 45 cm is identified, and so the 44 cm tip-to-cuff  Palindrome catheter is selected. It is then opened onto the field. The J-wire is exchanged for an Amplatz wire. Serial dilatation is performed. A counterincision made at the wire insertion site and subsequently the dilator peel-away sheath is inserted. The catheter is advanced over the wire through the peel-away sheath and the wire and peel-away sheath are removed. Exit site is then selected more distally in the thigh, 1% lidocaine is infiltrated, a small incision is made, and the tunneling device is passed. The catheter is then pulled subcutaneously and the cuff is positioned. The tip location is verified using fluoroscopic guidance. The catheter is then transected and the hub assembly connected without difficulty. Both lumens aspirate and flush easily. Under fluoroscopy, the catheter has a smooth contour with their tips in a proper position. The catheter is then packed with 5000 units of heparin per lumen. Counterincision is closed with 4-0 Monocryl subcuticular and Dermabond. The catheter is secured to the skin of the thigh with 0 silk and a sterile dressing is applied. The patient was then repositioned and using a hemostat the left IJ catheter cuff is freed from the surrounding tissues after lidocaine has been infiltrated, and subsequently, the catheter is removed and pressure is held, and there are no immediate complications.   SUMMARY: Successful placement of a right femoral cuffed tunneled dialysis catheter with removal of the left jugular catheter as well.     ____________________________ Renford DillsGregory G. Schnier, MD ggs:bm D: 12/06/2014 16:57:00 ET T: 12/07/2014 02:16:14 ET JOB#: [id number]  cc: Lennox PippinsMunsoor N. Lateef, MD Renford DillsGregory G. Schnier, MD, <Dictator>  Renford DillsGREGORY G SCHNIER MD ELECTRONICALLY SIGNED 12/24/2014 15:09

## 2015-01-12 NOTE — Op Note (Signed)
PATIENT NAMLuvenia Redden:  [name], [name] MR#:  [id number] DATE OF BIRTH:  November 19, 1955  DATE OF PROCEDURE:  12/19/2014  PREOPERATIVE DIAGNOSES:  1.  End-stage renal disease.  2.  Infected PermCath right femoral vein.  3.  Hypertension.   POSTOPERATIVE DIAGNOSES: 1.  End-stage renal disease.  2.  Infected PermCath right femoral vein.  3.  Hypertension.   PROCEDURES:  Removal of right femoral PermCath.   SURGEON: Annice NeedyJason S. Dew, M.D.   ANESTHESIA: Local.   ESTIMATED BLOOD LOSS: Minimal.   INDICATION FOR PROCEDURE: This is a 59 year old gentleman well-known to us for his dialysis access needs. He is admitted from his dialysis access center due to purulence and tenderness from the right femoral catheter. This needs to be removed.  Risks and benefits were discussed. Informed consent was obtained.   DESCRIPTION OF PROCEDURE: The patient was laid flat in his ER bed. His groin was sterilely prepped and draped, and a sterile surgical field was created.  The area was anesthetized copiously with 1% lidocaine.  Hemostats were used to help dissect out the cuff which was not particularly well incorporated.  Gentle traction was then used to remove the catheter in its entirety. The catheter tip was sent for culture.  Sterile dressing was placed. ____________________________ Annice NeedyJason S. Dew, MD jsd:sp D: 12/19/2014 14:14:00 ET T: 12/19/2014 16:42:17 ET JOB#: [id number] Annice NeedyJASON S DEW MD ELECTRONICALLY SIGNED 01/02/2015 12:50

## 2015-01-12 NOTE — Op Note (Signed)
PATIENT NAMLuvenia [name]:  [name], [name] MR#:  [id number] DATE OF BIRTH:  [birth year]  DATE OF PROCEDURE:  12/23/2014  PREOPERATIVE DIAGNOSES:  1.  End-stage renal disease.  2.  Recently removed infected PermCath right groin.  3.  Multiple previous failed dialysis access.  4.  Hypertension.   POSTOPERATIVE DIAGNOSES:  1.  End-stage renal disease.  2.  Recently removed infected PermCath right groin.  3.  Multiple previous failed dialysis access.  4.  Hypertension.    PROCEDURES:  1.  Ultrasound guidance for vascular access to left femoral vein.  2.  Using fluoroscopic guidance, placement of catheter.  3.  Placement of a 55 cm tip to cuff tunneled hemodialysis catheter, left femoral vein.   SURGEON: Annice NeedyJason S Dew, MD   ANESTHESIA: Local with moderate conscious sedation.   ESTIMATED BLOOD LOSS: Minimal.   INDICATION FOR PROCEDURE: This is a gentleman well-known to us for his dialysis access needs.  He has had multiple previous dialysis accesses.  He has had to have graft excised for infection.  He had a PermCath that was removed for infection last week.  He now needs a new dialysis access and the PermCath replaced.  Risks and benefits were discussed. Informed consent was obtained.   DESCRIPTION OF PROCEDURE: The patient is brought to the vascular suite.  Left groin was sterilely prepped and draped and a sterile surgical field was created.  Under direct ultrasound guidance, the left femoral vein was visualized and found to be patent. It was accessed under direct ultrasound guidance without difficulty with a Seldinger needle and a permanent image was recorded.  A J-wire was then placed.  After skin nick and dilatation, we placed the peel-away sheath over the wire.  The peel-away sheath was a bit rigid, would not make the turn in the iliac  vessel somewhat kinking the J-wire; however, it was into the vein enough that we were able to pass the catheter.   Using fluoroscopic guidance, I selected a 55 cm tip to  cuff the catheter and tunneled from the counter incision in the anterior thigh and placed this through the peel-away sheath and using fluoroscopic guidance parked the catheter in the right atrium.  The cuff was about 75% of the way from the access incision to the exit site; closer to the exit site.  Both lumens withdrew dark red non-pulsatile blood and flushed easily with heparinized saline and then a concentrated heparin solution was then placed.  The catheter was secured to the leg with 2 Prolene sutures. A 4-0 Monocryl pursestring suture was placed around the catheter exit site and a 4-0 Monocryl suture was used to close the access site. Sterile dressings were placed. The patient tolerated the procedure well and was taken to the recovery room in stable condition.      ____________________________ Annice NeedyJason S. Dew, MD jsd:DT D: 12/23/2014 09:10:19 ET T: 12/23/2014 09:33:03 ET JOB#: [id number]  cc: Annice NeedyJason S. Dew, MD, <Dictator> Annice NeedyJASON S DEW MD ELECTRONICALLY SIGNED 01/02/2015 12:50

## 2015-01-12 NOTE — H&P (Signed)
PATIENT NAMLuvenia [name]:  [name], [name] MR#:  [id number] DATE OF BIRTH:  04-27-1956  DATE OF ADMISSION:  12/19/2014  PRIMARY CARE PHYSICIAN: [name], M.D.   REQUESTING PHYSICIAN:  [name].  CHIEF COMPLAINT: Dialysis site infection.   HISTORY OF PRESENT ILLNESS: The patient is a 59 year old male with a known history of end-stage renal disease on hemodialysis.  Was seen at [facility] by [name] where he was noted to have pus around his right PermCath.  Two sets of blood cultures were drawn and was given IV vancomycin at dialysis and was requested to come to the hospital for removal of the permanent catheter and antibiotic treatment instead of him going to the vascular lab at the Emergency Department.  He did get his PermCath removed by [name] in the ER and he is being admitted for further evaluation and management per [name].  He had a lot of pus expressed from the site of dialysis catheter and catheter has been sent out for culture. The patient denies any symptoms at all.   REVIEW OF SYSTEMS:    CONSTITUTIONAL: No fever. No fatigue, weakness, pain, or weight loss.  EYES: No blurring or double vision, discharge, or redness.  EARS, NOSE, THROAT: No tinnitus, ear pain, or hearing loss.  RESPIRATORY: No cough, No shortness of breath.  CARDIOVASCULAR: No chest pain, orthopnea, edema, arrhythmia, palpitation.  GASTROINTESTINAL: No nausea, vomiting, diarrhea, abdominal pain.  GENITOURINARY: No dysuria, hematuria, increased frequency.  ENDOCRINE: No heat or cold intolerance. No excessive sweating.  SKIN: Dialysis site infection, pus around insertion site of permacath. MUSCULOSKELETAL: No pain or swelling in the joints.  NEUROLOGICAL: No numbness, weakness, tremor, or vertigo.  PSYCHIATRIC: No anxiety, insomnia, bipolar disorder.   PAST MEDICAL HISTORY:  1. Hypertension.  2. End-stage renal disease on hemodialysis.  3. Diabetic neuropathy.  4. History of hyperlipidemia.   PAST SURGICAL  HISTORY: AV fistula access in left arm.   FAMILY HISTORY: Negative for hypertension or diabetes.   SOCIAL HISTORY: He does not smoke. He quit drinking in 1979. Does not do drugs and he is on hemodialysis. Independent in day-to-day activities.   MEDICATIONS AT HOME:  1.  Acetaminophen/hydrocodone 325/5 mg 1 tablet p.o. every 6 hours as needed.  2.  Aldactone 25 mg p.o. daily.  3.  Bayer aspirin 325 mg p.o. daily.  4.  Coreg 3.125 mg p.o. daily.  5.  Cyclobenzaprine 10 mg p.o. every 8 hours as needed.  6.  Flonase 2 sprays intranasally daily.  7.  Insulin 70/30 10 units subcutaneous twice a day.  8.  Imdur 30 mg p.o. daily. 9.  Ketoconazole topical 2% to affected area once daily as needed. 10.  Ketorolac 10 mg p.o. every 8 hours as needed.  11.  Lidocaine 2% every 4 hours as needed.  12.  Lisinopril 2.5 mg p.o. daily.  13.  Mucinex D 1 tablet p.o. at bedtime.  14.  Omeprazole 20 mg p.o. daily.  15.  Oxycodone 10 mg p.o. every 6 hours as needed.  16.  Oxycodone 20 mg p.o. b.i.d.  17.  Renal Caps once daily. 18.  Sensipar 30 mg p.o. daily.   PHYSICAL EXAMINATION:  VITAL SIGNS: Temperature 98.4, heart rate 96 per minute, respirations 18 per minute, blood pressure 116/84. He is saturating 100% on room air.  GENERAL:  The patient is a 59 year old male lying in the bed comfortably without any acute distress.  EYES: Pupils equal, reactive to light and accommodation. No scleral icterus.  Extraocular muscles intact.  HEENT: Head atraumatic, normocephalic. Oropharynx and nasopharynx clear.  NECK: Supple. No jugular venous distention. No thyroid enlargement.  LUNGS: Clear to auscultation bilaterally. No wheezing, rales, or crepitation.  CARDIOVASCULAR: S1, S2 normal. No murmurs or rubs. ABDOMEN: Soft, nontender, nondistended. Bowel sounds present. No organomegaly or mass.  EXTREMITIES: No pedal edema, cyanosis, or clubbing.  NEUROLOGIC: Nonfocal examination. Cranial nerves II through XII  intact.  Muscle strength 5/5 in all extremities. Sensation intact.  PSYCHIATRIC: The patient is alert and oriented x 3. SKIN:  He has right PermCath site dressed up with significant pustular area at the insertion site itself.  The catheter has been removed at the bedside.  MUSCULOSKELETAL: No joint effusion or tenderness.   LABORATORY PANEL: None.   IMPRESSION AND PLAN:  1.  Dialysis access site infection. The catheter has been removed by Dr. Festus Barren at the bedside as there was significant redness and pain along with pus at the site of the catheter insertion.  Will give him IV vancomycin likely tomorrow as he has already received 1 at the dialysis today.  Will need probably temporary catheter placement for hemodialysis in the hospital. Will consult vascular along with nephrology. Will have repeat blood cultures in the next 48 hours to see clearance of bacteremia if he has one.  Will consult infectious disease.  2.  End-stage renal disease on hemodialysis.  Will consult nephrology for hemodialysis need.  3.  Diabetes. Will continue his home regimen of 70/30 and add sliding scale insulin.  4.  Diabetic neuropathy and chronic pain. Will continue his home dose of oxycodone and other pain medication.  Monitor while here and adjust as needed.  5.  Hypertension. Will continue home medication and adjust as needed.   CODE STATUS: Full code.   Total time taking care of this patient is 40 minutes.    ____________________________ [name] Sia. Sherryll Burger, [name] vss:sp D: 12/19/2014 13:47:00 ET T: 12/19/2014 14:11:48 ET JOB#: [id number]  cc: [name] Pigeon, [name] [name] Jurczyk S. Sherryll Burger, [name], <Dictator>   [name] ELECTRONICALLY SIGNED 12/22/2014 13:22

## 2015-01-12 NOTE — Discharge Summary (Signed)
Dates of Admission and Diagnosis:  Date of Admission 12-Nov-2014   Date of Discharge 15-Nov-2014   Admitting Diagnosis Fever   Final Diagnosis 1. Right healthcare acquired pneumonia 2. End stage renal disease on hemodialysis 3. Chronic systolic congestive heart failure  4. hypertension  5. DIABETES MELLITUS  6. Chronic back pain 7. Anemia of Chronic Disease    Chief Complaint/History of Present Illness CHIEF COMPLAINT: Fever.   HISTORY OF PRESENT ILLNESS:  A 59 year old male who has history of hypertension, end-stage renal disease on hemodialysis Tuesday, Thursday, and Saturday, diabetic neuropathy, and hyperlipidemia. He did not have any complaint until yesterday, he said that yesterday he had some chills and feeling a little warm, but did not pay much attention, has little bit of cough with slight sputum production and today when he went for his dialysis he was noted to have fever 101, so he was sent to Emergency Room for further evaluation from dialysis after doing 2 hours of hemodialysis. He was noted to have slight tachycardia and elevated white cell count, so given to hospitalist team for further management of this issue. On questioning he denies any shortness of breath. He does not make any urine. He does not have any open wounds on the skin.  His fistula on left arm stopped working and so he was given PermCath 3 weeks ago and the plan was for him to go to a vascular doctor to have his mapping on the right arm done for future dialysis access.   Allergies:  Penicillin: Hives  Pertinent Past History:  Pertinent Past History PAST MEDICAL HISTORY:  1. Hypertension.  2. End-stage renal disease on hemodialysis.  3. Diabetic neuropathy.  4. History of hyperlipidemia.   Hospital Course:  Hospital Course 9657 m with ESRD on HD thru recent permcath, DIABETES MELLITUS, hypertension, Chronic back pain here with sepsis  * Sepsis - Due to Right HCAP Await blood cx which are negative On  broad spectrum abx. Change to levaquin. Some purulent discharge noticed initially around permcath but nothing later.  * Chronic systolic chf stable  * ESRD on HD Continue per nephrology. permcath in place  * Anemia of Chronic Disease stable  * hypertension - blood pressure  stable  * DIABETES MELLITUS  * Chronic back and hip pain On pain meds  Will need OP f/u with vacular for AV Graft.  Time spent discharge 35 minutes   Condition on Discharge Fair   Code Status:  Code Status Full Code   PHYSICAL EXAM ON DISCHARGE:  Physical Exam:  GEN obese   NECK supple   RESP normal resp effort  clear BS  no use of accessory muscles   CARD regular rate   ABD soft   PSYCH alert, A+O to time, place, person   Additional Comments Permcath   VITAL SIGNS:  Vital Signs: **Vital Signs.:   04-Mar-16 07:44  Vital Signs Type Routine  Temperature Temperature (F) 97.8  Temperature Source oral  Pulse Pulse 94  Respirations Respirations 18  Systolic BP Systolic BP 144  Diastolic BP (mmHg) Diastolic BP (mmHg) 57  Mean BP 86  Pulse Ox % Pulse Ox % 93  Pulse Ox Activity Level  At rest  Oxygen Delivery Room Air/ 21 %   DISCHARGE INSTRUCTIONS HOME MEDS:  Medication Reconciliation: Patient's Home Medications at Discharge:     Medication Instructions  bayer aspirin regimen 325 mg oral enteric coated tablet  1 tab(s) orally once a day (in the morning)  renal caps oral capsule  1 cap(s) orally once a day   sensipar 30 mg oral tablet  1 tab(s) orally once a day (in the evening)   nexium 40 mg oral delayed release capsule  1 cap(s) orally once a day   mucinex d 600 mg-60 mg oral tablet, extended release  1 tab(s) orally once a day (at bedtime)   tums 1000 mg oral tablet, chewable  3 tab(s) orally 2 times a day   carvedilol 3.125 mg oral tablet  1 tab(s) orally once a day (at bedtime)   isosorbide mononitrate 30 mg oral tablet, extended release  1 tab(s) orally once a day    lisinopril 2.5 mg oral tablet  1 tab(s) orally once a day   aldactone 25 mg oral tablet  1 tab(s) orally once a day   ketorolac 10 mg oral tablet  1 tab(s) orally every 8 hours, As Needed - for Pain   oxycodone 10 mg oral tablet  1 tab(s) orally every 6 hours, As Needed - for Pain   cyclobenzaprine 10 mg oral tablet  1 tab(s) orally every 8 hours, As Needed - for Pain   oxycodone 20 mg oral tablet, extended release  1 tab(s) orally every 12 hours   insulin aspart-insulin aspart protamine 30 units-70 units/ml subcutaneous suspension  10 unit(s) subcutaneous 2 times a day (before meals)   nepro carb steady oral supplement  237 milliliter(s) orally 2 times a day   levaquin 250 mg oral tablet  1 tab(s) orally 2 times a day    STOP TAKING THE FOLLOWING MEDICATION(S):    vancomycin: 1000 milligram(s) iv 3 times a week with each Hemodialysis  furosemide 40 mg oral tablet: 1 tab(s) orally once a day  Physician's Instructions:  Diet Renal Diet   Activity Limitations As tolerated  with assistance   Return to Work Not Applicable   Time frame for Follow Up Appointment 1-2 weeks  Dr. Wyn Quaker   Electronic Signatures: Elpidio Anis, Andreas Blower (MD)  (Signed [phone] 13:21)  Authored: ADMISSION DATE AND DIAGNOSIS, CHIEF COMPLAINT/HPI, Allergies, PERTINENT PAST HISTORY, HOSPITAL COURSE, PHYSICAL EXAM ON DISCHARGE, VITAL SIGNS, DISCHARGE INSTRUCTIONS HOME MEDS, PATIENT INSTRUCTIONS   Last Updated: 04-Mar-16 13:21 by Minette Headland (MD)

## 2015-01-14 ENCOUNTER — Inpatient Hospital Stay
Admission: AD | Admit: 2015-01-14 | Discharge: 2015-02-12 | DRG: 314 | Disposition: E | Payer: Medicare Other | Source: Ambulatory Visit | Attending: Specialist | Admitting: Specialist

## 2015-01-14 DIAGNOSIS — E46 Unspecified protein-calorie malnutrition: Secondary | ICD-10-CM | POA: Diagnosis not present

## 2015-01-14 DIAGNOSIS — Z4659 Encounter for fitting and adjustment of other gastrointestinal appliance and device: Secondary | ICD-10-CM

## 2015-01-14 DIAGNOSIS — F329 Major depressive disorder, single episode, unspecified: Secondary | ICD-10-CM | POA: Diagnosis present

## 2015-01-14 DIAGNOSIS — J9601 Acute respiratory failure with hypoxia: Secondary | ICD-10-CM | POA: Diagnosis present

## 2015-01-14 DIAGNOSIS — Y838 Other surgical procedures as the cause of abnormal reaction of the patient, or of later complication, without mention of misadventure at the time of the procedure: Secondary | ICD-10-CM | POA: Diagnosis not present

## 2015-01-14 DIAGNOSIS — Z992 Dependence on renal dialysis: Secondary | ICD-10-CM | POA: Diagnosis not present

## 2015-01-14 DIAGNOSIS — N2581 Secondary hyperparathyroidism of renal origin: Secondary | ICD-10-CM | POA: Diagnosis present

## 2015-01-14 DIAGNOSIS — E1140 Type 2 diabetes mellitus with diabetic neuropathy, unspecified: Secondary | ICD-10-CM | POA: Diagnosis present

## 2015-01-14 DIAGNOSIS — A419 Sepsis, unspecified organism: Secondary | ICD-10-CM | POA: Diagnosis not present

## 2015-01-14 DIAGNOSIS — R6521 Severe sepsis with septic shock: Secondary | ICD-10-CM | POA: Diagnosis present

## 2015-01-14 DIAGNOSIS — Z66 Do not resuscitate: Secondary | ICD-10-CM | POA: Diagnosis not present

## 2015-01-14 DIAGNOSIS — J690 Pneumonitis due to inhalation of food and vomit: Secondary | ICD-10-CM | POA: Diagnosis present

## 2015-01-14 DIAGNOSIS — N186 End stage renal disease: Secondary | ICD-10-CM | POA: Diagnosis present

## 2015-01-14 DIAGNOSIS — E11319 Type 2 diabetes mellitus with unspecified diabetic retinopathy without macular edema: Secondary | ICD-10-CM | POA: Diagnosis present

## 2015-01-14 DIAGNOSIS — E039 Hypothyroidism, unspecified: Secondary | ICD-10-CM | POA: Diagnosis present

## 2015-01-14 DIAGNOSIS — J449 Chronic obstructive pulmonary disease, unspecified: Secondary | ICD-10-CM | POA: Diagnosis present

## 2015-01-14 DIAGNOSIS — Y848 Other medical procedures as the cause of abnormal reaction of the patient, or of later complication, without mention of misadventure at the time of the procedure: Secondary | ICD-10-CM

## 2015-01-14 DIAGNOSIS — R64 Cachexia: Secondary | ICD-10-CM | POA: Diagnosis not present

## 2015-01-14 DIAGNOSIS — I472 Ventricular tachycardia: Secondary | ICD-10-CM | POA: Diagnosis not present

## 2015-01-14 DIAGNOSIS — G9349 Other encephalopathy: Secondary | ICD-10-CM | POA: Diagnosis not present

## 2015-01-14 DIAGNOSIS — G8929 Other chronic pain: Secondary | ICD-10-CM | POA: Diagnosis present

## 2015-01-14 DIAGNOSIS — T80211A Bloodstream infection due to central venous catheter, initial encounter: Secondary | ICD-10-CM | POA: Diagnosis present

## 2015-01-14 DIAGNOSIS — J45909 Unspecified asthma, uncomplicated: Secondary | ICD-10-CM | POA: Diagnosis present

## 2015-01-14 DIAGNOSIS — Z9911 Dependence on respirator [ventilator] status: Secondary | ICD-10-CM

## 2015-01-14 DIAGNOSIS — Z452 Encounter for adjustment and management of vascular access device: Secondary | ICD-10-CM

## 2015-01-14 DIAGNOSIS — I469 Cardiac arrest, cause unspecified: Secondary | ICD-10-CM | POA: Diagnosis not present

## 2015-01-14 DIAGNOSIS — A047 Enterocolitis due to Clostridium difficile: Secondary | ICD-10-CM | POA: Diagnosis not present

## 2015-01-14 DIAGNOSIS — Z6828 Body mass index (BMI) 28.0-28.9, adult: Secondary | ICD-10-CM | POA: Diagnosis not present

## 2015-01-14 DIAGNOSIS — D631 Anemia in chronic kidney disease: Secondary | ICD-10-CM | POA: Diagnosis present

## 2015-01-14 DIAGNOSIS — M109 Gout, unspecified: Secondary | ICD-10-CM | POA: Diagnosis present

## 2015-01-14 DIAGNOSIS — E785 Hyperlipidemia, unspecified: Secondary | ICD-10-CM | POA: Diagnosis present

## 2015-01-14 DIAGNOSIS — E11649 Type 2 diabetes mellitus with hypoglycemia without coma: Secondary | ICD-10-CM | POA: Diagnosis not present

## 2015-01-14 DIAGNOSIS — G9341 Metabolic encephalopathy: Secondary | ICD-10-CM | POA: Diagnosis not present

## 2015-01-14 DIAGNOSIS — Z880 Allergy status to penicillin: Secondary | ICD-10-CM | POA: Diagnosis not present

## 2015-01-14 DIAGNOSIS — J9690 Respiratory failure, unspecified, unspecified whether with hypoxia or hypercapnia: Secondary | ICD-10-CM

## 2015-01-14 DIAGNOSIS — J181 Lobar pneumonia, unspecified organism: Secondary | ICD-10-CM

## 2015-01-14 DIAGNOSIS — D72829 Elevated white blood cell count, unspecified: Secondary | ICD-10-CM | POA: Diagnosis not present

## 2015-01-14 DIAGNOSIS — A412 Sepsis due to unspecified staphylococcus: Secondary | ICD-10-CM | POA: Diagnosis present

## 2015-01-14 DIAGNOSIS — J189 Pneumonia, unspecified organism: Secondary | ICD-10-CM | POA: Diagnosis not present

## 2015-01-14 DIAGNOSIS — T827XXA Infection and inflammatory reaction due to other cardiac and vascular devices, implants and grafts, initial encounter: Secondary | ICD-10-CM | POA: Diagnosis present

## 2015-01-14 DIAGNOSIS — R0902 Hypoxemia: Secondary | ICD-10-CM

## 2015-01-14 DIAGNOSIS — I120 Hypertensive chronic kidney disease with stage 5 chronic kidney disease or end stage renal disease: Secondary | ICD-10-CM | POA: Diagnosis present

## 2015-01-14 DIAGNOSIS — G9340 Encephalopathy, unspecified: Secondary | ICD-10-CM

## 2015-01-14 HISTORY — DX: Anemia, unspecified: D64.9

## 2015-01-14 HISTORY — DX: Type 2 diabetes mellitus without complications: E11.9

## 2015-01-14 HISTORY — DX: Dependence on renal dialysis: N18.6

## 2015-01-14 HISTORY — DX: Dependence on renal dialysis: Z99.2

## 2015-01-14 LAB — GLUCOSE, CAPILLARY: Glucose-Capillary: 97 mg/dL (ref 70–99)

## 2015-01-14 LAB — CBC
HCT: 30.1 % — ABNORMAL LOW (ref 40.0–52.0)
Hemoglobin: 9.2 g/dL — ABNORMAL LOW (ref 13.0–18.0)
MCH: 27 pg (ref 26.0–34.0)
MCHC: 30.5 g/dL — ABNORMAL LOW (ref 32.0–36.0)
MCV: 88.3 fL (ref 80.0–100.0)
Platelets: 271 10*3/uL (ref 150–440)
RBC: 3.41 MIL/uL — ABNORMAL LOW (ref 4.40–5.90)
RDW: 19.7 % — AB (ref 11.5–14.5)
WBC: 7.1 10*3/uL (ref 3.8–10.6)

## 2015-01-14 LAB — CREATININE, SERUM
Creatinine, Ser: 9.08 mg/dL — ABNORMAL HIGH (ref 0.61–1.24)
GFR calc Af Amer: 7 mL/min — ABNORMAL LOW (ref >60)
GFR calc non Af Amer: 6 mL/min — ABNORMAL LOW (ref >60)

## 2015-01-14 MED ORDER — PANTOPRAZOLE SODIUM 40 MG PO TBEC
40.0000 mg | DELAYED_RELEASE_TABLET | Freq: Every day | ORAL | Status: DC
  Administered 2015-01-15 – 2015-01-20 (×4): 40 mg via ORAL
  Filled 2015-01-14 (×5): qty 1

## 2015-01-14 MED ORDER — UMECLIDINIUM BROMIDE 62.5 MCG/INH IN AEPB: 1.0000 | INHALATION_SPRAY | Freq: Every day | RESPIRATORY_TRACT | Status: DC

## 2015-01-14 MED ORDER — OXYCODONE HCL 5 MG PO TABS
5.0000 mg | ORAL_TABLET | Freq: Two times a day (BID) | ORAL | Status: DC | PRN
  Administered 2015-01-15 – 2015-01-21 (×5): 5 mg via ORAL
  Filled 2015-01-14 (×5): qty 1

## 2015-01-14 MED ORDER — FENTANYL 50 MCG/HR TD PT72
50.0000 ug | MEDICATED_PATCH | TRANSDERMAL | Status: DC
  Administered 2015-01-14: 50 ug via TRANSDERMAL
  Filled 2015-01-14: qty 1

## 2015-01-14 MED ORDER — DOCUSATE SODIUM 100 MG PO CAPS
100.0000 mg | ORAL_CAPSULE | Freq: Two times a day (BID) | ORAL | Status: DC
  Administered 2015-01-14 – 2015-01-20 (×5): 100 mg via ORAL
  Filled 2015-01-14 (×6): qty 1

## 2015-01-14 MED ORDER — OXYBUTYNIN CHLORIDE 5 MG PO TABS
5.0000 mg | ORAL_TABLET | Freq: Two times a day (BID) | ORAL | Status: DC
  Administered 2015-01-14 – 2015-01-22 (×12): 5 mg via ORAL
  Filled 2015-01-14 (×12): qty 1

## 2015-01-14 MED ORDER — INSULIN ASPART 100 UNIT/ML ~~LOC~~ SOLN: 2.0000 [IU] | Freq: Three times a day (TID) | SUBCUTANEOUS | Status: DC

## 2015-01-14 MED ORDER — HEPARIN SODIUM (PORCINE) 5000 UNIT/ML IJ SOLN
5000.0000 [IU] | Freq: Three times a day (TID) | INTRAMUSCULAR | Status: DC
  Administered 2015-01-14 – 2015-01-15 (×2): 5000 [IU] via SUBCUTANEOUS
  Filled 2015-01-14 (×2): qty 1

## 2015-01-14 MED ORDER — MONTELUKAST SODIUM 10 MG PO TABS
10.0000 mg | ORAL_TABLET | Freq: Every day | ORAL | Status: DC
  Administered 2015-01-14 – 2015-01-24 (×8): 10 mg via ORAL
  Filled 2015-01-14 (×8): qty 1

## 2015-01-14 MED ORDER — DULOXETINE HCL 60 MG PO CPEP
60.0000 mg | ORAL_CAPSULE | Freq: Every day | ORAL | Status: DC
  Administered 2015-01-15 – 2015-01-24 (×7): 60 mg via ORAL
  Filled 2015-01-14 (×7): qty 1

## 2015-01-14 MED ORDER — CALCIUM ACETATE (PHOS BINDER) 667 MG/5ML PO SOLN
1334.0000 mg | Freq: Three times a day (TID) | ORAL | Status: DC
  Administered 2015-01-15 – 2015-01-22 (×13): 1334 mg via ORAL
  Filled 2015-01-14 (×32): qty 10

## 2015-01-14 MED ORDER — INSULIN DETEMIR 100 UNIT/ML ~~LOC~~ SOLN
18.0000 [IU] | Freq: Every day | SUBCUTANEOUS | Status: DC
  Administered 2015-01-14: 18 [IU] via SUBCUTANEOUS
  Filled 2015-01-14 (×2): qty 0.18

## 2015-01-14 MED ORDER — SODIUM CHLORIDE 0.9 % IV SOLN
2000.0000 mg | Freq: Once | INTRAVENOUS | Status: AC
  Administered 2015-01-14: 2000 mg via INTRAVENOUS
  Filled 2015-01-14: qty 2000

## 2015-01-14 MED ORDER — ONDANSETRON HCL 4 MG PO TABS: 4.0000 mg | ORAL_TABLET | Freq: Four times a day (QID) | ORAL | Status: DC | PRN

## 2015-01-14 MED ORDER — FLUTICASONE PROPIONATE 50 MCG/ACT NA SUSP
1.0000 | Freq: Every day | NASAL | Status: DC
  Administered 2015-01-14 – 2015-01-23 (×7): 1 via NASAL
  Filled 2015-01-14: qty 16

## 2015-01-14 MED ORDER — ACETAMINOPHEN 325 MG PO TABS
650.0000 mg | ORAL_TABLET | Freq: Four times a day (QID) | ORAL | Status: DC | PRN
  Administered 2015-01-23 – 2015-01-24 (×3): 650 mg via ORAL
  Filled 2015-01-14 (×4): qty 2

## 2015-01-14 MED ORDER — SENNOSIDES-DOCUSATE SODIUM 8.6-50 MG PO TABS: 1.0000 | ORAL_TABLET | Freq: Every evening | ORAL | Status: DC | PRN

## 2015-01-14 MED ORDER — GLIPIZIDE 10 MG PO TABS: 10.0000 mg | ORAL_TABLET | Freq: Two times a day (BID) | ORAL | Status: DC

## 2015-01-14 MED ORDER — LACTULOSE 10 GM/15ML PO SOLN
10.0000 g | Freq: Three times a day (TID) | ORAL | Status: DC
  Administered 2015-01-14 – 2015-01-16 (×5): 10 g via ORAL
  Administered 2015-01-19: 20 g via ORAL
  Administered 2015-01-19 (×2): 10 g via ORAL
  Administered 2015-01-20: 20 g via ORAL
  Filled 2015-01-14 (×9): qty 30

## 2015-01-14 MED ORDER — HYDROXYZINE HCL 50 MG PO TABS: 25.0000 mg | ORAL_TABLET | Freq: Four times a day (QID) | ORAL | Status: DC | PRN

## 2015-01-14 MED ORDER — FAMOTIDINE 20 MG PO TABS
20.0000 mg | ORAL_TABLET | Freq: Two times a day (BID) | ORAL | Status: DC
  Administered 2015-01-14 – 2015-01-15 (×2): 20 mg via ORAL
  Filled 2015-01-14 (×2): qty 1

## 2015-01-14 MED ORDER — HYDRALAZINE HCL 25 MG PO TABS
25.0000 mg | ORAL_TABLET | Freq: Four times a day (QID) | ORAL | Status: DC
  Administered 2015-01-14: 25 mg via ORAL
  Filled 2015-01-14 (×2): qty 1

## 2015-01-14 MED ORDER — WARFARIN SODIUM 2 MG PO TABS
2.0000 mg | ORAL_TABLET | Freq: Every day | ORAL | Status: DC
  Administered 2015-01-15 – 2015-01-16 (×2): 2 mg via ORAL
  Filled 2015-01-14 (×2): qty 1

## 2015-01-14 MED ORDER — METOPROLOL TARTRATE 50 MG PO TABS
50.0000 mg | ORAL_TABLET | Freq: Two times a day (BID) | ORAL | Status: DC
  Administered 2015-01-14: 50 mg via ORAL
  Filled 2015-01-14 (×2): qty 1

## 2015-01-14 MED ORDER — ACETAMINOPHEN 650 MG RE SUPP: 650.0000 mg | Freq: Four times a day (QID) | RECTAL | Status: DC | PRN

## 2015-01-14 MED ORDER — GABAPENTIN 300 MG PO CAPS
300.0000 mg | ORAL_CAPSULE | Freq: Four times a day (QID) | ORAL | Status: DC
  Administered 2015-01-14 – 2015-01-20 (×13): 300 mg via ORAL
  Filled 2015-01-14 (×14): qty 1

## 2015-01-14 MED ORDER — RENA-VITE PO TABS
1.0000 | ORAL_TABLET | Freq: Every day | ORAL | Status: DC
  Administered 2015-01-15 – 2015-01-16 (×2): 1 via ORAL
  Administered 2015-01-20: 09:00:00 via ORAL
  Administered 2015-01-21 – 2015-01-24 (×4): 1 via ORAL
  Filled 2015-01-14 (×11): qty 1

## 2015-01-14 MED ORDER — ONDANSETRON HCL 4 MG/2ML IJ SOLN: 4.0000 mg | Freq: Four times a day (QID) | INTRAMUSCULAR | Status: DC | PRN

## 2015-01-14 MED ORDER — LEVOTHYROXINE SODIUM 125 MCG PO TABS
125.0000 ug | ORAL_TABLET | Freq: Every day | ORAL | Status: DC
  Administered 2015-01-15 – 2015-01-24 (×7): 125 ug via ORAL
  Filled 2015-01-14 (×8): qty 1

## 2015-01-14 MED ORDER — ALUM & MAG HYDROXIDE-SIMETH 200-200-20 MG/5ML PO SUSP: 30.0000 mL | Freq: Four times a day (QID) | ORAL | Status: DC | PRN

## 2015-01-14 MED ORDER — EZETIMIBE 10 MG PO TABS
10.0000 mg | ORAL_TABLET | Freq: Every day | ORAL | Status: DC
  Administered 2015-01-15 – 2015-01-22 (×6): 10 mg via ORAL
  Filled 2015-01-14 (×6): qty 1

## 2015-01-14 MED ORDER — DOXEPIN HCL 50 MG PO CAPS
50.0000 mg | ORAL_CAPSULE | Freq: Every day | ORAL | Status: DC
  Administered 2015-01-14 – 2015-01-16 (×3): 50 mg via ORAL
  Filled 2015-01-14 (×3): qty 1

## 2015-01-14 MED ORDER — POLYETHYLENE GLYCOL 3350 17 G PO PACK
17.0000 g | PACK | Freq: Every day | ORAL | Status: DC
  Administered 2015-01-16 – 2015-01-20 (×2): 17 g via ORAL
  Filled 2015-01-14 (×2): qty 1

## 2015-01-14 MED ORDER — INSULIN ASPART 100 UNIT/ML ~~LOC~~ SOLN: 0.0000 [IU] | Freq: Three times a day (TID) | SUBCUTANEOUS | Status: DC

## 2015-01-14 NOTE — H&P (Signed)
PATIENT NAMECARLOSDANIEL, [name] MR#:  [id number] DATE OF BIRTH:  [birth year]  DATE OF ADMISSION:  04-Feb-2015  PRIMARY CARE PHYSICIAN: Mosetta Pigeon, M.D.    CHIEF COMPLAINT: Dialysis site infection.   HISTORY OF PRESENT ILLNESS: The patient is a 59 year old male with a known history of end-stage renal disease on hemodialysis who is admitted for HD Perm cath infection. He is directly admitted from dialysis. He did not complete his dialysis today due to the purulent drainage.   REVIEW OF SYSTEMS:    CONSTITUTIONAL: No fever. No fatigue, weakness, pain, or weight loss.  EYES: No blurring or double vision, discharge, or redness.  EARS, NOSE, THROAT: No tinnitus, ear pain, or hearing loss.  RESPIRATORY: No cough, No shortness of breath.  CARDIOVASCULAR: No chest pain, orthopnea, edema, arrhythmia, palpitation.  GASTROINTESTINAL: No nausea, vomiting, diarrhea, abdominal pain.  GENITOURINARY: No dysuria, hematuria, increased frequency.  ENDOCRINE: No heat or cold intolerance. No excessive sweating.  SKIN: Dialysis site infection, pus around insertion site of permacath. MUSCULOSKELETAL: No pain or swelling in the joints.  NEUROLOGICAL: No numbness, weakness, tremor, or vertigo.  PSYCHIATRIC: No anxiety, insomnia, bipolar disorder.   PAST MEDICAL HISTORY:  1. Hypertension.  2. End-stage renal disease on hemodialysis.  3. Diabetic neuropathy.  4. History of hyperlipidemia.   PAST SURGICAL HISTORY: AV fistula access in left arm.   FAMILY HISTORY: Negative for hypertension or diabetes.   SOCIAL HISTORY: He does not smoke. He quit drinking in 1979. Does not do drugs and he is on hemodialysis. Independent in day-to-day activities.   MEDICATIONS AT HOME:  1.  Acetaminophen/hydrocodone 325/5 mg 1 tablet p.o. every 6 hours as needed.  2.  Aldactone 25 mg p.o. daily.  3.  Bayer aspirin 325 mg p.o. daily.  4.  Coreg 3.125 mg p.o. daily.  5.  Cyclobenzaprine 10 mg p.o. every 8 hours as needed.  6.   Flonase 2 sprays intranasally daily.  7.  Insulin 70/30 10 units subcutaneous twice a day.  8.  Imdur 30 mg p.o. daily. 9.  Ketoconazole topical 2% to affected area once daily as needed. 10.  Ketorolac 10 mg p.o. every 8 hours as needed.  11.  Lidocaine 2% every 4 hours as needed.  12.  Lisinopril 2.5 mg p.o. daily.  13.  Mucinex D 1 tablet p.o. at bedtime.  14.  Omeprazole 20 mg p.o. daily.  15.  Oxycodone 10 mg p.o. every 6 hours as needed.  16.  Oxycodone 20 mg p.o. b.i.d.  17.  Renal Caps once daily. 18.  Sensipar 30 mg p.o. daily.   PHYSICAL EXAMINATION:  VITAL SIGNS: Temperature 98.4, heart rate 96 per minute, respirations 18 per minute, blood pressure 126/74. He is saturating 100% on room air.  GENERAL:  The patient is a 59 year old male lying in the bed comfortably without any acute distress.  EYES: Pupils equal, reactive to light and accommodation. No scleral icterus. Extraocular muscles intact.  HEENT: Head atraumatic, normocephalic. Oropharynx and nasopharynx clear.  NECK: Supple. No jugular venous distention. No thyroid enlargement.  LUNGS: Clear to auscultation bilaterally. No wheezing, rales, or crepitation.  CARDIOVASCULAR: S1, S2 normal. No murmurs or rubs. ABDOMEN: Soft, nontender, nondistended. Bowel sounds present. No organomegaly or mass.  EXTREMITIES: No pedal edema, cyanosis, or clubbing.  NEUROLOGIC: Nonfocal examination. Cranial nerves II through XII intact.  Muscle strength 5/5 in all extremities. Sensation intact.  PSYCHIATRIC: The patient is alert and oriented x 3. SKIN:  He has right PermCath site dressed  up with significant pustular area at the insertion site itself.  His left arm reveals taht he has had AV grafts int he past but not functioning. MUSCULOSKELETAL: No joint effusion or tenderness.   LABORATORY PANEL: None.   IMPRESSION AND PLAN:  1.  Dialysis access site infection. Patient wll now need to have [facility]. Dr Wyn Quakerew and Dr. Thedore MinsSingh  have been consulted I have started Vancomycin and blood cultures have been ordered.  2.  End-stage renal disease on hemodialysis.  Will consult nephrology for hemodialysis need.  3.  Diabetes. Will continue his home regimen of 70/30 and add sliding scale insulin and ADA diet.  4.  Diabetic neuropathy and chronic pain. Continue outpatient medications 5.  Essential Hypertension. Will continue home medication and adjust as needed.   CODE STATUS: Full code.   Total time taking care of this patient is 45 minutes.    ____________________________

## 2015-01-14 NOTE — Op Note (Signed)
Preoperative diagnosis: Infected tunneled catheter, end-stage renal disease.  Postoperative diagnosis: Same  Procedure performed: Removal of infected tunneled catheter right thigh.  Procedure performed by Levora DredgeGregory Schnier M.D.  Anesthesia: local    Indications: The patient presents to the hospital with purulent drainage around his tunneled catheter.  Procedure: The patient is positioned supine in his bed after the risks and benefits as well as the indications were reviewed. All questions have been answered. He wishes for us to proceed with catheter removal.  His right thigh is prepped and draped in a sterile fashion. 1% lidocaine is infiltrated into the soft tissues surrounding the catheter and the palpable cuff. The hemostat is used to lyse the adhesions to the cuff and subsequently the catheter is removed. The tip was transected and placed in a sterile cup and sent for culture. Pressure was held over the tunnel site for 5 minutes.  Antibiotic ointment and a sterile dressing is applied.  Patient tolerated procedure well and there were no changes in his status.

## 2015-01-14 NOTE — Progress Notes (Signed)
ANTIBIOTIC CONSULT NOTE - INITIAL  Pharmacy Consult for vancomycin Indication: Infected HD catheter  Allergies  Allergen Reactions  . Penicillins Hives    Patient Measurements: Height: 6\' 1"  (185.4 cm) Weight: 186 lb (84.369 kg) IBW/kg (Calculated) : 79.9  Vital Signs: Temp: 98 F (36.7 C) (05/03 1150) Temp Source: Oral (05/03 1150) BP: 103/59 mmHg (05/03 1150) Pulse Rate: 87 (05/03 1150) Intake/Output from previous day:   Intake/Output from this shift:    Labs: No results for input(s): WBC, HGB, PLT, LABCREA, CREATININE in the last 72 hours. Estimated Creatinine Clearance: 14.2 mL/min (by C-G formula based on Cr of 6.5). No results for input(s): VANCOTROUGH, VANCOPEAK, VANCORANDOM, GENTTROUGH, GENTPEAK, GENTRANDOM, TOBRATROUGH, TOBRAPEAK, TOBRARND, AMIKACINPEAK, AMIKACINTROU, AMIKACIN in the last 72 hours.   Microbiology: Recent Results (from the past 720 hour(s))  Misc Aer/Anaerobic Cult     Status: None   Collection Time: 12/19/14  1:18 PM  Result Value Ref Range Status   Micro Text Report   Final       SOURCE: DIALYSIS CATH TIP    ORGANISM 1                RARE COAGULASE NEGATIVE STAPHYLOCOCCUS   COMMENT                   CALL LAB IF SENSITIVITY TESTING REQUIRED   COMMENT                   NO ANAEROBES ISOLATED IN 4 DAYS   GRAM STAIN                GROSSLY BLOODY   GRAM STAIN                FEW WHITE BLOOD CELLS   GRAM STAIN                NO ORGANISMS SEEN   ANTIBIOTIC                                                      Culture, blood (single)     Status: None   Collection Time: 12/21/14 10:27 AM  Result Value Ref Range Status   Micro Text Report   Final       SOURCE: #1 RIGHT ARM    COMMENT                   NO GROWTH AEROBICALLY/ANAEROBICALLY IN 5 DAYS   ANTIBIOTIC                                                      Culture, blood (single)     Status: None   Collection Time: 12/21/14 11:21 AM  Result Value Ref Range Status   Micro Text Report    Final       COMMENT                   NO GROWTH AEROBICALLY/ANAEROBICALLY IN 5 DAYS   ANTIBIOTIC  Medical History: No past medical history on file.  Medications:  Anti-infectives    Start     Dose/Rate Route Frequency Ordered Stop   01/18/2015 1300  vancomycin (VANCOCIN) 2,000 mg in sodium chloride 0.9 % 500 mL IVPB     2,000 mg 250 mL/hr over 120 Minutes Intravenous  Once 02/08/2015 1249       Assessment: Vancomycin for infected HD catheter with no plans for HD currently.   Goal of Therapy:  Vancomycin trough level 15-20 mcg/ml  Plan:  Measure antibiotic drug levels at steady state Follow up culture results Vancomycin 2000 mg iv once then 750 mg iv qHD. Will check a level prior to the 3rd HD session. Need to order scheduled vancomycin 750 mg iv qHD and vanomycin trough once HD schedule determined.   Luisa Hart D 02/04/2015,12:51 PM

## 2015-01-14 NOTE — Consult Note (Signed)
PATIENT NAMLuvenia [name]: Picinich, [name] MR#: [id number] DATE OF BIRTH: Jan 04, 1956  DATE OF ADMISSION: 02/07/2015  PRIMARY CARE PHYSICIAN: Mosetta PigeonHarmeet Singh, M.D.    CHIEF COMPLAINT: Dialysis site infection.   HISTORY OF PRESENT ILLNESS: The patient is a 59 year old male with a known history of end-stage renal disease on hemodialysis who is admitted for HD Perm cath infection. He is directly admitted from dialysis. He did not complete his dialysis today due to the purulent drainage. The  Catheter was recently placed  after an admission for infected dialysis catheter  REVIEW OF SYSTEMS:  CONSTITUTIONAL: No fever. No fatigue, weakness, pain, or weight loss.  EYES: No blurring or double vision, discharge, or redness.  EARS, NOSE, THROAT: No tinnitus, ear pain, or hearing loss.  RESPIRATORY: No cough, No shortness of breath.  CARDIOVASCULAR: No chest pain, orthopnea, edema, arrhythmia, palpitation.  GASTROINTESTINAL: No nausea, vomiting, diarrhea, abdominal pain.  GENITOURINARY: No dysuria, hematuria, increased frequency.  ENDOCRINE: No heat or cold intolerance. No excessive sweating.  SKIN: Dialysis site infection, pus around insertion site of permacath. MUSCULOSKELETAL: No pain or swelling in the joints.  NEUROLOGICAL: No numbness, weakness, tremor, or vertigo.  PSYCHIATRIC: No anxiety, insomnia, bipolar disorder.   PAST MEDICAL HISTORY:  1.Hypertension.  2.End-stage renal disease on hemodialysis.  3.Diabetic neuropathy.  4.History of hyperlipidemia.   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   SOCIAL HISTORY: He does not smoke. He quit drinking in 1979. Does not do drugs and he is on hemodialysis. Independent in day-to-day activities.   MEDICATIONS AT HOME:  1. Acetaminophen/hydrocodone 325/5 mg 1 tablet p.o. every 6 hours as needed.  2. Aldactone 25 mg p.o. daily.  3. Bayer aspirin 325  mg p.o. daily.  4. Coreg 3.125 mg p.o. daily.  5. Cyclobenzaprine 10 mg p.o. every 8 hours as needed.  6. Flonase 2 sprays intranasally daily.  7. Insulin 70/30 10 units subcutaneous twice a day.  8. Imdur 30 mg p.o. daily. 9. Ketoconazole topical 2% to affected area once daily as needed. 10. Ketorolac 10 mg p.o. every 8 hours as needed.  11. Lidocaine 2% every 4 hours as needed.  12. Lisinopril 2.5 mg p.o. daily.  13. Mucinex D 1 tablet p.o. at bedtime.  14. Omeprazole 20 mg p.o. daily.  15. Oxycodone 10 mg p.o. every 6 hours as needed.  16. Oxycodone 20 mg p.o. b.i.d.  17. Renal Caps once daily. 18. Sensipar 30 mg p.o. daily.   PHYSICAL EXAMINATION:  VITAL SIGNS: Temperature 98.4, heart rate 96 per minute, respirations 18 per minute, blood pressure 126/74. He is saturating 100% on room air.  GENERAL: The patient is a 59 year old male lying in the bed comfortably without any acute distress.  EYES: Pupils equal, reactive to light and accommodation. No scleral icterus. Extraocular muscles intact.  HEENT: Head atraumatic, normocephalic. Oropharynx and nasopharynx clear.  NECK: Supple. No jugular venous distention. No thyroid enlargement.  LUNGS: Clear to auscultation bilaterally. No wheezing, rales, or crepitation.  CARDIOVASCULAR: S1, S2 normal. No murmurs or rubs. ABDOMEN: Soft, nontender, nondistended. Bowel sounds present. No organomegaly or mass.  EXTREMITIES: No pedal edema, cyanosis, or clubbing.  NEUROLOGIC: Nonfocal examination. Cranial nerves II through XII intact. Muscle strength 5/5 in all extremities. Sensation intact.  PSYCHIATRIC: The patient is alert and oriented x 3. SKIN: He has right PermCath site dressed up with significant pustular area at the insertion site itself. His left arm reveals taht he has had  AV grafts int he past but not functioning. MUSCULOSKELETAL: No joint effusion or tenderness.   LABORATORY PANEL: None.    IMPRESSION AND PLAN:  1. Dialysis access site infection. Patient wll now need to have this [facility] removed. I will do this at the bedside tonight.  He is started on Vancomycin and blood cultures have been ordered.  ID consult will be obtained and the planning for placement of a new tunneled access will be pending follow up blood cultures. 2. End-stage renal disease on hemodialysis. Will consult nephrology for hemodialysis need.  3. Diabetes. Will continue his home regimen of 70/30 and add sliding scale insulin and ADA diet.  4. Diabetic neuropathy and chronic pain. Continue outpatient medications 5. Essential Hypertension. Will continue home medication and adjust as needed.   CODE STATUS: Full code.   Total time taking care of this patient is 45 minutes.    ____________________________

## 2015-01-15 ENCOUNTER — Encounter: Payer: Self-pay | Admitting: *Deleted

## 2015-01-15 LAB — GLUCOSE, CAPILLARY
GLUCOSE-CAPILLARY: 44 mg/dL — AB (ref 70–99)
GLUCOSE-CAPILLARY: 49 mg/dL — AB (ref 70–99)
GLUCOSE-CAPILLARY: 80 mg/dL (ref 70–99)
GLUCOSE-CAPILLARY: 69 mg/dL — AB (ref 70–99)
Glucose-Capillary: 122 mg/dL — ABNORMAL HIGH (ref 70–99)
Glucose-Capillary: 32 mg/dL — CL (ref 70–99)
Glucose-Capillary: 42 mg/dL — CL (ref 70–99)
Glucose-Capillary: 48 mg/dL — ABNORMAL LOW (ref 70–99)
Glucose-Capillary: 56 mg/dL — ABNORMAL LOW (ref 70–99)
Glucose-Capillary: 61 mg/dL — ABNORMAL LOW (ref 70–99)
Glucose-Capillary: 79 mg/dL (ref 70–99)

## 2015-01-15 LAB — CBC
HCT: 26.7 % — ABNORMAL LOW (ref 40.0–52.0)
HEMOGLOBIN: 8.3 g/dL — AB (ref 13.0–18.0)
MCH: 27.4 pg (ref 26.0–34.0)
MCHC: 31.1 g/dL — AB (ref 32.0–36.0)
MCV: 88.2 fL (ref 80.0–100.0)
Platelets: 195 10*3/uL (ref 150–440)
RBC: 3.03 MIL/uL — ABNORMAL LOW (ref 4.40–5.90)
RDW: 19.4 % — ABNORMAL HIGH (ref 11.5–14.5)
WBC: 8.9 10*3/uL (ref 3.8–10.6)

## 2015-01-15 LAB — BASIC METABOLIC PANEL
Anion gap: 13 (ref 5–15)
BUN: 45 mg/dL — ABNORMAL HIGH (ref 6–20)
CALCIUM: 8.3 mg/dL — AB (ref 8.9–10.3)
CO2: 23 mmol/L (ref 22–32)
CREATININE: 10.14 mg/dL — AB (ref 0.61–1.24)
Chloride: 102 mmol/L (ref 101–111)
GFR calc Af Amer: 6 mL/min — ABNORMAL LOW (ref >60)
GFR calc non Af Amer: 5 mL/min — ABNORMAL LOW (ref >60)
GLUCOSE: 54 mg/dL — AB (ref 65–99)
Potassium: 4.2 mmol/L (ref 3.5–5.1)
SODIUM: 138 mmol/L (ref 135–145)

## 2015-01-15 LAB — PROTIME-INR
INR: 1.57
Prothrombin Time: 19 seconds — ABNORMAL HIGH (ref 11.4–15.0)

## 2015-01-15 LAB — CLOSTRIDIUM DIFFICILE BY PCR: Toxigenic C. Difficile by PCR: POSITIVE — AB

## 2015-01-15 LAB — C DIFFICILE QUICK SCREEN W PCR REFLEX
C DIFFICILE (CDIFF) TOXIN: NEGATIVE
C Diff antigen: POSITIVE

## 2015-01-15 MED ORDER — DEXTROSE 50 % IV SOLN
INTRAVENOUS | Status: AC
  Administered 2015-01-15: 16:00:00 25 mL via INTRAVENOUS
  Filled 2015-01-15: qty 50

## 2015-01-15 MED ORDER — METRONIDAZOLE 500 MG PO TABS
500.0000 mg | ORAL_TABLET | Freq: Three times a day (TID) | ORAL | Status: DC
  Administered 2015-01-15 – 2015-01-16 (×5): 500 mg via ORAL
  Filled 2015-01-15 (×6): qty 1

## 2015-01-15 MED ORDER — FAMOTIDINE 20 MG PO TABS
10.0000 mg | ORAL_TABLET | Freq: Two times a day (BID) | ORAL | Status: DC
  Administered 2015-01-15 – 2015-01-16 (×2): 10 mg via ORAL
  Administered 2015-01-16: 11:00:00 via ORAL
  Administered 2015-01-19: 20 mg via ORAL
  Administered 2015-01-19: 10 mg via ORAL
  Administered 2015-01-20: 20 mg via ORAL
  Administered 2015-01-21 (×2): 10 mg via ORAL
  Filled 2015-01-15 (×7): qty 1
  Filled 2015-01-15: qty 0.5
  Filled 2015-01-15: qty 1

## 2015-01-15 MED ORDER — DEXTROSE 50 % IV SOLN
25.0000 mL | Freq: Once | INTRAVENOUS | Status: AC
  Administered 2015-01-15: 25 mL via INTRAVENOUS

## 2015-01-15 MED ORDER — DEXTROSE 10 % IV SOLN
INTRAVENOUS | Status: DC
  Administered 2015-01-15 – 2015-01-16 (×2): via INTRAVENOUS

## 2015-01-15 MED ORDER — TIOTROPIUM BROMIDE MONOHYDRATE 18 MCG IN CAPS
18.0000 ug | ORAL_CAPSULE | Freq: Every day | RESPIRATORY_TRACT | Status: DC
  Administered 2015-01-15 – 2015-01-23 (×5): 18 ug via RESPIRATORY_TRACT
  Filled 2015-01-15: qty 5

## 2015-01-15 MED ORDER — NEPRO/CARBSTEADY PO LIQD
237.0000 mL | Freq: Two times a day (BID) | ORAL | Status: DC
  Administered 2015-01-16 (×2): 237 mL via ORAL

## 2015-01-15 MED ORDER — WARFARIN - PHYSICIAN DOSING INPATIENT
Freq: Every day | Status: DC
  Administered 2015-01-15 – 2015-01-16 (×2)

## 2015-01-15 NOTE — Progress Notes (Signed)
[facility]  ROUNDING NOTE   Subjective:   Admitted yesterday for purulent drainage from tunnelled HD catheter. Catheter removed yesterday by Dr. Gilda CreaseSchnier. Now without hemodialysis access.  Patient received only 1 hour of treatment yesterday.   Objective:  Vital signs in last 24 hours:  Temp:  [97.5 F (36.4 C)-98.9 F (37.2 C)] 97.5 F (36.4 C) (05/04 0553) Pulse Rate:  [68-87] 69 (05/04 0553) Resp:  [18] 18 (05/04 0553) BP: (98-157)/(57-78) 98/57 mmHg (05/04 0932) SpO2:  [96 %-100 %] 100 % (05/04 0553) Weight:  [84.369 kg (186 lb)] 84.369 kg (186 lb) (05/03 1236)  Weight change:  Filed Weights   01/23/2015 1236  Weight: 84.369 kg (186 lb)    Intake/Output: I/O last 3 completed shifts: In: -  Out: 200 [Urine:200]   Intake/Output this shift:  Total I/O In: 240 [P.O.:240] Out: -   Physical Exam: General: NAD,   Head: Normocephalic, atraumatic. Moist oral mucosal membranes  Eyes: Anicteric, PERRL  Neck: Supple, trachea midline  Lungs:  Clear to auscultation  Heart: Regular rate and rhythm  Abdomen:  Soft, nontender,   Extremities:  no peripheral edema.  Neurologic: Nonfocal, moving all four extremities  Skin: No lesions  Access: none    Basic Metabolic Panel:  Recent Labs Lab 02/02/2015 1257 01/15/15 0548  NA  --  138  K  --  4.2  CL  --  102  CO2  --  23  GLUCOSE  --  54*  BUN  --  45*  CREATININE 9.08* 10.14*  CALCIUM  --  8.3*    Liver Function Tests: No results for input(s): AST, ALT, ALKPHOS, BILITOT, PROT, ALBUMIN in the last 168 hours. No results for input(s): LIPASE, AMYLASE in the last 168 hours. No results for input(s): AMMONIA in the last 168 hours.  CBC:  Recent Labs Lab 01/13/2015 1257 01/15/15 0548  WBC 7.1 8.9  HGB 9.2* 8.3*  HCT 30.1* 26.7*  MCV 88.3 88.2  PLT 271 195    Cardiac Enzymes: No results for input(s): CKTOTAL, CKMB, CKMBINDEX, TROPONINI in the last 168 hours.  BNP: Invalid input(s):  POCBNP  CBG:  Recent Labs Lab 01/15/15 0020 01/15/15 0725 01/15/15 0816 01/15/15 0848 01/15/15 0935  GLUCAP 122* 44* 42* 49* 61*    Microbiology: Results for orders placed or performed during the hospital encounter of 12/19/14  Misc Aer/Anaerobic Cult     Status: None   Collection Time: 12/19/14  1:18 PM  Result Value Ref Range Status   Micro Text Report   Final       SOURCE: DIALYSIS CATH TIP    ORGANISM 1                RARE COAGULASE NEGATIVE STAPHYLOCOCCUS   COMMENT                   CALL LAB IF SENSITIVITY TESTING REQUIRED   COMMENT                   NO ANAEROBES ISOLATED IN 4 DAYS   GRAM STAIN                GROSSLY BLOODY   GRAM STAIN                FEW WHITE BLOOD CELLS   GRAM STAIN                NO ORGANISMS SEEN   ANTIBIOTIC  Culture, blood (single)     Status: None   Collection Time: 12/21/14 10:27 AM  Result Value Ref Range Status   Micro Text Report   Final       SOURCE: #1 RIGHT ARM    COMMENT                   NO GROWTH AEROBICALLY/ANAEROBICALLY IN 5 DAYS   ANTIBIOTIC                                                      Culture, blood (single)     Status: None   Collection Time: 12/21/14 11:21 AM  Result Value Ref Range Status   Micro Text Report   Final       COMMENT                   NO GROWTH AEROBICALLY/ANAEROBICALLY IN 5 DAYS   ANTIBIOTIC                                                        Coagulation Studies:  Recent Labs  01/25/2015 2335  LABPROT 19.0*  INR 1.57    Urinalysis: No results for input(s): COLORURINE, LABSPEC, PHURINE, GLUCOSEU, HGBUR, BILIRUBINUR, KETONESUR, PROTEINUR, UROBILINOGEN, NITRITE, LEUKOCYTESUR in the last 72 hours.  Invalid input(s): APPERANCEUR    Imaging: No results found.   Medications:     . calcium acetate (Phos Binder)  1,334 mg Oral TID WC  . docusate sodium  100 mg Oral BID  . doxepin  50 mg Oral QHS  . DULoxetine  60 mg Oral  Daily  . ezetimibe  10 mg Oral Daily  . famotidine  20 mg Oral BID  . fentaNYL  50 mcg Transdermal Q72H  . fluticasone  1 spray Each Nare Daily  . gabapentin  300 mg Oral QID  . glipiZIDE  10 mg Oral BID AC  . heparin  5,000 Units Subcutaneous 3 times per day  . hydrALAZINE  25 mg Oral QID  . insulin aspart  0-9 Units Subcutaneous TID WC  . insulin aspart  2-10 Units Subcutaneous TID AC  . insulin detemir  18 Units Subcutaneous QHS  . lactulose  10 g Oral TID  . levothyroxine  125 mcg Oral QAC breakfast  . metoprolol  50 mg Oral BID  . montelukast  10 mg Oral QHS  . multivitamin  1 tablet Oral Daily  . oxybutynin  5 mg Oral BID  . pantoprazole  40 mg Oral Daily  . polyethylene glycol  17 g Oral Daily  . tiotropium  18 mcg Inhalation Daily  . warfarin  2 mg Oral q1800  . Warfarin - Physician Dosing Inpatient   Does not apply q1800   acetaminophen **OR** acetaminophen, alum & mag hydroxide-simeth, hydrOXYzine, ondansetron **OR** ondansetron (ZOFRAN) IV, oxyCODONE, senna-docusate  Assessment/ Plan:  59 y.o. black male  With asthma, diabetes mellitus type II, diabetic retinopathy, hypertension, gout, hyperlipidemia, and morbid obesity and ESRD   [facility] [address] TTS first shift.   1. End stage renal disease: with infected catheter. Now removed. Last full treatment was Saturday.  Will monitor  daily for dialysis need. No acute indication for dialysis at this time. Monitor serum electrolytes, volume status and uremic symptoms.  Cultures pending   2. Hypertension: well controlled to hypotensive.  - continue metoprolol.   3. Anemia of chronic kidney disease: hemoglobin of 8.3. Last epo on Tuesday 5/3 - monitor CBC  4. Secondary Hyperparathyroidism: PTH elevated as outpatient at 1120, phos at goal at 4.9 -continue calcium acetate liquid for binding.     LOS: 1 Suellyn Meenan 5/4/201610:58 AM

## 2015-01-15 NOTE — Plan of Care (Signed)
Problem: Discharge Progression Outcomes Goal: Other Discharge Outcomes/Goals Pain-pt given prn meds for pain with improvement Hemodynamically-stable Complications-pt complained of hip pain, prn med given with improvement Diet-pt not eating well except for candy at bedside Activity-

## 2015-01-15 NOTE — Progress Notes (Signed)
Initial Nutrition Assessment  INTERVENTION: Medical Nutrition Supplement Therapy: will recommend  Nepro shake BID for added nutrition and protein  NUTRITION DIAGNOSIS:  Inadequate oral intake related to lethargy/confusion as evidenced by estimated needs, meal completion < 25%.  GOAL:  Patient will meet greater than or equal to 90% of their needs  MONITOR:  PO intake, Supplement acceptance, Labs, I & O's, Weight trends  REASON FOR ASSESSMENT:  Malnutrition Screening Tool    ASSESSMENT:  Pt admitted with infected dialysis catheter. Pt with hypoglycemia today. Pt lethargic on visit this afternoon.  PMHx: 1.Hypertension.  2.End-stage renal disease on hemodialysis.  3.Diabetic neuropathy.  4.History of hyperlipidemia.  5.   DM  PO Intake: pt eating Starburst candies in room. Pt unable to tell writer what he ate today. Per RN, Corrie DandyMary very little; documented 25% of breakfast this am. Pt mumbled intake has been poor for 'weeks'  Medications: Calcium acetate, D10 at 50mLhr, coumadin  Labs:  Glucose Profile:  Recent Labs  01/15/15 1110 01/15/15 1427 01/15/15 1625  GLUCAP 48* 32* 80   Electrolyte and Renal Profile:    Recent Labs Lab 02/09/2015 1257 01/15/15 0548  BUN  --  45*  CREATININE 9.08* 10.14*  NA  --  138  K  --  4.2   Protein Profile: No results for input(s): ALBUMIN in the last 168 hours.  Height:  Ht Readings from Last 1 Encounters:  02/08/2015 6\' 1"  (1.854 m)    Weight: Per SCM last weight 188.2lbs 12/25/2014 (1% weight loss in 3 weeks)  Wt Readings from Last 1 Encounters:  01/25/2015 186 lb (84.369 kg)    Wt Readings from Last 10 Encounters:  01/13/2015 186 lb (84.369 kg)    BMI:  Body mass index is 24.55 kg/(m^2).  Estimated Nutritional Needs:  Kcal:  2240-2647kcals, BEE: 1697kcals  Protein:  102-128g protein (1.0-1.2g/kg)  Fluid:  UOP+104000mL  Diet Order:  Diet renal/carb modified with fluid restriction Diet-HS Snack?:  Nothing; Room service appropriate?: Yes; Fluid consistency:: Thin  EDUCATION NEEDS:  Education needs no appropriate at this time   Intake/Output Summary (Last 24 hours) at 01/15/15 1657 Last data filed at 01/15/15 1300  Gross per 24 hour  Intake    240 ml  Output    200 ml  Net     40 ml    Last BM:  5/4 loose large BM  MODERATE Care Level  Leda QuailAllyson Ancel Easler, RD, LDN Pager [phone]

## 2015-01-15 NOTE — Plan of Care (Signed)
Problem: Discharge Progression Outcomes Goal: Other Discharge Outcomes/Goals Outcome: Not Progressing Plan of Care Progress to Goal: Struggled w/pt's blood sugar all day - hypoglycemic.  Followed hypoglycemic protocol.  Amp D50 given and pt is on D10 fluids. Rate inc from 50 to 75. They are on q2hr finger stick checks.  Pt is very lethargic.  Very poor PO intake.  Pt is Cdiff positive and put on enteric precautions. Pt supposed to have dialysis tomorrow but currently doesn't have access.

## 2015-01-15 NOTE — Progress Notes (Signed)
Inpatient Diabetes Program Recommendations  AACE/ADA: New Consensus Statement on Inpatient Glycemic Control (2013)  Target Ranges:  Prepandial:   less than 140 mg/dL      Peak postprandial:   less than 180 mg/dL (1-2 hours)      Critically ill patients:  140 - 180 mg/dL   Reason for Assessment:  Hypoglycemia Results for [name] ReddenBNEY, [name] (MRN [id number]) as of 01/15/2015 12:02  Ref. Range 01/15/2015 07:25 01/15/2015 08:16 01/15/2015 08:48 01/15/2015 09:35 01/15/2015 11:10  Glucose-Capillary Latest Ref Range: 70-99 mg/dL 44 (LL) 42 (LL) 49 (L) 61 (L) 48 (L)   Diabetes history: Type 2 diabetes Outpatient Diabetes medications: Levemir 18 units daily, Novolog 2-10 units tid with meals, Glucotrol 10 mg bid Current orders for Inpatient glycemic control:  Glucotrol 10 mg bid, Novolog sensitive tid with meals, Levemir 18 units q HS  Please consider d/c of Glucotrol while patient is in the hospital.  Also consider decrease of Levemir to 5 units q HS-Hold if blood glucose is less than 100 mg/dL.  Thanks, [name] MeagerJenny Ruhi Kopke, RN, BC-ADM Inpatient Diabetes Coordinator Pager [phone]

## 2015-01-15 NOTE — Progress Notes (Signed)
Patient ID: [name] ReddenJerry [name], male   DOB: 05/06/1956, 59 y.o.   MRN: [id number] [facility] [place] [facility]   PATIENT NAME: [name] ReddenJerry [name]    MR#:  [id number]  DATE OF BIRTH:  09/14/1955  SUBJECTIVE:   Denies any complaints. Sugars in the 40's. Pt eating candies. Pt asymptomatic  REVIEW OF SYSTEMS:   ROS CONSTITUTIONAL: No fever, fatigue or weakness.  EYES: No blurred or double vision.  EARS, NOSE, AND THROAT: No tinnitus or ear pain.  RESPIRATORY: No cough, shortness of breath, wheezing or hemoptysis.  CARDIOVASCULAR: No chest pain, orthopnea, edema.  GASTROINTESTINAL: No nausea, vomiting, diarrhea or abdominal pain.  GENITOURINARY: No dysuria, hematuria.  ENDOCRINE: No polyuria, nocturia,  HEMATOLOGY: No anemia, easy bruising or bleeding SKIN: No rash or lesion. MUSCULOSKELETAL: No joint pain or arthritis.   NEUROLOGIC: No tingling, numbness, weakness.  PSYCHIATRY: No anxiety or depression.   DRUG ALLERGIES:   Allergies  Allergen Reactions  . Penicillins Hives  . Penicillin G Other (See Comments)    VITALS:  Blood pressure 98/57, pulse 69, temperature 97.5 F (36.4 C), temperature source Oral, resp. rate 18, height 6\' 1"  (1.854 m), weight 84.369 kg (186 lb), SpO2 100 %.  PHYSICAL EXAMINATION:  GENERAL:  59 y.o.-year-old patient lying in the bed with no acute distress.  EYES: Pupils equal, round, reactive to light and accommodation. No scleral icterus. Extraocular muscles intact.  HEENT: Head atraumatic, normocephalic. Oropharynx and nasopharynx clear.  NECK:  Supple, no jugular venous distention. No thyroid enlargement, no tenderness.  LUNGS: Normal breath sounds bilaterally, no wheezing, rales,rhonchi or crepitation. No use of accessory muscles of respiration.  CARDIOVASCULAR: S1, S2 normal. No murmurs, rubs, or gallops.  ABDOMEN: Soft, nontender, nondistended. Bowel sounds present. No organomegaly or mass.  EXTREMITIES: No pedal edema,  cyanosis, or clubbing.  NEUROLOGIC: Cranial nerves II through XII are intact. Muscle strength 5/5 in all extremities. Sensation intact. Gait not checked.  -gen weakness + PSYCHIATRIC: The patient is alert and oriented x 3.  SKIN: No obvious rash, lesion, or ulcer.    LABORATORY PANEL:   CBC  Recent Labs Lab 01/22/2015 1257 01/15/15 0548  WBC 7.1 8.9  HGB 9.2* 8.3*  HCT 30.1* 26.7*  PLT 271 195   ------------------------------------------------------------------------------------------------------------------  Chemistries   Recent Labs Lab 02/06/2015 1257 01/15/15 0548  NA  --  138  K  --  4.2  CL  --  102  CO2  --  23  GLUCOSE  --  54*  BUN  --  45*  CREATININE 9.08* 10.14*  CALCIUM  --  8.3*   ------------------------------------------------------------------------------------------------------------------  Cardiac Enzymes No results for input(s): TROPONINI in the last 168 hours. ------------------------------------------------------------------------------------------------------------------  RADIOLOGY:  No results found.  EKG:  No orders found for this or any previous visit.  ASSESSMENT AND PLAN:   1. Dialysis access site infection. -s/p Perm Cath removal by Dr schnier on 02/11/2015 -IV Vancomycin -Dr Wynelle Linkkolluru [facility] -BC from 01/21/2015 are negative -Cath tip cx 01/28/2015 neg so far  2. End-stage renal disease on hemodialysis. -HD to be resumed once HD cath available   3. Diabetes-2  -pt's sugars are very low 40-60's -hold off po glipizide and Insulin  4. Diabetic neuropathy and chronic pain. Continue outpatient medications  5. Essential Hypertension. -bp on the lower side -hold bp meds  CODE STATUS: Full code.      All the records are reviewed and case discussed with Care Management/Social  Workerr. Management plans discussed with the patient, family and they are in agreement.  CODE STATUS: full  TOTAL TIME  TAKING CARE OF THIS PATIENT: 35  minutes.   POSSIBLE D/C IN 2-3  DAYS, DEPENDING ON CLINICAL CONDITION.   Lashonne Shull M.D on 01/15/2015 at 1:50 PM  Between 7am to 6pm - Pager - [phone]  After 6pm go to [email or web address] - password [email or web address] [facility]  [facility]  Office  [phone]  CC: Primary care physician; No primary care provider on file.

## 2015-01-16 LAB — CBC
HCT: 26 % — ABNORMAL LOW (ref 40.0–52.0)
HEMOGLOBIN: 8 g/dL — AB (ref 13.0–18.0)
MCH: 27.2 pg (ref 26.0–34.0)
MCHC: 30.7 g/dL — AB (ref 32.0–36.0)
MCV: 88.6 fL (ref 80.0–100.0)
PLATELETS: 198 10*3/uL (ref 150–440)
RBC: 2.93 MIL/uL — ABNORMAL LOW (ref 4.40–5.90)
RDW: 19.1 % — AB (ref 11.5–14.5)
WBC: 7.4 10*3/uL (ref 3.8–10.6)

## 2015-01-16 LAB — GLUCOSE, CAPILLARY
GLUCOSE-CAPILLARY: 101 mg/dL — AB (ref 70–99)
GLUCOSE-CAPILLARY: 107 mg/dL — AB (ref 70–99)
GLUCOSE-CAPILLARY: 107 mg/dL — AB (ref 70–99)
GLUCOSE-CAPILLARY: 109 mg/dL — AB (ref 70–99)
GLUCOSE-CAPILLARY: 115 mg/dL — AB (ref 70–99)
Glucose-Capillary: 92 mg/dL (ref 70–99)
Glucose-Capillary: 115 mg/dL — ABNORMAL HIGH (ref 70–99)
Glucose-Capillary: 98 mg/dL (ref 70–99)
Glucose-Capillary: 124 mg/dL — ABNORMAL HIGH (ref 70–99)

## 2015-01-16 LAB — PROTIME-INR
INR: 1.6
Prothrombin Time: 19.2 seconds — ABNORMAL HIGH (ref 11.4–15.0)

## 2015-01-16 MED ORDER — SODIUM CHLORIDE 0.9 % IV BOLUS (SEPSIS)
500.0000 mL | Freq: Once | INTRAVENOUS | Status: AC
  Administered 2015-01-16: 07:00:00 500 mL via INTRAVENOUS

## 2015-01-16 NOTE — Progress Notes (Signed)
TC to Dr. Clint GuyHower regarding hypotension with review of history/actions. No new orders; continue to monitor and assess

## 2015-01-16 NOTE — Progress Notes (Signed)
[facility]  ROUNDING NOTE   Subjective:   Lethargic this morning. No labs.  C. Diff positive. Started on metronidazole.  Hypotensive last night. Bolus given last night Hypoglycemic overnight. Basal insulin stopped. On D10 gtt at 6975mL/hr Cultures negative.   Objective:  Vital signs in last 24 hours:  Temp:  [97.4 F (36.3 C)-98.2 F (36.8 C)] 97.8 F (36.6 C) (05/05 0541) Pulse Rate:  [79-92] 92 (05/05 0632) Resp:  [18-20] 18 (05/05 0541) BP: (86-103)/(55-63) 101/63 mmHg (05/05 0632) SpO2:  [96 %-97 %] 97 % (05/05 16100632) Weight:  [97.977 kg (216 lb)] 97.977 kg (216 lb) (05/04 2150)  Weight change: 13.608 kg (30 lb) Filed Weights   01/20/2015 1236 01/15/15 2150  Weight: 84.369 kg (186 lb) 97.977 kg (216 lb)    Intake/Output: I/O last 3 completed shifts: In: 1159.2 [P.O.:240; I.V.:919.2] Out: 200 [Urine:200]   Intake/Output this shift:  Total I/O In: 650 [I.V.:650] Out: -   Physical Exam: General: NAD, lethargic  Head: Normocephalic, atraumatic. Moist oral mucosal membranes  Eyes: Anicteric, PERRL  Neck: Supple, trachea midline  Lungs:  Clear to auscultation  Heart: Regular rate and rhythm  Abdomen:  Soft, nontender,   Extremities:  no peripheral edema.  Neurologic: Nonfocal, moving all four extremities  Skin: No lesions  Access: none    Basic Metabolic Panel:  Recent Labs Lab 01/13/2015 1257 01/15/15 0548  NA  --  138  K  --  4.2  CL  --  102  CO2  --  23  GLUCOSE  --  54*  BUN  --  45*  CREATININE 9.08* 10.14*  CALCIUM  --  8.3*    Liver Function Tests: No results for input(s): AST, ALT, ALKPHOS, BILITOT, PROT, ALBUMIN in the last 168 hours. No results for input(s): LIPASE, AMYLASE in the last 168 hours. No results for input(s): AMMONIA in the last 168 hours.  CBC:  Recent Labs Lab 01/29/2015 1257 01/15/15 0548  WBC 7.1 8.9  HGB 9.2* 8.3*  HCT 30.1* 26.7*  MCV 88.3 88.2  PLT 271 195    Cardiac Enzymes: No results for  input(s): CKTOTAL, CKMB, CKMBINDEX, TROPONINI in the last 168 hours.  BNP: Invalid input(s): POCBNP  CBG:  Recent Labs Lab 01/15/15 2332 01/16/15 0129 01/16/15 0323 01/16/15 0542 01/16/15 0736  GLUCAP 79 92 109* 115* 107*    Microbiology: Results for orders placed or performed during the hospital encounter of 02/02/2015  Culture, blood (routine x 2)     Status: None (Preliminary result)   Collection Time: 01/30/2015 12:57 PM  Result Value Ref Range Status   Specimen Description BLOOD  Final   Special Requests BLOOD  Final   Culture NO GROWTH 2 DAYS  Final   Report Status PENDING  Incomplete  Culture, blood (routine x 2)     Status: None (Preliminary result)   Collection Time: 01/25/2015 12:57 PM  Result Value Ref Range Status   Specimen Description BLOOD  Final   Special Requests BLOOD  Final   Culture NO GROWTH 2 DAYS  Final   Report Status PENDING  Incomplete  Cath Tip Culture     Status: None (Preliminary result)   Collection Time: 01/19/2015  5:57 PM  Result Value Ref Range Status   Specimen Description CATH TIP  Final   Special Requests NONE  Final   Culture NO GROWTH < 24 HOURS  Final   Report Status PENDING  Incomplete  C difficile quick scan w PCR reflex (  [facility])     Status: None   Collection Time: 01/15/15 11:02 AM  Result Value Ref Range Status   C Diff antigen POSITIVE  Final   C Diff toxin NEGATIVE  Final   C Diff interpretation   Final    Positive for toxigenic C. difficile, active toxin production not detected. Patient has toxigenic C. difficile organisms present in the bowel, but toxin was not detected. The patient may be a carrier or the level of toxin in the sample was below the limit  of detection. This information should be used in conjunction with the patient's clinical history when deciding on possible therapy.     Comment: CRITICAL RESULT CALLED TO, READ BACK BY AND VERIFIED WITH: [facility] @ 1400 01/15/15 BGB   Clostridium Difficile by PCR      Status: Abnormal   Collection Time: 01/15/15 11:02 AM  Result Value Ref Range Status   C difficile by pcr POSITIVE (A) NEGATIVE Final    Coagulation Studies:  Recent Labs  2015-01-26 2335 01/16/15 0612  LABPROT 19.0* 19.2*  INR 1.57 1.60    Urinalysis: No results for input(s): COLORURINE, LABSPEC, PHURINE, GLUCOSEU, HGBUR, BILIRUBINUR, KETONESUR, PROTEINUR, UROBILINOGEN, NITRITE, LEUKOCYTESUR in the last 72 hours.  Invalid input(s): APPERANCEUR    Imaging: No results found.   Medications:     . calcium acetate (Phos Binder)  1,334 mg Oral TID WC  . docusate sodium  100 mg Oral BID  . doxepin  50 mg Oral QHS  . DULoxetine  60 mg Oral Daily  . ezetimibe  10 mg Oral Daily  . famotidine  10 mg Oral BID  . feeding supplement (NEPRO CARB STEADY)  237 mL Oral BID BM  . fentaNYL  50 mcg Transdermal Q72H  . fluticasone  1 spray Each Nare Daily  . gabapentin  300 mg Oral QID  . insulin aspart  0-9 Units Subcutaneous TID WC  . insulin aspart  2-10 Units Subcutaneous TID AC  . lactulose  10 g Oral TID  . levothyroxine  125 mcg Oral QAC breakfast  . metroNIDAZOLE  500 mg Oral 3 times per day  . montelukast  10 mg Oral QHS  . multivitamin  1 tablet Oral Daily  . oxybutynin  5 mg Oral BID  . pantoprazole  40 mg Oral Daily  . polyethylene glycol  17 g Oral Daily  . tiotropium  18 mcg Inhalation Daily  . warfarin  2 mg Oral q1800  . Warfarin - Physician Dosing Inpatient   Does not apply q1800   acetaminophen **OR** acetaminophen, alum & mag hydroxide-simeth, hydrOXYzine, ondansetron **OR** ondansetron (ZOFRAN) IV, oxyCODONE, senna-docusate  Assessment/ Plan:  59 y.o. black male  With asthma, diabetes mellitus type II, diabetic retinopathy, hypertension, gout, hyperlipidemia, and morbid obesity and ESRD   [facility] [address] TTS first shift.   1. End stage renal disease: with infected catheter. Now removed. Last full treatment was Saturday.  Will monitor daily for dialysis  need. No acute indication for dialysis at this time. Monitor serum electrolytes, volume status and uremic symptoms.  Cultures negative so far - Labs to be drawn stat. May need dialysis later today. Patient made aware.   2. Hypertension: hypotensive.  - off metoprolol.   3. Anemia of chronic kidney disease: hemoglobin of 8.3. Last epo on Tuesday 5/3 - monitor CBC  4. Secondary Hyperparathyroidism: PTH elevated as outpatient at 1120, phos at goal at 4.9 -continue calcium acetate liquid for binding.  LOS: 2 Marvelle Span 5/5/201610:45 AM

## 2015-01-16 NOTE — Progress Notes (Signed)
S:  Patient lethargic but arouses easily  O:  AF VSS       Right thigh supple without abscess  A:  Infected dialysis catheter now removed      End stage renal disease  P:  Will plan for temp cath in the next day or two       More permanent access once infectin is cleared

## 2015-01-16 NOTE — Progress Notes (Addendum)
[facility] [place] at [facility]   PATIENT NAME: [name] ReddenJerry [name]    MR#:  [id number]  DATE OF BIRTH:  08/31/1956  SUBJECTIVE:  Doing well. No complaints  REVIEW OF SYSTEMS:   Review of Systems  Constitutional: Negative for fever, chills and weight loss.  HENT: Negative for ear discharge and ear pain.   Eyes: Negative for blurred vision and double vision.  Respiratory: Negative for cough and shortness of breath.   Cardiovascular: Negative for chest pain and palpitations.  Neurological: Positive for weakness. Negative for focal weakness and seizures.       Lethargic  Psychiatric/Behavioral: Negative for depression. The patient does not have insomnia.    Nutrition: yes Tolerating PT: yes Tolerating diet: yes  DRUG ALLERGIES:   Allergies  Allergen Reactions  . Penicillins Hives  . Penicillin G Other (See Comments)    VITALS:  Blood pressure 84/53, pulse 86, temperature 97.9 F (36.6 C), temperature source Oral, resp. rate 20, height 6\' 1"  (1.854 m), weight 97.977 kg (216 lb), SpO2 94 %.  PHYSICAL EXAMINATION:  GENERAL:  59 y.o.-year-old patient lying in the bed with no acute distress.  EYES: Pupils equal, round, reactive to light and accommodation. No scleral icterus. Extraocular muscles intact.  HEENT: Head atraumatic, normocephalic. Oropharynx and nasopharynx clear.  NECK:  Supple, no jugular venous distention. No thyroid enlargement, no tenderness.  LUNGS: Normal breath sounds bilaterally, no wheezing, rales,rhonchi or crepitation. No use of accessory muscles of respiration.  CARDIOVASCULAR: S1, S2 normal. No murmurs, rubs, or gallops.  ABDOMEN: Soft, nontender, nondistended. Bowel sounds present. No organomegaly or mass.  EXTREMITIES: No pedal edema, cyanosis, or clubbing.  NEUROLOGIC: Cranial nerves II through XII are intact. Muscle strength 5/5 in all extremities. Sensation intact. Gait not checked.  PSYCHIATRIC: The patient is alert and  oriented x 3.  SKIN: No obvious rash, lesion, or ulcer.    LABORATORY PANEL:   CBC  Recent Labs Lab 01/15/15 0548 01/16/15 1110  WBC 8.9 7.4  HGB 8.3* 8.0*  HCT 26.7* 26.0*  PLT 195 198   ------------------------------------------------------------------------------------------------------------------  Chemistries   Recent Labs Lab 02/01/2015 1257 01/15/15 0548  NA  --  138  K  --  4.2  CL  --  102  CO2  --  23  GLUCOSE  --  54*  BUN  --  45*  CREATININE 9.08* 10.14*  CALCIUM  --  8.3*   ------------------------------------------------------------------------------------------------------------------  Cardiac Enzymes No results for input(s): TROPONINI in the last 168 hours. ------------------------------------------------------------------------------------------------------------------  RADIOLOGY:  No results found.   ASSESSMENT AND PLAN:   1. Dialysis access site infection. -s/p Perm Cath removal by Dr schnier on 01/29/2015 -IV Vancomycin -Dr Wynelle Linkkolluru [facility] -BC from 02/11/2015 are negative -Cath tip cx 01/29/2015 neg so far  2. End-stage renal disease on hemodialysis. -HD to be resumed once HD cath available  3. Diabetes-2  -pt's sugars are very low 40-60's now stable after >90 -hold off po glipizide and Insulin -recvd IV dextrose 10%  4. Diabetic neuropathy and chronic pain. Continue outpatient medications  5. Essential Hypertension. -bp on the lower side -hold bp meds  CODE STATUS: Full code.      All the records are reviewed and case discussed with Care Management/Social Workerr. Management plans discussed with the patient, family and they are in agreement.  CODE STATUS: full code  TOTAL TIME TAKING CARE OF THIS PATIENT:35 minsminutes.   POSSIBLE D/C IN 1-2 DAYS, DEPENDING  ON CLINICAL CONDITION.   [name] M.D on 01/16/2015 at 2:41 PM  Between 7am to 6pm - Pager - [phone]  After 6pm go to  [email or web address] - password [email or web address] [email or web address]  [facility]  Office  [phone]  CC: Primary care physician; No primary care provider on file.

## 2015-01-16 NOTE — Evaluation (Signed)
Physical Therapy Evaluation Patient Details Name: [name] MRN: [id number] DOB: 03/22/1956 Today's Date: 01/16/2015   History of Present Illness  [age] yo male with onset of sepsis in dialysis port and confusion after being at HD session and coming to hosp for purulent drainage.  PMHx:  HTN, ESRD, PN, HLD, DM   Clinical Impression  Pt was seen for evaluation of functional status and to attempt to get OOB but is really not cognitively able to tolerate the instruction.  He is sleepy with extremely high creatinine as his session was interrupted to be admitted.  Will focus on trying to get to chair and try standing.    Follow Up Recommendations SNF    Equipment Recommendations  Rolling walker with 5" wheels    Recommendations for Other Services       Precautions / Restrictions Precautions Precautions: Fall Restrictions Weight Bearing Restrictions: No      Mobility  Bed Mobility Overal bed mobility: Needs Assistance;+2 for physical assistance;+ 2 for safety/equipment Bed Mobility: Supine to Sit     Supine to sit: Total assist;+2 for physical assistance     General bed mobility comments: Pt declining to help with moving  Transfers                 General transfer comment: cannot attempt due to confusion  Ambulation/Gait             General Gait Details: unable to attempt due to confusion  Stairs            Wheelchair Mobility    Modified Rankin (Stroke Patients Only)       Balance                                             Pertinent Vitals/Pain Pain Assessment: No/denies pain    Home Living Family/patient expects to be discharged to:: Private residence Living Arrangements: Other relatives Available Help at Discharge: Family Type of Home: House Home Access: Level entry     Home Layout: One level Home Equipment: Environmental consultantWalker - 4 wheels;Shower seat Additional Comments: Pt confused so may not be fully accurate    Prior  Function Level of Independence: Independent with assistive device(s)         Comments: Per pt     Hand Dominance        Extremity/Trunk Assessment   Upper Extremity Assessment: Difficult to assess due to impaired cognition           Lower Extremity Assessment: Difficult to assess due to impaired cognition      Cervical / Trunk Assessment: Normal  Communication   Communication: Expressive difficulties;Other (comment) (Confused and speech slurry)  Cognition Arousal/Alertness: Lethargic Behavior During Therapy: Flat affect Overall Cognitive Status: Impaired/Different from baseline Area of Impairment: Memory;Following commands;Safety/judgement;Awareness;Problem solving;Orientation;Attention Orientation Level: Disoriented to;Place;Time;Situation Current Attention Level: Divided Memory: Decreased recall of precautions;Decreased short-term memory Following Commands: Follows one step commands inconsistently Safety/Judgement: Decreased awareness of safety;Decreased awareness of deficits Awareness: Intellectual Problem Solving: Slow processing;Decreased initiation;Difficulty sequencing;Requires verbal cues;Requires tactile cues      General Comments General comments (skin integrity, edema, etc.): Pt lethargic and unsafe to try to move as he is unable to focus his efforts. Will need to see for tx to ensure he is able to move safely, but for now anticipate SNF admission due to his lethargy  Exercises        Assessment/Plan    PT Assessment Patient needs continued PT services  PT Diagnosis Generalized weakness;Altered mental status   PT Problem List Decreased strength;Decreased range of motion;Decreased activity tolerance;Decreased balance;Decreased mobility;Decreased coordination;Decreased cognition;Decreased safety awareness;Decreased skin integrity  PT Treatment Interventions DME instruction;Gait training;Functional mobility training;Therapeutic activities;Therapeutic  exercise;Balance training;Neuromuscular re-education;Cognitive remediation;Patient/family education   PT Goals (Current goals can be found in the Care Plan section) Acute Rehab PT Goals Patient Stated Goal: none stated PT Goal Formulation: With patient Time For Goal Achievement: 01/30/15 Potential to Achieve Goals: Good    Frequency Min 2X/week   Barriers to discharge Other (comment) (dependent for movement)      Co-evaluation               End of Session Equipment Utilized During Treatment: Oxygen Activity Tolerance: Patient limited by lethargy Patient left: in bed;with call bell/phone within reach;with bed alarm set Nurse Communication: Mobility status;Precautions         Time: [id number] PT Time Calculation (min) (ACUTE ONLY): 12 min   Charges:   PT Evaluation $Initial PT Evaluation Tier I: 1 Procedure     PT G CodesIvar [name]:        [name] 01/16/2015, 9:54 AM   Samul Dadauth Osie Amparo, PT MS Acute Rehab Dept. Number: [phone]

## 2015-01-17 LAB — RENAL FUNCTION PANEL
ALBUMIN: 2.3 g/dL — AB (ref 3.5–5.0)
Albumin: 2.3 g/dL — ABNORMAL LOW (ref 3.5–5.0)
Anion gap: 13 (ref 5–15)
Anion gap: 13 (ref 5–15)
BUN: 55 mg/dL — ABNORMAL HIGH (ref 6–20)
BUN: 60 mg/dL — ABNORMAL HIGH (ref 6–20)
CALCIUM: 8.3 mg/dL — AB (ref 8.9–10.3)
CHLORIDE: 98 mmol/L — AB (ref 101–111)
CO2: 24 mmol/L (ref 22–32)
CO2: 24 mmol/L (ref 22–32)
CREATININE: 11.33 mg/dL — AB (ref 0.61–1.24)
Calcium: 8.5 mg/dL — ABNORMAL LOW (ref 8.9–10.3)
Chloride: 97 mmol/L — ABNORMAL LOW (ref 101–111)
Creatinine, Ser: 10.9 mg/dL — ABNORMAL HIGH (ref 0.61–1.24)
GFR calc Af Amer: 5 mL/min — ABNORMAL LOW (ref >60)
GFR calc Af Amer: 5 mL/min — ABNORMAL LOW (ref >60)
GFR calc non Af Amer: 5 mL/min — ABNORMAL LOW (ref >60)
GFR, EST NON AFRICAN AMERICAN: 4 mL/min — AB (ref >60)
Glucose, Bld: 122 mg/dL — ABNORMAL HIGH (ref 65–99)
Glucose, Bld: 95 mg/dL (ref 65–99)
POTASSIUM: 4.5 mmol/L (ref 3.5–5.1)
Phosphorus: 6.8 mg/dL — ABNORMAL HIGH (ref 2.5–4.6)
Phosphorus: 7.3 mg/dL — ABNORMAL HIGH (ref 2.5–4.6)
Potassium: 4.2 mmol/L (ref 3.5–5.1)
Sodium: 134 mmol/L — ABNORMAL LOW (ref 135–145)
Sodium: 135 mmol/L (ref 135–145)

## 2015-01-17 LAB — CBC
HCT: 30.1 % — ABNORMAL LOW (ref 40.0–52.0)
HEMATOCRIT: 28.1 % — AB (ref 40.0–52.0)
Hemoglobin: 8.5 g/dL — ABNORMAL LOW (ref 13.0–18.0)
Hemoglobin: 9.1 g/dL — ABNORMAL LOW (ref 13.0–18.0)
MCH: 26.9 pg (ref 26.0–34.0)
MCH: 26.5 pg (ref 26.0–34.0)
MCHC: 30.2 g/dL — ABNORMAL LOW (ref 32.0–36.0)
MCHC: 30.3 g/dL — ABNORMAL LOW (ref 32.0–36.0)
MCV: 89.2 fL (ref 80.0–100.0)
MCV: 87.6 fL (ref 80.0–100.0)
PLATELETS: 231 10*3/uL (ref 150–440)
PLATELETS: 337 10*3/uL (ref 150–440)
RBC: 3.15 MIL/uL — AB (ref 4.40–5.90)
RBC: 3.44 MIL/uL — ABNORMAL LOW (ref 4.40–5.90)
RDW: 19.1 % — AB (ref 11.5–14.5)
RDW: 18.7 % — AB (ref 11.5–14.5)
WBC: 10 10*3/uL (ref 3.8–10.6)
WBC: 36.6 10*3/uL — ABNORMAL HIGH (ref 3.8–10.6)

## 2015-01-17 LAB — HEPATIC FUNCTION PANEL
ALT: 14 U/L — AB (ref 17–63)
AST: 29 U/L (ref 15–41)
Albumin: 2.2 g/dL — ABNORMAL LOW (ref 3.5–5.0)
Alkaline Phosphatase: 82 U/L (ref 38–126)
Bilirubin, Direct: 0.3 mg/dL (ref 0.1–0.5)
Indirect Bilirubin: 0.9 mg/dL (ref 0.3–0.9)
TOTAL PROTEIN: 6.4 g/dL — AB (ref 6.5–8.1)
Total Bilirubin: 1.2 mg/dL (ref 0.3–1.2)

## 2015-01-17 LAB — GLUCOSE, CAPILLARY
GLUCOSE-CAPILLARY: 123 mg/dL — AB (ref 70–99)
GLUCOSE-CAPILLARY: 107 mg/dL — AB (ref 70–99)
GLUCOSE-CAPILLARY: 122 mg/dL — AB (ref 70–99)
Glucose-Capillary: 108 mg/dL — ABNORMAL HIGH (ref 70–99)
Glucose-Capillary: 132 mg/dL — ABNORMAL HIGH (ref 70–99)
Glucose-Capillary: 127 mg/dL — ABNORMAL HIGH (ref 70–99)
Glucose-Capillary: 116 mg/dL — ABNORMAL HIGH (ref 70–99)
Glucose-Capillary: 131 mg/dL — ABNORMAL HIGH (ref 70–99)

## 2015-01-17 MED ORDER — NOREPINEPHRINE BITARTRATE 1 MG/ML IV SOLN: 0.0000 ug/min | INTRAVENOUS | Status: DC

## 2015-01-17 MED ORDER — HYDRALAZINE HCL 20 MG/ML IJ SOLN
10.0000 mg | INTRAMUSCULAR | Status: DC | PRN
Start: 2015-01-17 — End: 2015-01-24

## 2015-01-17 MED ORDER — SODIUM BICARBONATE 8.4 % IV SOLN
50.0000 meq | Freq: Once | INTRAVENOUS | Status: AC
  Administered 2015-01-17: 50 meq via INTRAVENOUS

## 2015-01-17 MED ORDER — NOREPINEPHRINE 4 MG/250ML-% IV SOLN
INTRAVENOUS | Status: AC
  Administered 2015-01-17: 4 mg
  Filled 2015-01-17: qty 250

## 2015-01-17 MED ORDER — HEPARIN SODIUM (PORCINE) 1000 UNIT/ML DIALYSIS
50.0000 [IU]/kg | INTRAMUSCULAR | Status: DC | PRN
  Administered 2015-01-17: 4900 [IU] via INTRAVENOUS_CENTRAL

## 2015-01-17 MED ORDER — EPOETIN ALFA 10000 UNIT/ML IJ SOLN
10000.0000 [IU] | Freq: Once | INTRAMUSCULAR | Status: AC
  Administered 2015-01-18: 10000 [IU] via INTRAVENOUS
  Filled 2015-01-17: qty 1

## 2015-01-17 MED ORDER — SODIUM BICARBONATE 8.4 % IV SOLN
INTRAVENOUS | Status: AC
  Administered 2015-01-17: 50 meq via INTRAVENOUS
  Filled 2015-01-17: qty 50

## 2015-01-17 MED ORDER — SODIUM CHLORIDE 0.9 % IV SOLN: 100.0000 mL | INTRAVENOUS | Status: DC | PRN

## 2015-01-17 MED ORDER — NOREPINEPHRINE 4 MG/250ML-% IV SOLN
0.0000 ug/min | INTRAVENOUS | Status: DC
  Administered 2015-01-18: 40 ug/min via INTRAVENOUS
  Filled 2015-01-17: qty 250

## 2015-01-17 NOTE — Progress Notes (Addendum)
[facility]  ROUNDING NOTE   Subjective:   More confused and lethargic. Seems to be having uremic symptoms. Full HD treatment has been last Saturday.   Objective:  Vital signs in last 24 hours:  Temp:  [97.7 F (36.5 C)-97.9 F (36.6 C)] 97.7 F (36.5 C) (05/06 1247) Pulse Rate:  [84-88] 85 (05/06 1247) Resp:  [18-20] 18 (05/06 0525) BP: (88-101)/(48-58) 94/58 mmHg (05/06 1247) SpO2:  [94 %-97 %] 94 % (05/06 1247)  Weight change:  Filed Weights   01/23/2015 1236 01/15/15 2150  Weight: 84.369 kg (186 lb) 97.977 kg (216 lb)    Intake/Output: I/O last 3 completed shifts: In: 1778.3 [P.O.:360; I.V.:1418.3] Out: 1 [Stool:1]   Intake/Output this shift:     Physical Exam: General: NAD, lethargic  Head: Normocephalic, atraumatic. Moist oral mucosal membranes  Eyes: Anicteric, PERRL  Neck: Supple, trachea midline  Lungs:  Clear to auscultation  Heart: Regular rate and rhythm  Abdomen:  Soft, nontender,   Extremities:  no peripheral edema.  Neurologic: Nonfocal, moving all four extremities  Skin: No lesions  Access: none    Basic Metabolic Panel:  Recent Labs Lab 01/20/2015 1257 01/15/15 0548 01/16/15 1110 01/17/15 0037  NA  --  138 134* 135  K  --  4.2 4.2 4.5  CL  --  102 97* 98*  CO2  --  23 24 24   GLUCOSE  --  54* 95 122*  BUN  --  45* 55* 60*  CREATININE 9.08* 10.14* 10.90* 11.33*  CALCIUM  --  8.3* 8.3* 8.5*  PHOS  --   --  6.8* 7.3*    Liver Function Tests:  Recent Labs Lab 01/16/15 1110 01/17/15 0037  ALBUMIN 2.3* 2.3*   No results for input(s): LIPASE, AMYLASE in the last 168 hours. No results for input(s): AMMONIA in the last 168 hours.  CBC:  Recent Labs Lab 01/13/2015 1257 01/15/15 0548 01/16/15 1110 01/17/15 0038  WBC 7.1 8.9 7.4 10.0  HGB 9.2* 8.3* 8.0* 8.5*  HCT 30.1* 26.7* 26.0* 28.1*  MCV 88.3 88.2 88.6 89.2  PLT 271 195 198 231    Cardiac Enzymes: No results for input(s): CKTOTAL, CKMB, CKMBINDEX, TROPONINI in  the last 168 hours.  BNP: Invalid input(s): POCBNP  CBG:  Recent Labs Lab 01/17/15 0356 01/17/15 0558 01/17/15 0804 01/17/15 1015 01/17/15 1243  GLUCAP 132* 123* 107* 122* 127*    Microbiology: Results for orders placed or performed during the hospital encounter of 01/30/2015  Culture, blood (routine x 2)     Status: None (Preliminary result)   Collection Time: 01/29/2015 12:57 PM  Result Value Ref Range Status   Specimen Description BLOOD  Final   Special Requests BLOOD  Final   Culture NO GROWTH 3 DAYS  Final   Report Status PENDING  Incomplete  Culture, blood (routine x 2)     Status: None (Preliminary result)   Collection Time: 01/18/2015 12:57 PM  Result Value Ref Range Status   Specimen Description BLOOD  Final   Special Requests BLOOD  Final   Culture NO GROWTH 3 DAYS  Final   Report Status PENDING  Incomplete  Cath Tip Culture     Status: None (Preliminary result)   Collection Time: 01/25/2015  5:57 PM  Result Value Ref Range Status   Specimen Description CATH TIP  Final   Special Requests NONE  Final   Culture NO GROWTH 3 DAYS  Final   Report Status PENDING  Incomplete  C  difficile quick scan w PCR reflex [facility])     Status: None   Collection Time: 01/15/15 11:02 AM  Result Value Ref Range Status   C Diff antigen POSITIVE  Final   C Diff toxin NEGATIVE  Final   C Diff interpretation   Final    Positive for toxigenic C. difficile, active toxin production not detected. Patient has toxigenic C. difficile organisms present in the bowel, but toxin was not detected. The patient may be a carrier or the level of toxin in the sample was below the limit  of detection. This information should be used in conjunction with the patient's clinical history when deciding on possible therapy.     Comment: CRITICAL RESULT CALLED TO, READ BACK BY AND VERIFIED WITH: [facility] @ 1400 01/15/15 BGB   Clostridium Difficile by PCR     Status: Abnormal   Collection Time: 01/15/15 11:02  AM  Result Value Ref Range Status   C difficile by pcr POSITIVE (A) NEGATIVE Final    Coagulation Studies:  Recent Labs  02/03/2015 2335 01/16/15 0612  LABPROT 19.0* 19.2*  INR 1.57 1.60    Urinalysis: No results for input(s): COLORURINE, LABSPEC, PHURINE, GLUCOSEU, HGBUR, BILIRUBINUR, KETONESUR, PROTEINUR, UROBILINOGEN, NITRITE, LEUKOCYTESUR in the last 72 hours.  Invalid input(s): APPERANCEUR    Imaging: No results found.   Medications:     . calcium acetate (Phos Binder)  1,334 mg Oral TID WC  . docusate sodium  100 mg Oral BID  . DULoxetine  60 mg Oral Daily  . ezetimibe  10 mg Oral Daily  . famotidine  10 mg Oral BID  . feeding supplement (NEPRO CARB STEADY)  237 mL Oral BID BM  . fluticasone  1 spray Each Nare Daily  . gabapentin  300 mg Oral QID  . lactulose  10 g Oral TID  . levothyroxine  125 mcg Oral QAC breakfast  . metroNIDAZOLE  500 mg Oral 3 times per day  . montelukast  10 mg Oral QHS  . multivitamin  1 tablet Oral Daily  . oxybutynin  5 mg Oral BID  . pantoprazole  40 mg Oral Daily  . polyethylene glycol  17 g Oral Daily  . tiotropium  18 mcg Inhalation Daily  . warfarin  2 mg Oral q1800  . Warfarin - Physician Dosing Inpatient   Does not apply q1800   acetaminophen **OR** acetaminophen, alum & mag hydroxide-simeth, hydrOXYzine, ondansetron **OR** ondansetron (ZOFRAN) IV, oxyCODONE, senna-docusate  Assessment/ Plan:  59 y.o. black male  With asthma, diabetes mellitus type II, diabetic retinopathy, hypertension, gout, hyperlipidemia, and morbid obesity and ESRD   [facility] [address] TTS first shift.   1. End stage renal disease: with infected catheter. Now removed. Last full treatment was Saturday. Now with uremic symptoms. Recommend hemodialysis today. Have discussed case with Dr. Gilda CreaseSchnier who plans on placing a HD temp catheter today.  - cultures are negative.   2. Hypertension: hypotensive.  - off metoprolol.   3. Anemia of chronic kidney  disease: hemoglobin of 8.5. Last epo on Tuesday 5/3 - monitor CBC. Epo with HD treatment.   4. Secondary Hyperparathyroidism: PTH elevated as outpatient at 1120, phos at goal at 4.9 -continue calcium acetate liquid for binding when back on diet.   5. Malnutrition/Cachexia: [id number]. Albumin dropped to 2.3 . Not eating well. And now NPO for procedure.  - on Nepro.     LOS: 3 Marrion Accomando, Threasa HeadsSARATH 5/6/20163:36 PM  Addendum:  Patient has left  femoral trialysis catheter placed on Dr. Gilda Crease at 10pm. Transferred to CCU. Placed on norepinephrine gtt. MAP now 65.  Placed on emergent hemodialysis: BFR 300 DFR 600 no UF  Check CBC, renal function panel, ABG, hepatic function panel.  Check CT - head and CXR 01/17/2015 11:05 PM

## 2015-01-17 NOTE — Progress Notes (Signed)
Patient became unresponsive 2045, flaccid, patient's B/p increased to 227/203, called rapid response, DR. Hower on floor, ordered to transfer to ICU, patient's b/p obtained again 77/45, nurse rechecked manually and now 80/48, patient's 02 sat 90%, patient is on room air, no s/s of pain,  2115, [facility] patient being transferred to ICU. Patient does not have access for dialysis, patient to have trialysis catheter placed this evening per DR. Schier.:

## 2015-01-17 NOTE — Progress Notes (Signed)
PRE HD   

## 2015-01-17 NOTE — Progress Notes (Signed)
Nutrition Follow-up  DOCUMENTATION CODES:     INTERVENTION:  Medical Nutrition Supplement:Continue nepro BID once diet restarted.    NUTRITION DIAGNOSIS:  Inadequate oral intake related to lethargy/confusion as evidenced by estimated needs, meal completion < 25%, ongoing    GOAL:  Patient will meet greater than or equal to 90% of their needs    MONITOR:  Electrolyte and renal profile Glucose profile Meals and snacks:  REASON FOR ASSESSMENT:  Other (Comment) follow-up    ASSESSMENT:  Pt  sleeping during visit this pm, unable to speak with pt at this time.  Planning removal of dialysis cath this pm.  Per I and O sheet intake bites-25% over last 3 days of admission  Medications: MVI, phos binder, lactulose Height:  Ht Readings from Last 1 Encounters:  02/06/2015 6\' 1"  (1.854 m)    Weight:  Wt Readings from Last 1 Encounters:  01/15/15 216 lb (97.977 kg)    Ideal Body Weight:     Wt Readings from Last 10 Encounters:  01/15/15 216 lb (97.977 kg)    BMI:  Body mass index is 28.5 kg/(m^2).  Estimated Nutritional Needs:  Kcal:  2240-2647kcals, BEE: 1697kcals  Protein:  102-128g protein (1.0-1.2g/kg)  Fluid:  UOP+105800mL   Diet Order:  Diet NPO time specified  EDUCATION NEEDS:  Education needs no appropriate at this time   Intake/Output Summary (Last 24 hours) at 01/17/15 1527 Last data filed at 01/17/15 0651  Gross per 24 hour  Intake    240 ml  Output      1 ml  Net    239 ml    Last BM:  5/06  Moderate Level of care  Nannette Zill B. Freida BusmanAllen, RD, LDN [phone] (pager)

## 2015-01-17 NOTE — Progress Notes (Signed)
Patient is still lethargic  as the RN saw him at 2330. V/S stable but his pupil are not reacting to light. Dr. Sheryle Hailiamond notified and he (MD) noted he's coming to assess the patient.

## 2015-01-17 NOTE — Progress Notes (Signed)
DR. Sheryle Hailiamond in at patient's bedside to  to see him. Stated the patient is ok and needed to switch him to IV ABT. Will wait for the new order.

## 2015-01-17 NOTE — Care Management (Signed)
Spoke with Mr. [name] at the bedside. States he lives at home with his brother. Brother is [name] [phone]). Chart review indicates that Mr. [name] to [facility].   Telephone call to [facility], spoke with intake coordinator. States that Mr. [name] was a resident March 4-12/19/14. Establ[facility] dialysis [facility] at [facility] [address] Tuesday-Thursday-Saturday. Physical therapy evaluation completed. Recommends skilled nursing facility. Dialysis and permacath today. Gwenette GreetBrenda S Holland RN MSN Care Management [phone]

## 2015-01-17 NOTE — Progress Notes (Signed)
Patient is resting quietly at this time with his eyes closed, respirations even and unlabored. No signs or symptoms of pain or any acute respiratory distress noted.  Contact precaution still in place for C' diff.  CBG  Every 2 hours continues with no abnormalities. Will continue to monitor.

## 2015-01-17 NOTE — Progress Notes (Signed)
Physical Therapy Treatment Patient Details Name: [name] ReddenJerry [name] MRN: [id number] DOB: 03/20/1956 Today's Date: 01/17/2015    History of Present Illness 59 yo male with onset of sepsis in dialysis port and confusion after being at HD session and coming to hosp for purulent drainage.  PMHx:  HTN, ESRD, PN, HLD, DM     PT Comments    Pt is having continual watery diarrhea which makes a transfer to chair impractical.  His skin would be an issue as he is unable to pressure relieve and unable to be cleaned up from the diarrhea.  SNF recommendation still holds as pt is willing to work with PT and is coming from PLOF much higher than current status.  Follow Up Recommendations  SNF     Equipment Recommendations  Rolling walker with 5" wheels    Recommendations for Other Services       Precautions / Restrictions Precautions Precautions: Fall Restrictions Weight Bearing Restrictions: No    Mobility  Bed Mobility Overal bed mobility: Needs Assistance Bed Mobility: Supine to Sit;Sit to Supine     Supine to sit: Total assist Sit to supine: Total assist   General bed mobility comments: Pt is more able to be focused to help but struggles to follow through  Transfers                 General transfer comment: cannot be safely attempted now  Ambulation/Gait             General Gait Details: unable   Stairs            Wheelchair Mobility    Modified Rankin (Stroke Patients Only)       Balance Overall balance assessment: Needs assistance Sitting-balance support: Feet supported;Bilateral upper extremity supported Sitting balance-Leahy Scale: Poor   Postural control: Posterior lean   Standing balance-Leahy Scale: Zero                      Cognition Arousal/Alertness: Lethargic Behavior During Therapy: Flat affect Overall Cognitive Status: Impaired/Different from baseline Area of Impairment: Memory;Following  commands;Safety/judgement;Awareness;Problem solving;Orientation;Attention Orientation Level: Place;Time;Situation Current Attention Level: Alternating Memory: Decreased recall of precautions;Decreased short-term memory Following Commands: Follows one step commands inconsistently Safety/Judgement: Decreased awareness of safety;Decreased awareness of deficits Awareness: Intellectual Problem Solving: Slow processing;Decreased initiation;Difficulty sequencing;Requires verbal cues;Requires tactile cues      Exercises General Exercises - Lower Extremity Short Arc Quad: AAROM;Both;10 reps Long Arc Quad: AAROM;Both;10 reps Heel Slides: AROM;AAROM;Both;10 reps Hip ABduction/ADduction: AAROM;Both;10 reps    General Comments General comments (skin integrity, edema, etc.): Pt is having continual watery diarrhea with PT intervention and used rolling practice to clean up but resoils pad immediately      Pertinent Vitals/Pain Pain Assessment: No/denies pain    Home Living                      Prior Function            PT Goals (current goals can now be found in the care plan section) Acute Rehab PT Goals Patient Stated Goal: none stated Progress towards PT goals: Progressing toward goals    Frequency  Min 2X/week    PT Plan Current plan remains appropriate    Co-evaluation             End of Session Equipment Utilized During Treatment: Oxygen Activity Tolerance: Patient limited by lethargy Patient left: in bed;with call bell/phone within reach;with bed alarm set  Time: [id number] PT Time Calculation (min) (ACUTE ONLY): 31 min  Charges:  $Therapeutic Exercise: 8-22 mins $Therapeutic Activity: 8-22 mins                    G Codes:      Ivar DrapeStout, Aqua Denslow E 01/17/2015, 12:03 PM   Samul Dadauth Sheilyn Boehlke, PT MS Acute Rehab Dept. Number: [phone]

## 2015-01-17 NOTE — Progress Notes (Signed)
Called by nursing staff for concern of elevated blood pressure and worsening mental status. Patient evaluated at bedside Original blood pressure reading per nursing staff 227/204. On exam lethargic/somnolent, localizes painful stimuli, has difficulty following simple commands. No focal neurological deficits, patient unable to perform test for asterixis.   Patient moved to intensive care unit, Dr. Gilda CreaseSchnier contacted for dialysis access placement Dr. Wynelle LinkKolluru (nephrology) contacted to initiate emergent dialysis. Patient's blood pressure dropped while in the ICU with systolic blood pressure in the 70s requiring vasopressors-started on levophed to maintain mean arterial pressure greater than 65 Dialysis initiated Follow lab data, will need CT head without contrast if mental status does not improve after dialysis  Condition stabilized Critical care time 60 minutes

## 2015-01-17 NOTE — Op Note (Signed)
  OPERATIVE NOTE   PROCEDURE: 1. Insertion of temporary dialysis catheter catheter right femoral approach.  PRE-OPERATIVE DIAGNOSIS: End-stage renal disease, mental status changes with worsening uremia, sepsis  POST-OPERATIVE DIAGNOSIS: Same  SURGEON: Renford DillsGregory G Vandy Fong M.D.  ANESTHESIA: 1% lidocaine local infiltration  ESTIMATED BLOOD LOSS: Minimal cc  INDICATIONS:   [name] is a 59 y.o. male who presents with mental status changes now requiring urgent dialysis. He was admitted earlier this week with staph sepsis secondary to a tunneled catheter. We have been attempting to maintain a catheter free time. So that he will clear his staph septicemia. This evening he has had significant mental status changes and it appears he will require urgent dialysis.  DESCRIPTION: After obtaining full informed written consent, the patient was positioned supine. The left groin was prepped and draped in a sterile fashion. Ultrasound was placed in a sterile sleeve. Ultrasound was utilized to identify the femoral vein which is noted to be echolucent and compressible indicating patency. Images recorded for the permanent record. Under real-time visualization a Seldinger needle is inserted into the vein and the guidewires advanced without difficulty. Small counterincision was made at the wire insertion site. Dilator is passed over the wire and the temporary dialysis catheter catheter is fed over the wire without difficulty.  All lumens aspirate and flush easily and are packed with heparin saline. Catheter secured to the skin of the thigh with 2-0 silk. A sterile dressing is applied with Biopatch.  COMPLICATIONS: None  CONDITION: Critical  Renford DillsGregory G Boomer Winders, M.D. [place] [facility]. Office:  [phone]

## 2015-01-17 NOTE — Progress Notes (Addendum)
[facility] [place] [facility]   PATIENT NAME: [name] ReddenJerry Woodson    MR#:  [id number]  DATE OF BIRTH:  01/05/1956  SUBJECTIVE:  More arousable than y'day. Tremors/shaking while trying to hold the water jar but unable to do so. weak  REVIEW OF SYSTEMS:   Review of Systems  Constitutional: Negative for fever, chills and weight loss.  HENT: Negative for hearing loss and sore throat.   Eyes: Negative for blurred vision, double vision and discharge.  Respiratory: Negative for cough, hemoptysis and sputum production.   Cardiovascular: Negative for chest pain, palpitations and orthopnea.  Gastrointestinal: Negative for nausea, vomiting, abdominal pain and diarrhea.  Genitourinary: Negative for dysuria and urgency.  Musculoskeletal: Negative for falls.  Neurological: Positive for weakness. Negative for speech change, focal weakness and headaches.  Psychiatric/Behavioral: Negative for depression. The patient is not nervous/anxious.    Nutrition: yes Tolerating PT: yes Tolerating diet: yes  DRUG ALLERGIES:   Allergies  Allergen Reactions  . Penicillins Hives  . Penicillin G Other (See Comments)    VITALS:  Blood pressure 94/58, pulse 85, temperature 97.7 F (36.5 C), temperature source Oral, resp. rate 18, height 6\' 1"  (1.854 m), weight 97.977 kg (216 lb), SpO2 94 %.  PHYSICAL EXAMINATION:  GENERAL:  59 y.o.-year-old patient lying in the bed with no acute distress.  EYES: Pupils equal, round, reactive to light and accommodation. No scleral icterus. Extraocular muscles intact.  HEENT: Head atraumatic, normocephalic. Oropharynx and nasopharynx clear.  NECK:  Supple, no jugular venous distention. No thyroid enlargement, no tenderness.  LUNGS: Normal breath sounds bilaterally, no wheezing, rales,rhonchi or crepitation. No use of accessory muscles of respiration.  CARDIOVASCULAR: S1, S2 normal. No murmurs, rubs, or gallops.  ABDOMEN: Soft, nontender,  nondistended. Bowel sounds present. No organomegaly or mass.  EXTREMITIES: No pedal edema, cyanosis, or clubbing.  NEUROLOGIC: lethargic today although arousable. Moves all ext well PSYCHIATRIC lethargic SKIN: No obvious rash, lesion, or ulcer.    LABORATORY PANEL:   CBC  Recent Labs Lab 01/16/15 1110 01/17/15 0038  WBC 7.4 10.0  HGB 8.0* 8.5*  HCT 26.0* 28.1*  PLT 198 231   ------------------------------------------------------------------------------------------------------------------  Chemistries   Recent Labs Lab 01/16/15 1110 01/17/15 0037  NA 134* 135  K 4.2 4.5  CL 97* 98*  CO2 24 24  GLUCOSE 95 122*  BUN 55* 60*  CREATININE 10.90* 11.33*  CALCIUM 8.3* 8.5*   ------------------------------------------------------------------------------------------------------------------  Cardiac Enzymes No results for input(s): TROPONINI in the last 168 hours. ------------------------------------------------------------------------------------------------------------------  RADIOLOGY:  No results found.   ASSESSMENT AND PLAN:   1. Dialysis access site infection. -s/p Perm Cath removal by Dr schnier on 01/27/2015 -IV Vancomycin -Dr Wynelle Linkkolluru reports positive BC ([facility]) -BC from 02/05/2015 are negative -Cath tip cx 02/07/2015 neg so far -dr schnier to place temp cath  2. End-stage renal disease on hemodialysis. -HD to be resumed once HD cath available  3. Diabetes-2  -pt's sugars are very low 40-60's now stable after >90 -hold off po glipizide and Insulin -recvd IV dextrose 10%  4.Clostridium diffcile  Positive -po flagyl  5. Essential Hypertension. -bp on the lower side -hold bp meds  CODE STATUS: Full code.      All the records are reviewed and case discussed with Care Management/Social Workerr. Management plans discussed with the patient, family and they are in agreement.  CODE STATUS: full code  TOTAL TIME TAKING CARE OF THIS  PATIENT:35 minsminutes.   POSSIBLE D/C IN  1-2 DAYS, DEPENDING ON CLINICAL CONDITION.   Izaya Netherton M.D on 01/17/2015 at 2:05 PM  Between 7am to 6pm - Pager - [phone]  After 6pm go to [email or web address] - password [email or web address] [facility]  [facility]  Office  [phone]  CC: Primary care physician; No primary care provider on file.

## 2015-01-17 NOTE — Progress Notes (Addendum)
Called to bedside because RN concerned that pt is lethargic.  States that pt's eyes are non-reactive but vitals stable.  Examined pt to find that he immediate awakens to sternal rub and voice.  His pupils are reactive and he moves his head some, but he is very drowsy and does not focus on examiner.  Labs show worsening uremia.  It does not appear that he he has had dialysis since for at least 4 days.  Receiving Flagyl (just completed Vanc) po for Cdiff.  No leukocytosis (although WBC increasing).  No s/s of sepsis at this time.  Scheduled for access placement today.   Physical examine normal.  No rubs or bleeding diatheses.   Filed Vitals:   01/17/15 0525  BP: 101/51  Pulse: 84  Temp: 97.7 F (36.5 C)  Resp: 18

## 2015-01-17 NOTE — Progress Notes (Signed)
   01/17/15 2100  Clinical Encounter Type  Visited With Patient  Visit Type Other (Comment) (Rapid Response)  Referral From Nurse  Consult/Referral To Chaplain  Spiritual Encounters  Spiritual Needs Other (Comment) (Rapid Response)  Stress Factors  Patient Stress Factors Health changes  Family Stress Factors None identified  Advance Directives (For Healthcare)  Does patient have an advance directive? No  Would patient like information on creating an advanced directive? No - patient declined information   Patient was a Rapid Response. No family was available at the time.  AD. [phone]

## 2015-01-17 NOTE — Progress Notes (Signed)
[facility] and is able to return at d/c. Full eval to follow. Dellie BurnsJosie Nyasiah Moffet, MSW, LCSW [phone] (1C coverage)

## 2015-01-17 NOTE — Progress Notes (Signed)
HD START 

## 2015-01-18 ENCOUNTER — Inpatient Hospital Stay: Payer: Medicare Other

## 2015-01-18 LAB — CBC
HEMATOCRIT: 29.9 % — AB (ref 40.0–52.0)
HEMATOCRIT: 29.4 % — AB (ref 40.0–52.0)
Hemoglobin: 9.1 g/dL — ABNORMAL LOW (ref 13.0–18.0)
Hemoglobin: 8.8 g/dL — ABNORMAL LOW (ref 13.0–18.0)
MCH: 26.8 pg (ref 26.0–34.0)
MCH: 26.3 pg (ref 26.0–34.0)
MCHC: 30.4 g/dL — ABNORMAL LOW (ref 32.0–36.0)
MCHC: 30 g/dL — AB (ref 32.0–36.0)
MCV: 88 fL (ref 80.0–100.0)
MCV: 87.7 fL (ref 80.0–100.0)
PLATELETS: 308 10*3/uL (ref 150–440)
PLATELETS: 298 10*3/uL (ref 150–440)
RBC: 3.4 MIL/uL — ABNORMAL LOW (ref 4.40–5.90)
RBC: 3.35 MIL/uL — ABNORMAL LOW (ref 4.40–5.90)
RDW: 18.9 % — AB (ref 11.5–14.5)
RDW: 19.1 % — AB (ref 11.5–14.5)
WBC: 38.3 10*3/uL — AB (ref 3.8–10.6)
WBC: 47.5 10*3/uL — AB (ref 3.8–10.6)

## 2015-01-18 LAB — GLUCOSE, CAPILLARY
GLUCOSE-CAPILLARY: 133 mg/dL — AB (ref 70–99)
Glucose-Capillary: 116 mg/dL — ABNORMAL HIGH (ref 70–99)
Glucose-Capillary: 128 mg/dL — ABNORMAL HIGH (ref 70–99)
Glucose-Capillary: 134 mg/dL — ABNORMAL HIGH (ref 70–99)
Glucose-Capillary: 151 mg/dL — ABNORMAL HIGH (ref 70–99)

## 2015-01-18 LAB — RENAL FUNCTION PANEL
ANION GAP: 14 (ref 5–15)
Albumin: 2.2 g/dL — ABNORMAL LOW (ref 3.5–5.0)
Albumin: 1.9 g/dL — ABNORMAL LOW (ref 3.5–5.0)
Anion gap: 10 (ref 5–15)
BUN: 67 mg/dL — ABNORMAL HIGH (ref 6–20)
BUN: 44 mg/dL — ABNORMAL HIGH (ref 6–20)
CO2: 25 mmol/L (ref 22–32)
CO2: 27 mmol/L (ref 22–32)
Calcium: 8.5 mg/dL — ABNORMAL LOW (ref 8.9–10.3)
Calcium: 8.3 mg/dL — ABNORMAL LOW (ref 8.9–10.3)
Chloride: 96 mmol/L — ABNORMAL LOW (ref 101–111)
Chloride: 96 mmol/L — ABNORMAL LOW (ref 101–111)
Creatinine, Ser: 12.03 mg/dL — ABNORMAL HIGH (ref 0.61–1.24)
Creatinine, Ser: 8.11 mg/dL — ABNORMAL HIGH (ref 0.61–1.24)
GFR calc non Af Amer: 7 mL/min — ABNORMAL LOW (ref >60)
GFR, EST AFRICAN AMERICAN: 5 mL/min — AB (ref >60)
GFR, EST AFRICAN AMERICAN: 8 mL/min — AB (ref >60)
GFR, EST NON AFRICAN AMERICAN: 4 mL/min — AB (ref >60)
GLUCOSE: 128 mg/dL — AB (ref 65–99)
Glucose, Bld: 147 mg/dL — ABNORMAL HIGH (ref 65–99)
PHOSPHORUS: 7.8 mg/dL — AB (ref 2.5–4.6)
POTASSIUM: 4.9 mmol/L (ref 3.5–5.1)
POTASSIUM: 4.5 mmol/L (ref 3.5–5.1)
Phosphorus: 6.1 mg/dL — ABNORMAL HIGH (ref 2.5–4.6)
SODIUM: 135 mmol/L (ref 135–145)
SODIUM: 133 mmol/L — AB (ref 135–145)

## 2015-01-18 LAB — CATH TIP CULTURE: CULTURE: NO GROWTH

## 2015-01-18 MED ORDER — METRONIDAZOLE IN NACL 5-0.79 MG/ML-% IV SOLN
500.0000 mg | Freq: Three times a day (TID) | INTRAVENOUS | Status: DC
  Administered 2015-01-18 – 2015-01-21 (×10): 500 mg via INTRAVENOUS
  Filled 2015-01-18 (×14): qty 100

## 2015-01-18 MED ORDER — EPOETIN ALFA 10000 UNIT/ML IJ SOLN
10000.0000 [IU] | Freq: Once | INTRAMUSCULAR | Status: AC
  Administered 2015-01-18: 10000 [IU] via INTRAVENOUS
  Filled 2015-01-18: qty 1

## 2015-01-18 MED ORDER — LEVOFLOXACIN IN D5W 500 MG/100ML IV SOLN
500.0000 mg | INTRAVENOUS | Status: DC
  Administered 2015-01-19 – 2015-01-22 (×3): 500 mg via INTRAVENOUS
  Filled 2015-01-18 (×5): qty 100

## 2015-01-18 MED ORDER — SODIUM CHLORIDE 0.9 % IV SOLN: 100.0000 mL | INTRAVENOUS | Status: DC | PRN

## 2015-01-18 MED ORDER — VANCOMYCIN HCL IN DEXTROSE 1-5 GM/200ML-% IV SOLN
1000.0000 mg | INTRAVENOUS | Status: DC | PRN
  Administered 2015-01-18: 1000 mg via INTRAVENOUS
  Filled 2015-01-18 (×4): qty 200

## 2015-01-18 MED ORDER — DEXTROSE 5 % IV SOLN
0.0000 ug/min | INTRAVENOUS | Status: DC
  Administered 2015-01-18: 30 ug/min via INTRAVENOUS
  Administered 2015-01-20: 5 ug/min via INTRAVENOUS
  Administered 2015-01-22: 3 ug/min via INTRAVENOUS
  Administered 2015-01-22: 5 ug/min via INTRAVENOUS
  Administered 2015-01-22: 2 ug/min via INTRAVENOUS
  Administered 2015-01-23: 8 ug/min via INTRAVENOUS
  Administered 2015-01-23: 4 ug/min via INTRAVENOUS
  Administered 2015-01-24: 30 ug/min via INTRAVENOUS
  Administered 2015-01-24: 33 ug/min via INTRAVENOUS
  Filled 2015-01-18 (×7): qty 16

## 2015-01-18 NOTE — Progress Notes (Signed)
[facility]  ROUNDING NOTE   Subjective:   Emergent hemodialysis yesterday for encephalopathy and acute altered mental status.  Mental status has improved. However continues to be on norepinphrine gtt. 12 mcg  Scheduled for more dialysis treatment today.  Not swallowing pills.   Objective:  Vital signs in last 24 hours:  Temp:  [97.5 F (36.4 C)-98.3 F (36.8 C)] 97.5 F (36.4 C) (05/07 0800) Pulse Rate:  [73-109] 92 (05/07 0900) Resp:  [13-20] 20 (05/07 0900) BP: (57-192)/(27-167) 95/63 mmHg (05/07 0900) SpO2:  [94 %-100 %] 100 % (05/07 0900) Weight:  [88.7 kg (195 lb 8.8 oz)-89.3 kg (196 lb 13.9 oz)] 88.7 kg (195 lb 8.8 oz) (05/07 0130)  Weight change:  Filed Weights   01/15/15 2150 01/17/15 2300 01/18/15 0130  Weight: 97.977 kg (216 lb) 89.3 kg (196 lb 13.9 oz) 88.7 kg (195 lb 8.8 oz)    Intake/Output: I/O last 3 completed shifts: In: 485.1 [I.V.:485.1] Out: 1 [Stool:1]   Intake/Output this shift:     Physical Exam: General: Critically ill, lethargic  Head: Normocephalic, atraumatic. Moist oral mucosal membranes  Eyes: Anicteric, PERRL, +icuterus  Neck: Supple, trachea midline  Lungs:  Diminished on right   Heart: Regular rate and rhythm  Abdomen:  Mild guarding  Extremities:  no peripheral edema.  Neurologic: Nonfocal, moving all four extremities  Skin: No lesions  Access: Right femoral trialysis catheter 5/6    Basic Metabolic Panel:  Recent Labs Lab 01/19/2015 1257  01/15/15 0548 01/16/15 1110 01/17/15 0037 01/17/15 2250  NA  --   --  138 134* 135 135  K  --   --  4.2 4.2 4.5 4.9  CL  --   --  102 97* 98* 96*  CO2  --   --  23 24 24 25   GLUCOSE  --   --  54* 95 122* 128*  BUN  --   --  45* 55* 60* 67*  CREATININE 9.08*  --  10.14* 10.90* 11.33* 12.03*  CALCIUM  --   < > 8.3* 8.3* 8.5* 8.5*  PHOS  --   --   --  6.8* 7.3* 7.8*  < > = values in this interval not displayed.  Liver Function Tests:  Recent Labs Lab 01/16/15 1110  01/17/15 0037 01/17/15 2250  AST  --   --  29  ALT  --   --  14*  ALKPHOS  --   --  82  BILITOT  --   --  1.2  PROT  --   --  6.4*  ALBUMIN 2.3* 2.3* 2.2*  2.2*   No results for input(s): LIPASE, AMYLASE in the last 168 hours. No results for input(s): AMMONIA in the last 168 hours.  CBC:  Recent Labs Lab 01/15/2015 1257 01/15/15 0548 01/16/15 1110 01/17/15 0038 01/17/15 2250  WBC 7.1 8.9 7.4 10.0 38.3*  36.6*  HGB 9.2* 8.3* 8.0* 8.5* 9.1*  9.1*  HCT 30.1* 26.7* 26.0* 28.1* 29.9*  30.1*  MCV 88.3 88.2 88.6 89.2 88.0  87.6  PLT 271 195 198 231 308  337    Cardiac Enzymes: No results for input(s): CKTOTAL, CKMB, CKMBINDEX, TROPONINI in the last 168 hours.  BNP: Invalid input(s): POCBNP  CBG:  Recent Labs Lab 01/17/15 2110 01/17/15 2143 01/18/15 0045 01/18/15 0621 01/18/15 0756  GLUCAP 116* 131* 116* 151* 133*    Microbiology: Results for orders placed or performed during the hospital encounter of 01/16/2015  Culture, blood (routine x  2)     Status: None (Preliminary result)   Collection Time: 01/13/2015 12:57 PM  Result Value Ref Range Status   Specimen Description BLOOD  Final   Special Requests BLOOD  Final   Culture NO GROWTH 3 DAYS  Final   Report Status PENDING  Incomplete  Culture, blood (routine x 2)     Status: None (Preliminary result)   Collection Time: 02/01/2015 12:57 PM  Result Value Ref Range Status   Specimen Description BLOOD  Final   Special Requests BLOOD  Final   Culture NO GROWTH 3 DAYS  Final   Report Status PENDING  Incomplete  Cath Tip Culture     Status: None (Preliminary result)   Collection Time: 01/20/2015  5:57 PM  Result Value Ref Range Status   Specimen Description CATH TIP  Final   Special Requests NONE  Final   Culture NO GROWTH 3 DAYS  Final   Report Status PENDING  Incomplete  C difficile quick scan w PCR reflex [facility])     Status: None   Collection Time: 01/15/15 11:02 AM  Result Value Ref Range Status   C Diff  antigen POSITIVE  Final   C Diff toxin NEGATIVE  Final   C Diff interpretation   Final    Positive for toxigenic C. difficile, active toxin production not detected. Patient has toxigenic C. difficile organisms present in the bowel, but toxin was not detected. The patient may be a carrier or the level of toxin in the sample was below the limit  of detection. This information should be used in conjunction with the patient's clinical history when deciding on possible therapy.     Comment: CRITICAL RESULT CALLED TO, READ BACK BY AND VERIFIED WITH: Zettie CooleyMARY WITHERSPOON @ 1400 01/15/15 BGB   Clostridium Difficile by PCR     Status: Abnormal   Collection Time: 01/15/15 11:02 AM  Result Value Ref Range Status   C difficile by pcr POSITIVE (A) NEGATIVE Final    Coagulation Studies:  Recent Labs  01/16/15 0612  LABPROT 19.2*  INR 1.60    Urinalysis: No results for input(s): COLORURINE, LABSPEC, PHURINE, GLUCOSEU, HGBUR, BILIRUBINUR, KETONESUR, PROTEINUR, UROBILINOGEN, NITRITE, LEUKOCYTESUR in the last 72 hours.  Invalid input(s): APPERANCEUR    Imaging: Dg Chest Port 1 View  01/18/2015   CLINICAL DATA:  Acute encephalopathy  EXAM: PORTABLE CHEST - 1 VIEW  COMPARISON:  12/26/2014  FINDINGS: Opacity persists in the lateral right lung although it is decreasing compare to the prior studies. No new or worsening airspace opacities are evident. No effusions are evident. There is unchanged mild cardiomegaly. There is unchanged mild right hemidiaphragm elevation.  IMPRESSION: Focal airspace opacity in the lateral right lung may be infectious or inflammatory. It involves a smaller portion of the lung compared to the prior studies of 12/26/2014 and 11/13/2014. The serial changes over these recent radiographs argues against a neoplastic process.   Electronically Signed   By: Ellery Plunkaniel R Mitchell M.D.   On: 01/18/2015 03:06     Medications:   . norepinephrine (LEVOPHED) Adult infusion 25 mcg/min (01/18/15 0630)    . calcium acetate (Phos Binder)  1,334 mg Oral TID WC  . docusate sodium  100 mg Oral BID  . DULoxetine  60 mg Oral Daily  . ezetimibe  10 mg Oral Daily  . famotidine  10 mg Oral BID  . feeding supplement (NEPRO CARB STEADY)  237 mL Oral BID BM  . fluticasone  1 spray Each  Nare Daily  . gabapentin  300 mg Oral QID  . lactulose  10 g Oral TID  . levothyroxine  125 mcg Oral QAC breakfast  . metroNIDAZOLE  500 mg Oral 3 times per day  . montelukast  10 mg Oral QHS  . multivitamin  1 tablet Oral Daily  . oxybutynin  5 mg Oral BID  . pantoprazole  40 mg Oral Daily  . polyethylene glycol  17 g Oral Daily  . tiotropium  18 mcg Inhalation Daily  . warfarin  2 mg Oral q1800  . Warfarin - Physician Dosing Inpatient   Does not apply q1800   acetaminophen **OR** acetaminophen, alum & mag hydroxide-simeth, hydrALAZINE, hydrOXYzine, ondansetron **OR** ondansetron (ZOFRAN) IV, oxyCODONE, senna-docusate  Assessment/ Plan:  59 y.o. black male  With asthma, diabetes mellitus type II, diabetic retinopathy, hypertension, gout, hyperlipidemia, and morbid obesity and ESRD   [facility] [address] TTS first shift.   1. End stage renal disease: with infected catheter. Now removed.  Hemodialysis treatment yesterday for uremic encephalopathy.  Schedule extra treatment today to help improve mental status.  Will need tunnelled catheter eventually. Cultures are negative.  Patient is running out of access options  2. Metabolic Encephalopathy:G93.41  improved some with dialysis. Uremic encephalopathy but may have had an ischemic event.  - Check CT Head.  - holding PO medications. If need be, will place NGT.  - switch abx: metronidazole to IV for now.   3. Right upper lobe consolidation: J18.9 concern for acute pneumonia.  - cultures negative, afebrile.  - however with hypotension. Concern for sepsis.   4. Hypotension: concern for sepsis. Vasopressors for hemodynamic instability. Norepinephrine.  -  holding home metoprolol.    5. Anemia of chronic kidney disease: hemoglobin of 9.1 - epo scheduled for later today. Last epo on Tuesday 5/3. EPO if no ischemic event  6. Secondary Hyperparathyroidism: PTH elevated as outpatient at 1120, phos at goal at 4.9 -continue calcium acetate liquid for binding when back on diet.   7. Malnutrition/Cachexia: O96. Albumin dropped to 2.3 . Not eating well. And now NPO  - May need NGT with tube feeds. .     LOS: 4 Markise Haymer 5/7/201610:42 AM

## 2015-01-18 NOTE — Progress Notes (Signed)
This note also relates to the following rows which could not be included: Pulse Rate - Cannot attach notes to unvalidated device data Resp - Cannot attach notes to unvalidated device data BP - Cannot attach notes to unvalidated device data SpO2 - Cannot attach notes to unvalidated device data   HD tx start 

## 2015-01-18 NOTE — Significant Event (Signed)
Rapid Response Event Note  Overview: Time Called: 2048 Arrival Time: 2052 Event Type: Neurologic, Hypotension, Other (Comment) (rapid response called at 2048 for 1c patient, more lethargic)  Initial Focused Assessment:   Interventions:   Event Summary: Name of Physician Notified: dr. Clint Guyhower at 2048 (dr hower at bedside when responding to rapid response call)  Name of Consulting Physician Notified: kolloru, schnier (dr Ronn Melenakolloru notified and returned to hospital to reassess pt) at 2120 (dr kolluru notifed by dr Clint Guyhower of patient status change)  Outcome: Transferred (Comment) (patient transferred to ICU for emergent line & hemodialysis)  Event End Time: 2138 (arrived in ICU at 2138, dr schnier placed line, hd started)  [name] P

## 2015-01-18 NOTE — Progress Notes (Signed)
[facility] [place] at [facility]   PATIENT NAME: [name] ReddenJerry Herder    MR#:  [id number]  DATE OF BIRTH:  02/15/1956  SUBJECTIVE:   Still lethargic, difficult to understand.  S/p urgent dialysis yesterday but mental status has not improved.  Follows some simple commands.    REVIEW OF SYSTEMS:   Review of Systems  Constitutional: Negative for fever and chills.  Eyes: Negative for blurred vision and double vision.  Respiratory: Negative for cough, hemoptysis, shortness of breath and wheezing.   Cardiovascular: Negative for chest pain, palpitations and PND.  Gastrointestinal: Negative for nausea, vomiting, diarrhea, constipation and melena.  Neurological: Positive for weakness. Negative for tremors, speech change, focal weakness and seizures.  All other systems reviewed and are negative.  Nutrition: No Tolerating PT: No Tolerating diet: No due to encephalopathy.   DRUG ALLERGIES:   Allergies  Allergen Reactions  . Penicillins Hives  . Penicillin G Other (See Comments)    VITALS:  Blood pressure 84/57, pulse 94, temperature 97.6 F (36.4 C), temperature source Oral, resp. rate 16, height 6\' 1"  (1.854 m), weight 88.7 kg (195 lb 8.8 oz), SpO2 100 %.  PHYSICAL EXAMINATION:  GENERAL:  59 y.o.-year-old patient lying in the bed with no acute distress.  EYES: Pupils equal, round, reactive to light and accommodation. No scleral icterus. Extraocular muscles intact.  HEENT: Head atraumatic, normocephalic. Oropharynx and nasopharynx clear.  NECK:  Supple, no jugular venous distention. No thyroid enlargement, no tenderness.  LUNGS: Normal breath sounds bilaterally, no wheezing, rales,rhonchi or crepitation. No use of accessory muscles of respiration.  CARDIOVASCULAR: S1, S2 normal. No murmurs, rubs, or gallops.  ABDOMEN: Soft, nontender, nondistended. Bowel sounds present. No organomegaly or mass.  EXTREMITIES: No pedal edema, cyanosis, or clubbing.  NEUROLOGIC:  remains lethargic, encephalopathic, follows simple commands. Slurred speech. PSYCHIATRIC: Encephalopathic but alert.  SKIN: No obvious rash, lesion, or ulcer.    LABORATORY PANEL:   CBC  Recent Labs Lab 01/17/15 0038 01/17/15 2250  WBC 10.0 38.3*  36.6*  HGB 8.5* 9.1*  9.1*  HCT 28.1* 29.9*  30.1*  PLT 231 308  337   ------------------------------------------------------------------------------------------------------------------  Chemistries   Recent Labs Lab 01/17/15 0037 01/17/15 2250  NA 135 135  K 4.5 4.9  CL 98* 96*  CO2 24 25  GLUCOSE 122* 128*  BUN 60* 67*  CREATININE 11.33* 12.03*  CALCIUM 8.5* 8.5*  AST  --  29  ALT  --  14*  ALKPHOS  --  82  BILITOT  --  1.2   ------------------------------------------------------------------------------------------------------------------  Cardiac Enzymes No results for input(s): TROPONINI in the last 168 hours. ------------------------------------------------------------------------------------------------------------------  RADIOLOGY:  Ct Head Wo Contrast  01/18/2015   CLINICAL DATA:  Altered mental status. Onset of symptoms last night. Abnormal speech.  EXAM: CT HEAD WITHOUT CONTRAST  TECHNIQUE: Contiguous axial images were obtained from the base of the skull through the vertex without intravenous contrast.  COMPARISON:  None.  FINDINGS: No mass lesion, mass effect, midline shift, hydrocephalus, hemorrhage. No territorial ischemia or acute infarction. Extensive scalp vessel atherosclerotic calcification. Mastoid air cells appear clear. Intracranial atherosclerosis. Paranasal sinuses are within normal limits. No aggressive osseous lesions.  IMPRESSION: Negative CT head.   Electronically Signed   By: Andreas NewportGeoffrey  Lamke M.D.   On: 01/18/2015 13:28   Dg Chest Port 1 View  01/18/2015   CLINICAL DATA:  Acute encephalopathy  EXAM: PORTABLE CHEST - 1 VIEW  COMPARISON:  12/26/2014  FINDINGS: Opacity persists in the  lateral right  lung although it is decreasing compare to the prior studies. No new or worsening airspace opacities are evident. No effusions are evident. There is unchanged mild cardiomegaly. There is unchanged mild right hemidiaphragm elevation.  IMPRESSION: Focal airspace opacity in the lateral right lung may be infectious or inflammatory. It involves a smaller portion of the lung compared to the prior studies of 12/26/2014 and 11/13/2014. The serial changes over these recent radiographs argues against a neoplastic process.   Electronically Signed   By: Ellery Plunkaniel R Mitchell M.D.   On: 01/18/2015 03:06     ASSESSMENT AND PLAN:   * Dialysis access site infection - BC at outpatient dialysis were + but results pending [facility] Dr schnier on 02/10/2015 - cont. IV Vancomycin - BC from 02/03/2015 & Cath tip cx 01/15/2015 neg so far - temp cath placed yesterday.   * End-stage renal disease on hemodialysis. - s/p urgent HD yesterday due to AMS/Hypertensive urgency - had HD yesterday and cont. Care as per Nephrology.   * AMS/Encephalopathy - thought to be related to uremia but pt. Is s/p HD yesterday and mental status not much improved.  - CT head (-).  Will get Neuro eval.   * Diabetes type II w/out complication.  - BS stable now but pt. Not eating much.  -hold off po glipizide and Insulin  * Clostridium difficile colitis - cont. po flagyl  * Essential Hypertension. - bp on the lower side - hold bp meds  * DM Neuropathy - cont. Neurontin.   * COPD - no acute exacerbation.  - cont. Spiriva.   * Depression - cont. Cymbalta  CODE STATUS: Full code.      All the records are reviewed and case discussed with Care Management/Social Workerr. Management plans discussed with the patient, family and they are in agreement.  CODE STATUS: full code  TOTAL Critical care TIME TAKING CARE OF THIS PATIENT: 30 mins   SAINANI,VIVEK J M.D on 01/18/2015 at 1:35 PM  Between 7am to 6pm - Pager -  [phone]  After 6pm go to [email or web address] - password [email or web address] [facility]  [facility]  Office  [phone]  CC: Primary care physician; No primary care provider on file.

## 2015-01-18 NOTE — Progress Notes (Signed)
POST HD 

## 2015-01-18 NOTE — Progress Notes (Signed)
HD END 

## 2015-01-18 NOTE — Progress Notes (Signed)
[facility]  Daily Progress Note   Subjective  -   The patient remains with severely depressed mental status and is noncommunicative he was dialyzed last night and is now currently undergoing dialysis without incident  Objective Filed Vitals:   01/18/15 1800 01/18/15 1815 01/18/15 1830 01/18/15 1845  BP: 97/56 95/62 94/56  94/60  Pulse: 85 88 89 88  Temp:      TempSrc:      Resp: 13  15 12   Height:      Weight:      SpO2: 100%  100% 100%    Intake/Output Summary (Last 24 hours) at 01/18/15 1859 Last data filed at 01/18/15 0700  Gross per 24 hour  Intake 485.09 ml  Output      0 ml  Net 485.09 ml    PULM  moderate increased effort , no use of accessory muscles CV  No JVD, RRR Abd      No distended, nontender VASC  temporary dialysis catheter left femoral with good flow at 300 cc/m              Right groin without evidence of purulence or induration status post infected catheter removal  Laboratory CBC    Component Value Date/Time   WBC 47.5* 01/18/2015 1720   WBC 9.6 12/26/2014 1150   HGB 8.8* 01/18/2015 1720   HGB 10.1* 12/26/2014 1150   HCT 29.4* 01/18/2015 1720   HCT 32.3* 12/26/2014 1150   PLT 298 01/18/2015 1720   PLT 144* 12/26/2014 1150    BMET    Component Value Date/Time   NA 133* 01/18/2015 1720   NA 136 12/26/2014 1150   K 4.5 01/18/2015 1720   K 4.0 12/26/2014 1150   CL 96* 01/18/2015 1720   CL 99* 12/26/2014 1150   CO2 27 01/18/2015 1720   CO2 30 12/26/2014 1150   GLUCOSE 147* 01/18/2015 1720   GLUCOSE 126* 12/26/2014 1150   BUN 44* 01/18/2015 1720   BUN 33* 12/26/2014 1150   CREATININE 8.11* 01/18/2015 1720   CREATININE 6.50* 12/26/2014 1150   CALCIUM 8.3* 01/18/2015 1720   CALCIUM 8.5* 12/26/2014 1150   GFRNONAA 7* 01/18/2015 1720   GFRNONAA 9* 12/26/2014 1150   GFRAA 8* 01/18/2015 1720   GFRAA 10* 12/26/2014 1150    Assessment/Planning:   Catheter-related sepsis he is now status post removal of the  infectious source. He had a temporary catheter placed emergently last night which is functioning well and is undergoing 2 runs of dialysis.  Mental status changes these are of a profound nature initially it was hoped this was purely uremic however given his successful dialysis and the continued mental status depression other sources must be considered he remains critically ill  [name], [name]  01/18/2015, 6:59 PM

## 2015-01-18 NOTE — Progress Notes (Signed)
This note also relates to the following rows which could not be included: Resp - Cannot attach notes to unvalidated device data   HD tx completed 

## 2015-01-18 NOTE — Progress Notes (Signed)
Received patient from floor s/p rapid response due to Hypertensive and lethargic.   Patient became hypotensive, Levophed started to maintain MAP of 65.  Emergent dialysis catheter inserted per Dr. Gilda CreaseSchnier in left groin for emergent dialysis. Has been mostly lethargic since arrival to ICU, but more alert since dialysis done.  Tolerated dialysis well.  Continue to titrate levophed for MAP of 65.

## 2015-01-18 NOTE — Progress Notes (Signed)
Pre-hd tx 

## 2015-01-18 NOTE — Progress Notes (Signed)
This note also relates to the following rows which could not be included: Pulse Rate - Cannot attach notes to unvalidated device data Resp - Cannot attach notes to unvalidated device data SpO2 - Cannot attach notes to unvalidated device data   Post hd tx 

## 2015-01-19 ENCOUNTER — Inpatient Hospital Stay: Payer: Medicare Other

## 2015-01-19 DIAGNOSIS — G9341 Metabolic encephalopathy: Secondary | ICD-10-CM

## 2015-01-19 LAB — CULTURE, BLOOD (ROUTINE X 2)
Culture: NO GROWTH
Culture: NO GROWTH

## 2015-01-19 LAB — CBC
HCT: 31 % — ABNORMAL LOW (ref 40.0–52.0)
Hemoglobin: 9.5 g/dL — ABNORMAL LOW (ref 13.0–18.0)
MCH: 26.8 pg (ref 26.0–34.0)
MCHC: 30.6 g/dL — AB (ref 32.0–36.0)
MCV: 87.7 fL (ref 80.0–100.0)
Platelets: 307 10*3/uL (ref 150–440)
RBC: 3.54 MIL/uL — ABNORMAL LOW (ref 4.40–5.90)
RDW: 19 % — AB (ref 11.5–14.5)
WBC: 38.4 10*3/uL — ABNORMAL HIGH (ref 3.8–10.6)

## 2015-01-19 LAB — GLUCOSE, CAPILLARY
Glucose-Capillary: 113 mg/dL — ABNORMAL HIGH (ref 70–99)
Glucose-Capillary: 146 mg/dL — ABNORMAL HIGH (ref 70–99)

## 2015-01-19 LAB — MAGNESIUM: Magnesium: 2.3 mg/dL (ref 1.7–2.4)

## 2015-01-19 MED ORDER — NEPRO/CARBSTEADY PO LIQD
1000.0000 mL | ORAL | Status: DC
  Administered 2015-01-20 (×2): 1000 mL

## 2015-01-19 NOTE — Progress Notes (Addendum)
Nutrition Follow-up  INTERVENTION:  EN: Spoke with MD, Kolluru regarding nutrition poc; MD wanting initiation of nutrition support via NGtube. Agreeable to recommended Nepro 1.8 continuous with a goal rate of 59m/hr to provide 2246kcals and 101g protein (100% of estimated energy and protein needs) with free water flushes of 171mq4hr. Will start nutrition support at 2043mr and increase if tolerating on follow-up. Will also recommend checking Mg today with initiation of TF. Will monitor electrolytes and UOP and make adjustments accordingly.  NUTRITION DIAGNOSIS:  Inadequate oral intake related to lethargy/confusion as evidenced by estimated needs, meal completion < 25% but being addressed with initiation of TF.  GOAL:  Goal for pt to tolerate initiation of TF with goal met within 24-48hours  MONITOR: EN Electrolyte and Renal Profile Glucose Profile Digestive System Anthropometrics UOP  REASON FOR ASSESSMENT:  Consult Enteral/tube feeding initiation and management  ASSESSMENT:  Pt with metabolic encephalopathy unable to swallow safely.  HD for the past 2 days, no UF. Pt now NPO.  Medications: levophed, PRotonix, Flagyl, phoslyra, MVI, colace, pepcid  LAbs: Electrolyte and Renal Profile:    Recent Labs Lab 01/17/15 0037 01/17/15 2250 01/18/15 1720  BUN 60* 67* 44*  CREATININE 11.33* 12.03* 8.11*  NA 135 135 133*  K 4.5 4.9 4.5  PHOS 7.3* 7.8* 6.1*   Glucose Profile:  Recent Labs  01/18/15 1810 01/19/15 0001 01/19/15 0555  GLUCAP 134* 113* 146*     Height:  Ht Readings from Last 1 Encounters:  02/06/2015 _0  (1.854 m)    Weight:  Wt Readings from Last 1 Encounters:  01/18/15 195 lb 8.8 oz (88.7 kg)    Wt Readings from Last 10 Encounters:  01/18/15 195 lb 8.8 oz (88.7 kg)    BMI:  Body mass index is 25.81 kg/(m^2).  Estimated Nutritional Needs:  Kcal:  2240-2647kcals, BEE: 1697kcals  Protein:  102-128g protein (1.0-1.2g/kg)  Fluid:   UOP+[id number] Order:  Diet NPO time specified  EDUCATION NEEDS:  Education needs no appropriate at this time   Intake/Output Summary (Last 24 hours) at 01/19/15 1057 Last data filed at 01/19/15 0500  Gross per 24 hour  Intake    200 ml  Output      1 ml  Net    199 ml    Last BM:  5/8 loose stool  HIGH Care Level  AllyDwyane Luo, LDN Pager [phone]

## 2015-01-19 NOTE — Progress Notes (Signed)
[facility]  ROUNDING NOTE   Subjective:   Still with altered mental status.  Hemodialysis for last two days. No UF.  Increasing leukocytosis.  Unable to swallow.  Norepinephrine gtt  Objective:  Vital signs in last 24 hours:  Temp:  [97.6 F (36.4 C)-98.5 F (36.9 C)] 98.5 F (36.9 C) (05/08 0700) Pulse Rate:  [85-122] 105 (05/08 0800) Resp:  [12-24] 23 (05/08 0800) BP: (83-134)/(53-77) 101/59 mmHg (05/08 0800) SpO2:  [91 %-100 %] 94 % (05/08 0800) Weight:  [88.7 kg (195 lb 8.8 oz)] 88.7 kg (195 lb 8.8 oz) (05/07 2015)  Weight change: -0.6 kg (-1 lb 5.2 oz) Filed Weights   01/18/15 0130 01/18/15 1700 01/18/15 2015  Weight: 88.7 kg (195 lb 8.8 oz) 88.7 kg (195 lb 8.8 oz) 88.7 kg (195 lb 8.8 oz)    Intake/Output: I/O last 3 completed shifts: In: 685.1 [I.V.:485.1; IV Piggyback:200] Out: 1 [Stool:1]   Intake/Output this shift:     Physical Exam: General: Critically ill, lethargic  Head: Normocephalic, atraumatic. Moist oral mucosal membranes  Eyes: Anicteric, PERRL, +icuterus  Neck: Supple, trachea midline  Lungs:  Diminished on right   Heart: Regular rate and rhythm  Abdomen:  Mild guarding  Extremities:  no peripheral edema.  Neurologic: Not oriented, lethargic.   Skin: No lesions  Access: Right femoral trialysis catheter 5/6    Basic Metabolic Panel:  Recent Labs Lab 01/15/15 0548 01/16/15 1110 01/17/15 0037 01/17/15 2250 01/18/15 1720  NA 138 134* 135 135 133*  K 4.2 4.2 4.5 4.9 4.5  CL 102 97* 98* 96* 96*  CO2 GLUCOSE 54* 95 122* 128* 147*  BUN 45* 55* 60* 67* 44*  CREATININE 10.14* 10.90* 11.33* 12.03* 8.11*  CALCIUM 8.3* 8.3* 8.5* 8.5* 8.3*  PHOS  --  6.8* 7.3* 7.8* 6.1*    Liver Function Tests:  Recent Labs Lab 01/16/15 1110 01/17/15 0037 01/17/15 2250 01/18/15 1720  AST  --   --  29  --   ALT  --   --  14*  --   ALKPHOS  --   --  82  --   BILITOT  --   --  1.2  --   PROT  --   --  6.4*  --    ALBUMIN 2.3* 2.3* 2.2*  2.2* 1.9*   No results for input(s): LIPASE, AMYLASE in the last 168 hours. No results for input(s): AMMONIA in the last 168 hours.  CBC:  Recent Labs Lab 01/15/15 0548 01/16/15 1110 01/17/15 0038 01/17/15 2250 01/18/15 1720  WBC 8.9 7.4 10.0 38.3*  36.6* 47.5*  HGB 8.3* 8.0* 8.5* 9.1*  9.1* 8.8*  HCT 26.7* 26.0* 28.1* 29.9*  30.1* 29.4*  MCV 88.2 88.6 89.2 88.0  87.6 87.7  PLT 195 198 231 308  337 298    Cardiac Enzymes: No results for input(s): CKTOTAL, CKMB, CKMBINDEX, TROPONINI in the last 168 hours.  BNP: Invalid input(s): POCBNP  CBG:  Recent Labs Lab 01/18/15 0756 01/18/15 1113 01/18/15 1810 01/19/15 0001 01/19/15 0555  GLUCAP 133* 128* 134* 113* 146*    Microbiology: Results for orders placed or performed during the hospital encounter of 01/22/2015  Culture, blood (routine x 2)     Status: None (Preliminary result)   Collection Time: 01/25/2015 12:57 PM  Result Value Ref Range Status   Specimen Description BLOOD  Final   Special Requests BLOOD  Final   Culture NO GROWTH 4 DAYS  Final   Report Status PENDING  Incomplete  Culture, blood (routine x 2)     Status: None (Preliminary result)   Collection Time: 01/25/2015 12:57 PM  Result Value Ref Range Status   Specimen Description BLOOD  Final   Special Requests BLOOD  Final   Culture NO GROWTH 4 DAYS  Final   Report Status PENDING  Incomplete  Cath Tip Culture     Status: None   Collection Time: 02/07/2015  5:57 PM  Result Value Ref Range Status   Specimen Description CATH TIP  Final   Special Requests NONE  Final   Culture NO GROWTH 3 DAYS  Final   Report Status 01/18/2015 FINAL  Final  C difficile quick scan w PCR reflex [facility])     Status: None   Collection Time: 01/15/15 11:02 AM  Result Value Ref Range Status   C Diff antigen POSITIVE  Final   C Diff toxin NEGATIVE  Final   C Diff interpretation   Final    Positive for toxigenic C. difficile, active toxin production  not detected. Patient has toxigenic C. difficile organisms present in the bowel, but toxin was not detected. The patient may be a carrier or the level of toxin in the sample was below the limit  of detection. This information should be used in conjunction with the patient's clinical history when deciding on possible therapy.     Comment: CRITICAL RESULT CALLED TO, READ BACK BY AND VERIFIED WITH: Zettie CooleyMARY WITHERSPOON @ 1400 01/15/15 BGB   Clostridium Difficile by PCR     Status: Abnormal   Collection Time: 01/15/15 11:02 AM  Result Value Ref Range Status   C difficile by pcr POSITIVE (A) NEGATIVE Final  Culture, blood (routine x 2)     Status: None (Preliminary result)   Collection Time: 01/17/15 11:03 PM  Result Value Ref Range Status   Specimen Description BLOOD  Final   Special Requests BLOOD  Final   Culture NO GROWTH < 12 HOURS  Final   Report Status PENDING  Incomplete  Culture, blood (routine x 2)     Status: None (Preliminary result)   Collection Time: 01/17/15 11:03 PM  Result Value Ref Range Status   Specimen Description BLOOD  Final   Special Requests BLOOD  Final   Culture NO GROWTH < 12 HOURS  Final   Report Status PENDING  Incomplete    Coagulation Studies: No results for input(s): LABPROT, INR in the last 72 hours.  Urinalysis: No results for input(s): COLORURINE, LABSPEC, PHURINE, GLUCOSEU, HGBUR, BILIRUBINUR, KETONESUR, PROTEINUR, UROBILINOGEN, NITRITE, LEUKOCYTESUR in the last 72 hours.  Invalid input(s): APPERANCEUR    Imaging: Ct Head Wo Contrast  01/18/2015   CLINICAL DATA:  Altered mental status. Onset of symptoms last night. Abnormal speech.  EXAM: CT HEAD WITHOUT CONTRAST  TECHNIQUE: Contiguous axial images were obtained from the base of the skull through the vertex without intravenous contrast.  COMPARISON:  None.  FINDINGS: No mass lesion, mass effect, midline shift, hydrocephalus, hemorrhage. No territorial ischemia or acute infarction. Extensive scalp vessel  atherosclerotic calcification. Mastoid air cells appear clear. Intracranial atherosclerosis. Paranasal sinuses are within normal limits. No aggressive osseous lesions.  IMPRESSION: Negative CT head.   Electronically Signed   By: Andreas NewportGeoffrey  Lamke M.D.   On: 01/18/2015 13:28   Ct Chest Wo Contrast  01/18/2015   CLINICAL DATA:  Onset of lethargy last night, RIGHT upper lobe consolidation/pneumonia, garbled speech, diabetes, end-stage renal disease with dialysis catheter  infection  EXAM: CT CHEST WITHOUT CONTRAST  TECHNIQUE: Multidetector CT imaging of the chest was performed following the standard protocol without IV contrast. Sagittal and coronal MPR images reconstructed from axial data set.  COMPARISON:  Chest radiograph 01/18/2015  FINDINGS: Extensive atherosclerotic calcifications coronary arteries less in aorta and proximal great vessels.  Mildly enlarged prevascular lymph node 12 mm short axis image 14, nonspecific in the setting of infiltrates.  No additional thoracic adenopathy identified, hilar assessment limited by lack of IV contrast.  Distended gallbladder with probable wall thickening and moderate pericholecystic infiltrative changes highly suggestive of acute cholecystitis, with infiltration extending into the RIGHT anterior pararenal space.  Remaining visualized portion of upper abdomen unremarkable.  Enlargement of cardiac chambers.  Small RIGHT pleural effusion.  Atelectasis at bases of RIGHT middle and BILATERAL lower lobes.  Consolidation RIGHT lower lobe and scattered in RIGHT upper lobe.  No pneumothorax.  Scattered degenerative changes of the thoracic spine.  IMPRESSION: Extensive atherosclerotic disease changes particularly atherosclerotic calcification within the coronary arteries.  Enlargement of cardiac silhouette.  Small RIGHT pleural effusion with bibasilar atelectasis as well as consolidation within RIGHT lower and RIGHT upper lobes.  Distended gallbladder with suspected gallbladder wall  thickening and moderate pericholecystic infiltrative changes highly suggestive of acute cholecystitis; recommend sonographic evaluation.   Electronically Signed   By: Ulyses SouthwardMark  Boles M.D.   On: 01/18/2015 13:40   Dg Chest Port 1 View  01/18/2015   CLINICAL DATA:  Acute encephalopathy  EXAM: PORTABLE CHEST - 1 VIEW  COMPARISON:  12/26/2014  FINDINGS: Opacity persists in the lateral right lung although it is decreasing compare to the prior studies. No new or worsening airspace opacities are evident. No effusions are evident. There is unchanged mild cardiomegaly. There is unchanged mild right hemidiaphragm elevation.  IMPRESSION: Focal airspace opacity in the lateral right lung may be infectious or inflammatory. It involves a smaller portion of the lung compared to the prior studies of 12/26/2014 and 11/13/2014. The serial changes over these recent radiographs argues against a neoplastic process.   Electronically Signed   By: Ellery Plunkaniel R Mitchell M.D.   On: 01/18/2015 03:06     Medications:   . norepinephrine (LEVOPHED) Adult infusion 10 mcg/min (01/19/15 0800)   . calcium acetate (Phos Binder)  1,334 mg Oral TID WC  . docusate sodium  100 mg Oral BID  . DULoxetine  60 mg Oral Daily  . ezetimibe  10 mg Oral Daily  . famotidine  10 mg Oral BID  . feeding supplement (NEPRO CARB STEADY)  237 mL Oral BID BM  . fluticasone  1 spray Each Nare Daily  . gabapentin  300 mg Oral QID  . lactulose  10 g Oral TID  . levofloxacin (LEVAQUIN) IV  500 mg Intravenous Q48H  . levothyroxine  125 mcg Oral QAC breakfast  . metronidazole  500 mg Intravenous Q8H  . montelukast  10 mg Oral QHS  . multivitamin  1 tablet Oral Daily  . oxybutynin  5 mg Oral BID  . pantoprazole  40 mg Oral Daily  . polyethylene glycol  17 g Oral Daily  . tiotropium  18 mcg Inhalation Daily   acetaminophen **OR** acetaminophen, alum & mag hydroxide-simeth, hydrALAZINE, hydrOXYzine, ondansetron **OR** ondansetron (ZOFRAN) IV, oxyCODONE,  senna-docusate, vancomycin  Assessment/ Plan:  59 y.o. black male  With asthma, diabetes mellitus type II, diabetic retinopathy, hypertension, gout, hyperlipidemia, and morbid obesity and ESRD   [facility] TTS first shift.   1.  End stage renal disease: with infected catheter. Now removed.  Hemodialysis treatment that last two days for uremic encephalopathy. However still with mental status changes  Will need tunnelled catheter eventually. Cultures from [facility] show 5/3 Kebsiella: placed on levofoxacin  2. Metabolic Encephalopathy:G93.41  improved some with dialysis. Uremic encephalopathy but may have had an ischemic event. CT head negative.  - Unable to take PO medications: will place NGT.  - switch abx: metronidazole to  3. Right upper lobe  acute pneumonia with sepsis: J18.9. I45.9 - cultures negative.  - empiric antibiotics. Levofloxacin, vancomycin and metronidazole.  - norepinephrine gtt  4. Anemia of chronic kidney disease: hemoglobin of 8.8 - epo scheduled for later today. Last epo on Tuesday 5/3. EPO was held due to fear of ischemic event.   6. Secondary Hyperparathyroidism: PTH elevated as outpatient at 1120, phos at goal at 4.9 -continue calcium acetate liquid for binding when back on diet.  - NGT tube to be placed. Will consult RD for nutrition. Will need tube feeds.   7. Malnutrition/Cachexia: Z61. Albumin dropped to 1.9 . Not eating well. And now NPO  - May need NGT with tube feeds. .     LOS: 5 Judy Goodenow 5/8/20169:15 AM

## 2015-01-19 NOTE — Progress Notes (Signed)
[facility]  Daily Progress Note   Subjective  -  Patient remains obtunded without evidence of improvement in his mental status  Objective Filed Vitals:   01/19/15 0800 01/19/15 0900 01/19/15 1000 01/19/15 1100  BP: 101/59 106/60  106/66  Pulse: 105 106 104 106  Temp:    98.4 F (36.9 C)  TempSrc:    Oral  Resp: 23 15 21 18   Height:      Weight:      SpO2: 94% 94% 96% 97%    Intake/Output Summary (Last 24 hours) at 01/19/15 1412 Last data filed at 01/19/15 0500  Gross per 24 hour  Intake    200 ml  Output      1 ml  Net    199 ml    PULM  Normal effort , no use of accessory muscles CV  No JVD, RRR Abd      No distended, nontender VASC  right groin intact no drainage or purulence, left femoral catheter clean dry and intact  Laboratory CBC    Component Value Date/Time   WBC 38.4* 01/19/2015 0915   WBC 9.6 12/26/2014 1150   HGB 9.5* 01/19/2015 0915   HGB 10.1* 12/26/2014 1150   HCT 31.0* 01/19/2015 0915   HCT 32.3* 12/26/2014 1150   PLT 307 01/19/2015 0915   PLT 144* 12/26/2014 1150    BMET    Component Value Date/Time   NA 133* 01/18/2015 1720   NA 136 12/26/2014 1150   K 4.5 01/18/2015 1720   K 4.0 12/26/2014 1150   CL 96* 01/18/2015 1720   CL 99* 12/26/2014 1150   CO2 27 01/18/2015 1720   CO2 30 12/26/2014 1150   GLUCOSE 147* 01/18/2015 1720   GLUCOSE 126* 12/26/2014 1150   BUN 44* 01/18/2015 1720   BUN 33* 12/26/2014 1150   CREATININE 8.11* 01/18/2015 1720   CREATININE 6.50* 12/26/2014 1150   CALCIUM 8.3* 01/18/2015 1720   CALCIUM 8.5* 12/26/2014 1150   GFRNONAA 7* 01/18/2015 1720   GFRNONAA 9* 12/26/2014 1150   GFRAA 8* 01/18/2015 1720   GFRAA 10* 12/26/2014 1150    Assessment/Planning:   End-stage renal disease requiring hemodialysis: Currently patient has adequate access with the Right catheter given his overall guarded condition no indication for further evaluation regarding permanent access at this  time  Mental status changes: In spite of dialysis the patient does not appear to be improving neurological evaluation overall his prognosis remains poor    Sharyah Bostwick, Latina CraverGregory G  01/19/2015, 2:12 PM

## 2015-01-19 NOTE — Progress Notes (Signed)
[facility] [place] [facility]   PATIENT NAME: [name] ReddenJerry [name]    MR#:  [id number]  DATE OF BIRTH:  04/18/1956  SUBJECTIVE:   Still lethargic, difficult to understand.  S/p urgent dialysis yesterday but mental status has not improved.  Follows some simple commands.    REVIEW OF SYSTEMS:   Review of Systems  Constitutional: Negative for fever and chills.  HENT: Negative for congestion, sore throat and tinnitus.   Eyes: Negative for blurred vision and double vision.  Respiratory: Negative for cough, shortness of breath and wheezing.   Cardiovascular: Negative for chest pain, orthopnea and PND.  Gastrointestinal: Negative for nausea, vomiting, abdominal pain, diarrhea and melena.  Genitourinary: Negative for dysuria and hematuria.   Nutrition: No Tolerating PT: No Tolerating diet: No due to encephalopathy.   DRUG ALLERGIES:   Allergies  Allergen Reactions  . Penicillins Hives  . Penicillin G Other (See Comments)    VITALS:  Blood pressure 106/66, pulse 106, temperature 98.4 F (36.9 C), temperature source Oral, resp. rate 18, height 6\' 1"  (1.854 m), weight 88.7 kg (195 lb 8.8 oz), SpO2 97 %.  PHYSICAL EXAMINATION:  GENERAL:  59 y.o.-year-old patient lying in the bed with no acute distress.  EYES: Pupils equal, round, reactive to light and accommodation. No scleral icterus. Extraocular muscles intact.  HEENT: Head atraumatic, normocephalic. Dry Oral Mucosa. NG tube in place.  NECK:  Supple, no jugular venous distention. No thyroid enlargement, no tenderness.  LUNGS: Normal breath sounds bilaterally, no wheezing, rales,rhonchi or crepitation. No use of accessory muscles of respiration.  CARDIOVASCULAR: S1, S2 normal. No murmurs, rubs, or gallops.  ABDOMEN: Soft, nontender, nondistended. Bowel sounds present. No organomegaly or mass.  EXTREMITIES: No pedal edema, cyanosis, or clubbing.  NEUROLOGIC: remains lethargic, encephalopathic, follows simple  commands. Slurred speech. PSYCHIATRIC: Encephalopathic but alert.  SKIN: No obvious rash, lesion, or ulcer.    LABORATORY PANEL:   CBC  Recent Labs Lab 01/18/15 1720 01/19/15 0915  WBC 47.5* 38.4*  HGB 8.8* 9.5*  HCT 29.4* 31.0*  PLT 298 307   ------------------------------------------------------------------------------------------------------------------  Chemistries   Recent Labs Lab 01/17/15 2250 01/18/15 1720  NA 135 133*  K 4.9 4.5  CL 96* 96*  CO2 25 27  GLUCOSE 128* 147*  BUN 67* 44*  CREATININE 12.03* 8.11*  CALCIUM 8.5* 8.3*  AST 29  --   ALT 14*  --   ALKPHOS 82  --   BILITOT 1.2  --    ------------------------------------------------------------------------------------------------------------------  Cardiac Enzymes No results for input(s): TROPONINI in the last 168 hours. ------------------------------------------------------------------------------------------------------------------  RADIOLOGY:  Ct Head Wo Contrast  01/18/2015   CLINICAL DATA:  Altered mental status. Onset of symptoms last night. Abnormal speech.  EXAM: CT HEAD WITHOUT CONTRAST  TECHNIQUE: Contiguous axial images were obtained from the base of the skull through the vertex without intravenous contrast.  COMPARISON:  None.  FINDINGS: No mass lesion, mass effect, midline shift, hydrocephalus, hemorrhage. No territorial ischemia or acute infarction. Extensive scalp vessel atherosclerotic calcification. Mastoid air cells appear clear. Intracranial atherosclerosis. Paranasal sinuses are within normal limits. No aggressive osseous lesions.  IMPRESSION: Negative CT head.   Electronically Signed   By: Andreas NewportGeoffrey  Lamke M.D.   On: 01/18/2015 13:28   Ct Chest Wo Contrast  01/18/2015   CLINICAL DATA:  Onset of lethargy last night, RIGHT upper lobe consolidation/pneumonia, garbled speech, diabetes, end-stage renal disease with dialysis catheter infection  EXAM: CT CHEST WITHOUT CONTRAST  TECHNIQUE:  Multidetector  CT imaging of the chest was performed following the standard protocol without IV contrast. Sagittal and coronal MPR images reconstructed from axial data set.  COMPARISON:  Chest radiograph 01/18/2015  FINDINGS: Extensive atherosclerotic calcifications coronary arteries less in aorta and proximal great vessels.  Mildly enlarged prevascular lymph node 12 mm short axis image 14, nonspecific in the setting of infiltrates.  No additional thoracic adenopathy identified, hilar assessment limited by lack of IV contrast.  Distended gallbladder with probable wall thickening and moderate pericholecystic infiltrative changes highly suggestive of acute cholecystitis, with infiltration extending into the RIGHT anterior pararenal space.  Remaining visualized portion of upper abdomen unremarkable.  Enlargement of cardiac chambers.  Small RIGHT pleural effusion.  Atelectasis at bases of RIGHT middle and BILATERAL lower lobes.  Consolidation RIGHT lower lobe and scattered in RIGHT upper lobe.  No pneumothorax.  Scattered degenerative changes of the thoracic spine.  IMPRESSION: Extensive atherosclerotic disease changes particularly atherosclerotic calcification within the coronary arteries.  Enlargement of cardiac silhouette.  Small RIGHT pleural effusion with bibasilar atelectasis as well as consolidation within RIGHT lower and RIGHT upper lobes.  Distended gallbladder with suspected gallbladder wall thickening and moderate pericholecystic infiltrative changes highly suggestive of acute cholecystitis; recommend sonographic evaluation.   Electronically Signed   By: Ulyses SouthwardMark  Boles M.D.   On: 01/18/2015 13:40   Dg Chest Port 1 View  01/18/2015   CLINICAL DATA:  Acute encephalopathy  EXAM: PORTABLE CHEST - 1 VIEW  COMPARISON:  12/26/2014  FINDINGS: Opacity persists in the lateral right lung although it is decreasing compare to the prior studies. No new or worsening airspace opacities are evident. No effusions are evident.  There is unchanged mild cardiomegaly. There is unchanged mild right hemidiaphragm elevation.  IMPRESSION: Focal airspace opacity in the lateral right lung may be infectious or inflammatory. It involves a smaller portion of the lung compared to the prior studies of 12/26/2014 and 11/13/2014. The serial changes over these recent radiographs argues against a neoplastic process.   Electronically Signed   By: Ellery Plunkaniel R Mitchell M.D.   On: 01/18/2015 03:06   Dg Abd Portable 1v  01/19/2015   CLINICAL DATA:  Nasogastric tube placed.  EXAM: PORTABLE ABDOMEN - 1 VIEW  COMPARISON:  12/24/2014.  FINDINGS: Interval nasogastric tube with its tip in the proximal stomach. Mild gaseous distention of the stomach and mid abdominal bowel loops. Breathing motion blurring. Left femoral catheter tip in the inferior aspect of the inferior vena cava. Lumbar and lower thoracic spine degenerative changes and scoliosis.  IMPRESSION: 1. Nasogastric tube tip in the proximal stomach. 2. Mild gastric and bowel distention, possibly due to partial small bowel obstruction.   Electronically Signed   By: Beckie SaltsSteven  Reid M.D.   On: 01/19/2015 11:33     ASSESSMENT AND PLAN:   * Dialysis access site infection - BC at outpatient dialysis were + & growing Klebsiella.   - s/p Perm Cath removal by Dr schnier on 01/28/2015 - cont. Levaquin for Klebsiella for now.  - BC from 02/04/2015 & Cath tip cx 01/22/2015 neg so far - s/p temp. Cath placement.   * End-stage renal disease on hemodialysis. - Nephro following and cont. HD as per them.  - had HD for the past 2 days.   * Sepsis - likely due to pneumonia as seen on CT chest/dialysis access infection.   - afebrile, but remains on levophed.  Keep MAP > 55.  - cont. Empiric abx w/ Vanco, Levaquin, Flagyl.  - follow cultures  which are (-) so far.   * AMS/Encephalopathy - thought to be related to uremia but pt. Is s/p HD X 2 days and mental status not much improved.  - CT head (-).  Does have a  leukocytosis and CT chest suggestive of pneumonia.  ?? Metabolic encephalopathy from sepsis.  - cont. IV Vanc, Levaquin, Flagyl and follow clinically.  - await Neuro eval.   * Leukocytosis - ?? Related to pneumonia/sepsis.  - follow WBC count w/ IV abx therapy. Trending down.   * Diabetes type II w/out complication.  - BS stable now but pt. Not eating much.  - hold off po glipizide and Insulin  * Clostridium difficile colitis - cont. po flagyl. No diarrhea.   * Essential Hypertension. - bp on the lower side - hold bp meds  * DM Neuropathy - cont. Neurontin.   * COPD - no acute exacerbation.  - cont. Spiriva.   * Depression - cont. Cymbalta  * Nutrition - s/p NG tube today as remains encephalopathic. Will start tube feeding.   - consider speech eval in a.m. If mental status improved.   CODE STATUS: Full code.    All the records are reviewed and case discussed with Care Management/Social Workerr. Management plans discussed with the patient, family and they are in agreement.  CODE STATUS: full code  TOTAL Critical care TIME TAKING CARE OF THIS PATIENT: 30 mins   Kit Mollett J M.D on 01/19/2015 at 12:24 PM  Between 7am to 6pm - Pager - [phone]  After 6pm go to [email or web address] - password [email or web address] [facility]  [facility]  Office  [phone]  CC: Primary care physician; No primary care provider on file.

## 2015-01-19 NOTE — Progress Notes (Signed)
Per Dr. Cherlynn KaiserSainani: may use NG tube

## 2015-01-19 NOTE — Consult Note (Signed)
CC: altered mental status  HPI: [name] is an 59 y.o. male with a known history of end-stage renal disease on hemodialysis. Was seen at [facility] by Dr. Thedore MinsSingh where he was noted to have pus around his right PermCath. Two sets of blood cultures were drawn and was given IV vancomycin at dialysis and was requested to come to the hospital for removal of the permanent catheter and antibiotic treatment instead of him going to the vascular lab at the Emergency Department.   Pt is s/p femoral cath placement and s/p emergent dialysis.   Past Medical History  Diagnosis Date  . Diabetes   . End stage renal disease on dialysis   . Anemia     History reviewed. No pertinent past surgical history.  History reviewed. No pertinent family history.  Social History:  reports that he has never smoked. He does not have any smokeless tobacco history on file. He reports that he does not drink alcohol or use illicit drugs.  Allergies  Allergen Reactions  . Penicillins Hives  . Penicillin G Other (See Comments)    Medications: I have reviewed the patient's current medications.  ROS: Unable to obtain due mental status  Physical Examination: Blood pressure 106/66, pulse 106, temperature 98.4 F (36.9 C), temperature source Oral, resp. rate 18, height 6\' 1"  (1.854 m), weight 88.7 kg (195 lb 8.8 oz), SpO2 97 %.   Neurological Examination Mental Status: Able to tell me only his name, does not know location. Dysarthric speech.   Cranial Nerves: II: Discs flat bilaterally; Visual fields grossly normal, pupils equal, round, reactive to light and accommodation III,IV, VI: ptosis not present, extra-ocular motions intact bilaterally V,VII: smile symmetric, facial light touch sensation normal bilaterally VIII: hearing normal bilaterally IX,X: gag reflex present XI: bilateral shoulder shrug XII: midline tongue extension Motor: 4+/5 b/l upper and lower extremity.   Tone and bulk:normal tone  throughout; no atrophy noted Sensory: Pinprick and light touch intact throughout, bilaterally Deep Tendon Reflexes: 2+ and symmetric throughout Plantars: Right: downgoing   Left: downgoing Cerebellar: normal finger-to-nose,  Gait: not examined.       Laboratory Studies:   Basic Metabolic Panel:  Recent Labs Lab 01/15/15 0548 01/16/15 1110 01/17/15 0037 01/17/15 2250 01/18/15 1720 01/19/15 0915  NA 138 134* 135 135 133*  --   K 4.2 4.2 4.5 4.9 4.5  --   CL 102 97* 98* 96* 96*  --   CO2 23 24 24 25 27   --   GLUCOSE 54* 95 122* 128* 147*  --   BUN 45* 55* 60* 67* 44*  --   CREATININE 10.14* 10.90* 11.33* 12.03* 8.11*  --   CALCIUM 8.3* 8.3* 8.5* 8.5* 8.3*  --   MG  --   --   --   --   --  2.3  PHOS  --  6.8* 7.3* 7.8* 6.1*  --     Liver Function Tests:  Recent Labs Lab 01/16/15 1110 01/17/15 0037 01/17/15 2250 01/18/15 1720  AST  --   --  29  --   ALT  --   --  14*  --   ALKPHOS  --   --  82  --   BILITOT  --   --  1.2  --   PROT  --   --  6.4*  --   ALBUMIN 2.3* 2.3* 2.2*  2.2* 1.9*   No results for input(s): LIPASE, AMYLASE in the last 168 hours. No results  for input(s): AMMONIA in the last 168 hours.  CBC:  Recent Labs Lab 01/16/15 1110 01/17/15 0038 01/17/15 2250 01/18/15 1720 01/19/15 0915  WBC 7.4 10.0 38.3*  36.6* 47.5* 38.4*  HGB 8.0* 8.5* 9.1*  9.1* 8.8* 9.5*  HCT 26.0* 28.1* 29.9*  30.1* 29.4* 31.0*  MCV 88.6 89.2 88.0  87.6 87.7 87.7  PLT 198 231 308  337 298 307    Cardiac Enzymes: No results for input(s): CKTOTAL, CKMB, CKMBINDEX, TROPONINI in the last 168 hours.  BNP: Invalid input(s): POCBNP  CBG:  Recent Labs Lab 01/18/15 0756 01/18/15 1113 01/18/15 1810 01/19/15 0001 01/19/15 0555  GLUCAP 133* 128* 134* 113* 146*    Microbiology: Results for orders placed or performed during the hospital encounter of 01/17/2015  Culture, blood (routine x 2)     Status: None   Collection Time: 01/21/2015 12:57 PM  Result Value  Ref Range Status   Specimen Description BLOOD  Final   Special Requests BLOOD  Final   Culture NO GROWTH 5 DAYS  Final   Report Status 01/19/2015 FINAL  Final  Culture, blood (routine x 2)     Status: None   Collection Time: 01/18/2015 12:57 PM  Result Value Ref Range Status   Specimen Description BLOOD  Final   Special Requests BLOOD  Final   Culture NO GROWTH 5 DAYS  Final   Report Status 01/19/2015 FINAL  Final  Cath Tip Culture     Status: None   Collection Time: 01/26/2015  5:57 PM  Result Value Ref Range Status   Specimen Description CATH TIP  Final   Special Requests NONE  Final   Culture NO GROWTH 3 DAYS  Final   Report Status 01/18/2015 FINAL  Final  C difficile quick scan w PCR reflex [facility])     Status: None   Collection Time: 01/15/15 11:02 AM  Result Value Ref Range Status   C Diff antigen POSITIVE  Final   C Diff toxin NEGATIVE  Final   C Diff interpretation   Final    Positive for toxigenic C. difficile, active toxin production not detected. Patient has toxigenic C. difficile organisms present in the bowel, but toxin was not detected. The patient may be a carrier or the level of toxin in the sample was below the limit  of detection. This information should be used in conjunction with the patient's clinical history when deciding on possible therapy.     Comment: CRITICAL RESULT CALLED TO, READ BACK BY AND VERIFIED WITH: Zettie Cooley @ 1400 01/15/15 BGB   Clostridium Difficile by PCR     Status: Abnormal   Collection Time: 01/15/15 11:02 AM  Result Value Ref Range Status   C difficile by pcr POSITIVE (A) NEGATIVE Final  Culture, blood (routine x 2)     Status: None (Preliminary result)   Collection Time: 01/17/15 11:03 PM  Result Value Ref Range Status   Specimen Description BLOOD  Final   Special Requests BLOOD  Final   Culture NO GROWTH 2 DAYS  Final   Report Status PENDING  Incomplete  Culture, blood (routine x 2)     Status: None (Preliminary result)    Collection Time: 01/17/15 11:03 PM  Result Value Ref Range Status   Specimen Description BLOOD  Final   Special Requests BLOOD  Final   Culture NO GROWTH 2 DAYS  Final   Report Status PENDING  Incomplete    Coagulation Studies: No results for input(s): LABPROT, INR  in the last 72 hours.  Urinalysis: No results for input(s): COLORURINE, LABSPEC, PHURINE, GLUCOSEU, HGBUR, BILIRUBINUR, KETONESUR, PROTEINUR, UROBILINOGEN, NITRITE, LEUKOCYTESUR in the last 168 hours.  Invalid input(s): APPERANCEUR  Lipid Panel:  No results found for: CHOL, TRIG, HDL, CHOLHDL, VLDL, LDLCALC  HgbA1C: No results found for: HGBA1C  Urine Drug Screen:  No results found for: LABOPIA, COCAINSCRNUR, LABBENZ, AMPHETMU, THCU, LABBARB  Alcohol Level: No results for input(s): ETH in the last 168 hours.  Other results: EKG: normal EKG, normal sinus rhythm, unchanged from previous tracings.  Imaging: Ct Head Wo Contrast  01/18/2015   CLINICAL DATA:  Altered mental status. Onset of symptoms last night. Abnormal speech.  EXAM: CT HEAD WITHOUT CONTRAST  TECHNIQUE: Contiguous axial images were obtained from the base of the skull through the vertex without intravenous contrast.  COMPARISON:  None.  FINDINGS: No mass lesion, mass effect, midline shift, hydrocephalus, hemorrhage. No territorial ischemia or acute infarction. Extensive scalp vessel atherosclerotic calcification. Mastoid air cells appear clear. Intracranial atherosclerosis. Paranasal sinuses are within normal limits. No aggressive osseous lesions.  IMPRESSION: Negative CT head.   Electronically Signed   By: Andreas Newport M.D.   On: 01/18/2015 13:28   Ct Chest Wo Contrast  01/18/2015   CLINICAL DATA:  Onset of lethargy last night, RIGHT upper lobe consolidation/pneumonia, garbled speech, diabetes, end-stage renal disease with dialysis catheter infection  EXAM: CT CHEST WITHOUT CONTRAST  TECHNIQUE: Multidetector CT imaging of the chest was performed following the  standard protocol without IV contrast. Sagittal and coronal MPR images reconstructed from axial data set.  COMPARISON:  Chest radiograph 01/18/2015  FINDINGS: Extensive atherosclerotic calcifications coronary arteries less in aorta and proximal great vessels.  Mildly enlarged prevascular lymph node 12 mm short axis image 14, nonspecific in the setting of infiltrates.  No additional thoracic adenopathy identified, hilar assessment limited by lack of IV contrast.  Distended gallbladder with probable wall thickening and moderate pericholecystic infiltrative changes highly suggestive of acute cholecystitis, with infiltration extending into the RIGHT anterior pararenal space.  Remaining visualized portion of upper abdomen unremarkable.  Enlargement of cardiac chambers.  Small RIGHT pleural effusion.  Atelectasis at bases of RIGHT middle and BILATERAL lower lobes.  Consolidation RIGHT lower lobe and scattered in RIGHT upper lobe.  No pneumothorax.  Scattered degenerative changes of the thoracic spine.  IMPRESSION: Extensive atherosclerotic disease changes particularly atherosclerotic calcification within the coronary arteries.  Enlargement of cardiac silhouette.  Small RIGHT pleural effusion with bibasilar atelectasis as well as consolidation within RIGHT lower and RIGHT upper lobes.  Distended gallbladder with suspected gallbladder wall thickening and moderate pericholecystic infiltrative changes highly suggestive of acute cholecystitis; recommend sonographic evaluation.   Electronically Signed   By: Ulyses Southward M.D.   On: 01/18/2015 13:40   Dg Chest Port 1 View  01/18/2015   CLINICAL DATA:  Acute encephalopathy  EXAM: PORTABLE CHEST - 1 VIEW  COMPARISON:  12/26/2014  FINDINGS: Opacity persists in the lateral right lung although it is decreasing compare to the prior studies. No new or worsening airspace opacities are evident. No effusions are evident. There is unchanged mild cardiomegaly. There is unchanged mild right  hemidiaphragm elevation.  IMPRESSION: Focal airspace opacity in the lateral right lung may be infectious or inflammatory. It involves a smaller portion of the lung compared to the prior studies of 12/26/2014 and 11/13/2014. The serial changes over these recent radiographs argues against a neoplastic process.   Electronically Signed   By: Rosey Bath.D.  On: 01/18/2015 03:06   Dg Abd Portable 1v  01/19/2015   CLINICAL DATA:  Nasogastric tube placed.  EXAM: PORTABLE ABDOMEN - 1 VIEW  COMPARISON:  12/24/2014.  FINDINGS: Interval nasogastric tube with its tip in the proximal stomach. Mild gaseous distention of the stomach and mid abdominal bowel loops. Breathing motion blurring. Left femoral catheter tip in the inferior aspect of the inferior vena cava. Lumbar and lower thoracic spine degenerative changes and scoliosis.  IMPRESSION: 1. Nasogastric tube tip in the proximal stomach. 2. Mild gastric and bowel distention, possibly due to partial small bowel obstruction.   Electronically Signed   By: Beckie SaltsSteven  Reid M.D.   On: 01/19/2015 11:33     Assessment/Plan: 59 y/o male admitted with dialysis cath infection.   Mental status improving but not at baseline. He is able to follow my commands but can only verbalize his name but not location.    I think his mental status is likely multifactorial likely due to C diff infection, possible PNA, and improving uremia.   He is following my commands and I don't see any focal findings.   CTH reviewed no acute abnormalities Would not pursue and further imaging at this point. I don't think there is any need for EEG  01/19/2015, 12:43 PM

## 2015-01-19 NOTE — Progress Notes (Signed)
Pt continues to awaken to when he is spoken to. Pt follows commands. Pt does speak but some of what he says is incomprehensible. Pt continues on Levophed for BP. Pt now has NG placed and will have tube feeds started. Pt has not voided this shift.

## 2015-01-19 NOTE — Progress Notes (Signed)
ANTIBIOTIC CONSULT NOTE - FOLLOW UP  Pharmacy Consult for Vancomycin Indication: Dialysis Catheter Infection   Allergies  Allergen Reactions  . Penicillins Hives  . Penicillin G Other (See Comments)    Patient Measurements: Height: 6\' 1"  (185.4 cm) Weight: 195 lb 8.8 oz (88.7 kg) IBW/kg (Calculated) : 79.9  Vital Signs: Temp: 98.4 F (36.9 C) (05/08 1100) Temp Source: Oral (05/08 1100) BP: 106/66 mmHg (05/08 1100) Pulse Rate: 106 (05/08 1100) Intake/Output from previous day: 05/07 0701 - 05/08 0700 In: 200 [IV Piggyback:200] Out: 1 [Stool:1] Intake/Output from this shift:    Labs:  Recent Labs  01/17/15 0037  01/17/15 2250 01/18/15 1720 01/19/15 0915  WBC  --   < > 38.3*  36.6* 47.5* 38.4*  HGB  --   < > 9.1*  9.1* 8.8* 9.5*  PLT  --   < > 308  337 298 307  CREATININE 11.33*  --  12.03* 8.11*  --   < > = values in this interval not displayed. Estimated Creatinine Clearance: 11.4 mL/min (by C-G formula based on Cr of 8.11). No results for input(s): VANCOTROUGH, VANCOPEAK, VANCORANDOM, GENTTROUGH, GENTPEAK, GENTRANDOM, TOBRATROUGH, TOBRAPEAK, TOBRARND, AMIKACINPEAK, AMIKACINTROU, AMIKACIN in the last 72 hours.   Microbiology: Recent Results (from the past 720 hour(s))  Culture, blood (single)     Status: None   Collection Time: 12/21/14 10:27 AM  Result Value Ref Range Status   Micro Text Report   Final       SOURCE: #1 RIGHT ARM    COMMENT                   NO GROWTH AEROBICALLY/ANAEROBICALLY IN 5 DAYS   ANTIBIOTIC                                                      Culture, blood (single)     Status: None   Collection Time: 12/21/14 11:21 AM  Result Value Ref Range Status   Micro Text Report   Final       COMMENT                   NO GROWTH AEROBICALLY/ANAEROBICALLY IN 5 DAYS   ANTIBIOTIC                                                      Culture, blood (routine x 2)     Status: None   Collection Time: 01/12/2015 12:57 PM  Result Value Ref  Range Status   Specimen Description BLOOD  Final   Special Requests BLOOD  Final   Culture NO GROWTH 5 DAYS  Final   Report Status 01/19/2015 FINAL  Final  Culture, blood (routine x 2)     Status: None   Collection Time: 01/30/2015 12:57 PM  Result Value Ref Range Status   Specimen Description BLOOD  Final   Special Requests BLOOD  Final   Culture NO GROWTH 5 DAYS  Final   Report Status 01/19/2015 FINAL  Final  Cath Tip Culture     Status: None   Collection Time: 01/18/2015  5:57 PM  Result Value Ref Range Status   Specimen  Description CATH TIP  Final   Special Requests NONE  Final   Culture NO GROWTH 3 DAYS  Final   Report Status 01/18/2015 FINAL  Final  C difficile quick scan w PCR reflex [facility])     Status: None   Collection Time: 01/15/15 11:02 AM  Result Value Ref Range Status   C Diff antigen POSITIVE  Final   C Diff toxin NEGATIVE  Final   C Diff interpretation   Final    Positive for toxigenic C. difficile, active toxin production not detected. Patient has toxigenic C. difficile organisms present in the bowel, but toxin was not detected. The patient may be a carrier or the level of toxin in the sample was below the limit  of detection. This information should be used in conjunction with the patient's clinical history when deciding on possible therapy.     Comment: CRITICAL RESULT CALLED TO, READ BACK BY AND VERIFIED WITH: Zettie CooleyMARY WITHERSPOON @ 1400 01/15/15 BGB   Clostridium Difficile by PCR     Status: Abnormal   Collection Time: 01/15/15 11:02 AM  Result Value Ref Range Status   C difficile by pcr POSITIVE (A) NEGATIVE Final  Culture, blood (routine x 2)     Status: None (Preliminary result)   Collection Time: 01/17/15 11:03 PM  Result Value Ref Range Status   Specimen Description BLOOD  Final   Special Requests BLOOD  Final   Culture NO GROWTH 2 DAYS  Final   Report Status PENDING  Incomplete  Culture, blood (routine x 2)     Status: None (Preliminary result)   Collection  Time: 01/17/15 11:03 PM  Result Value Ref Range Status   Specimen Description BLOOD  Final   Special Requests BLOOD  Final   Culture NO GROWTH 2 DAYS  Final   Report Status PENDING  Incomplete    Anti-infectives    Start     Dose/Rate Route Frequency Ordered Stop   01/18/15 1515  levofloxacin (LEVAQUIN) IVPB 500 mg     500 mg 100 mL/hr over 60 Minutes Intravenous Every 48 hours 01/18/15 1505     01/18/15 1106  vancomycin (VANCOCIN) IVPB 1000 mg/200 mL premix     1,000 mg 200 mL/hr over 60 Minutes Intravenous Every Dialysis 01/18/15 1115     01/18/15 1100  metroNIDAZOLE (FLAGYL) IVPB 500 mg     500 mg 100 mL/hr over 60 Minutes Intravenous Every 8 hours 01/18/15 1053     01/15/15 1445  metroNIDAZOLE (FLAGYL) tablet 500 mg  Status:  Discontinued     500 mg Oral 3 times per day 01/15/15 1436 01/18/15 1053   01/19/2015 1300  vancomycin (VANCOCIN) 2,000 mg in sodium chloride 0.9 % 500 mL IVPB     2,000 mg 250 mL/hr over 120 Minutes Intravenous  Once 01/16/2015 1249 01/19/2015 1844      Assessment: Patient received Vancomycin 1g post HD on 5/7.  Dialysis not scheduled for 5/8.    Goal of Therapy:  Goal trough for Dialysis = 15-25 mcg/ml.   Plan:  Establish Dialysis schedule.  Order Vancomycin and trough level appropriately.    Liandro Thelin K, RPh 01/19/2015,12:19 PM

## 2015-01-19 NOTE — Progress Notes (Signed)
Pt rested this evening. Post dialysis pt became more responsive with mumbling but eyes open and tracking. Pt. Had one large loose stool this shift. No other changes noted. Pt continues on levophed at 10 mcq.

## 2015-01-20 DIAGNOSIS — Z515 Encounter for palliative care: Secondary | ICD-10-CM

## 2015-01-20 DIAGNOSIS — Z79899 Other long term (current) drug therapy: Secondary | ICD-10-CM

## 2015-01-20 DIAGNOSIS — E119 Type 2 diabetes mellitus without complications: Secondary | ICD-10-CM

## 2015-01-20 DIAGNOSIS — Z992 Dependence on renal dialysis: Secondary | ICD-10-CM

## 2015-01-20 DIAGNOSIS — I120 Hypertensive chronic kidney disease with stage 5 chronic kidney disease or end stage renal disease: Secondary | ICD-10-CM

## 2015-01-20 DIAGNOSIS — N186 End stage renal disease: Secondary | ICD-10-CM

## 2015-01-20 DIAGNOSIS — J189 Pneumonia, unspecified organism: Secondary | ICD-10-CM

## 2015-01-20 DIAGNOSIS — D72829 Elevated white blood cell count, unspecified: Secondary | ICD-10-CM

## 2015-01-20 DIAGNOSIS — A419 Sepsis, unspecified organism: Secondary | ICD-10-CM

## 2015-01-20 LAB — RENAL FUNCTION PANEL
Albumin: 1.9 g/dL — ABNORMAL LOW (ref 3.5–5.0)
Anion gap: 13 (ref 5–15)
BUN: 33 mg/dL — ABNORMAL HIGH (ref 6–20)
CHLORIDE: 97 mmol/L — AB (ref 101–111)
CO2: 28 mmol/L (ref 22–32)
CREATININE: 6.06 mg/dL — AB (ref 0.61–1.24)
Calcium: 8.6 mg/dL — ABNORMAL LOW (ref 8.9–10.3)
GFR calc Af Amer: 11 mL/min — ABNORMAL LOW (ref >60)
GFR, EST NON AFRICAN AMERICAN: 9 mL/min — AB (ref >60)
GLUCOSE: 136 mg/dL — AB (ref 65–99)
Phosphorus: 4.9 mg/dL — ABNORMAL HIGH (ref 2.5–4.6)
Potassium: 4 mmol/L (ref 3.5–5.1)
SODIUM: 138 mmol/L (ref 135–145)

## 2015-01-20 LAB — BLOOD GAS, ARTERIAL
Acid-Base Excess: 1.2 mmol/L (ref 0.0–3.0)
Bicarbonate: 27.6 mEq/L (ref 21.0–28.0)
FIO2: 21 %
O2 Saturation: 85.2 %
PATIENT TEMPERATURE: 37
PO2 ART: 53 mmHg — AB (ref 83.0–108.0)
pCO2 arterial: 50 mmHg — ABNORMAL HIGH (ref 32.0–48.0)
pH, Arterial: 7.35 (ref 7.350–7.450)

## 2015-01-20 LAB — GLUCOSE, CAPILLARY
GLUCOSE-CAPILLARY: 166 mg/dL — AB (ref 70–99)
Glucose-Capillary: 145 mg/dL — ABNORMAL HIGH (ref 70–99)
Glucose-Capillary: 140 mg/dL — ABNORMAL HIGH (ref 70–99)

## 2015-01-20 MED ORDER — SODIUM CHLORIDE 0.9 % IV BOLUS (SEPSIS)
1000.0000 mL | Freq: Once | INTRAVENOUS | Status: AC
  Administered 2015-01-20: 1000 mL via INTRAVENOUS

## 2015-01-20 MED ORDER — NEPRO/CARBSTEADY PO LIQD: 1000.0000 mL | ORAL | Status: DC

## 2015-01-20 MED ORDER — GABAPENTIN 300 MG PO CAPS: 300.0000 mg | ORAL_CAPSULE | Freq: Every day | ORAL | Status: DC

## 2015-01-20 NOTE — Progress Notes (Signed)
PT Cancellation Note  Patient Details Name: [name] MRN: [id number] DOB: 05/05/1956   Cancelled Treatment:    Reason Eval/Treat Not Completed: Fatigue/lethargy limiting ability to participate;Patient's level of consciousness (patient opens eyes briefly to constant verbal/tactile stimuli, but immediately closes.  Unable to maintain alertness for adequate participation with session at this time.  Will continue efforts at later time/date as patient medically appropriate and able to participate)   Laury AxonKristen H Cliffard Hair 01/20/2015, 11:45 AM

## 2015-01-20 NOTE — Consult Note (Signed)
Palliative Medicine Inpatient Consult Note   Name: [name] Date: 01/20/2015 MRN: [id number]  DOB: [birth year]  Referring Physician: Houston Siren, MD  Palliative Care consult requested for this 59 y.o. male for goals of medical therapy in patient with ESRD on HD, T2DM and HTN.  Pt presented to [facility] from D[facility] on 7 May, 2016 due to pus from permanent dialysis site.  Pt workup shows Leukocytosis and PNA.  Pt admitted with sepsis.  Hospitalization complicated by c-diff infection.  Family not at bedside.  Unsure of where pt lives currently.  Pt has two brothers and [name] is pt's roommate.  Pt with no HCPOA.Marland Kitchen    REVIEW OF SYSTEMS:  Patient is not able to provide ROS    SOCIAL HISTORY:  reports that he has never smoked. He does not have any smokeless tobacco history on file. He reports that he does not drink alcohol or use illicit drugs.  LEGAL DOCUMENTS:  No HCPOA or advanced directive  CODE STATUS: Full code  PAST MEDICAL HISTORY: Past Medical History  Diagnosis Date  . Diabetes   . End stage renal disease on dialysis   . Anemia     PAST SURGICAL HISTORY: History reviewed. No pertinent past surgical history.  ALLERGIES:  is allergic to penicillins and penicillin g.  MEDICATIONS:  Current Facility-Administered Medications  Medication Dose Route Frequency Provider Last Rate Last Dose  . acetaminophen (TYLENOL) tablet 650 mg  650 mg Oral Q6H PRN Adrian Saran, MD   650 mg at 02/10/2015 1939   Or  . acetaminophen (TYLENOL) suppository 650 mg  650 mg Rectal Q6H PRN Adrian Saran, MD      . alum & mag hydroxide-simeth (MAALOX/MYLANTA) 200-200-20 MG/5ML suspension 30 mL  30 mL Oral Q6H PRN Adrian Saran, MD      . calcium acetate (Phos Binder) (PHOSLYRA) 667 MG/5ML oral solution 1,334 mg  1,334 mg Oral TID WC Adrian Saran, MD   1,334 mg at 01/20/15 1129  . docusate sodium (COLACE) capsule 100 mg  100 mg Oral BID Adrian Saran, MD   100 mg at 01/20/15 0928  . DULoxetine (CYMBALTA) DR capsule 60  mg  60 mg Oral Daily Adrian Saran, MD   60 mg at 01/20/15 0926  . ezetimibe (ZETIA) tablet 10 mg  10 mg Oral Daily Adrian Saran, MD   10 mg at 01/20/15 0928  . famotidine (PEPCID) tablet 10 mg  10 mg Oral BID Lamont Dowdy, MD   20 mg at 01/20/15 0927  . [START ON 01/21/2015] feeding supplement (NEPRO CARB STEADY) liquid 1,000 mL  1,000 mL Per Tube Q24H Lamont Dowdy, MD      . fluticasone (FLONASE) 50 MCG/ACT nasal spray 1 spray  1 spray Each Nare Daily Adrian Saran, MD   1 spray at 01/20/15 0925  . gabapentin (NEURONTIN) capsule 300 mg  300 mg Oral QID Adrian Saran, MD   300 mg at 01/20/15 1329  . hydrALAZINE (APRESOLINE) injection 10 mg  10 mg Intravenous Q4H PRN Wyatt Haste, MD      . hydrOXYzine (ATARAX/VISTARIL) tablet 25 mg  25 mg Oral Q6H PRN Adrian Saran, MD      . lactulose (CHRONULAC) 10 GM/15ML solution 10 g  10 g Oral TID Adrian Saran, MD   20 g at 01/20/15 0927  . levofloxacin (LEVAQUIN) IVPB 500 mg  500 mg Intravenous Q48H Houston Siren, MD   500 mg at 01/19/15 0801  . levothyroxine (SYNTHROID, LEVOTHROID) tablet 125 mcg  125 mcg Oral QAC breakfast Adrian SaranSital Mody, MD   125 mcg at 01/20/15 0928  . metroNIDAZOLE (FLAGYL) IVPB 500 mg  500 mg Intravenous Q8H Lamont DowdySarath Kolluru, MD   500 mg at 01/20/15 1129  . montelukast (SINGULAIR) tablet 10 mg  10 mg Oral QHS Adrian SaranSital Mody, MD   10 mg at 01/19/15 2214  . multivitamin (RENA-VIT) tablet 1 tablet  1 tablet Oral Daily Sital Mody, MD      . norepinephrine (LEVOPHED) 16 mg in dextrose 5 % 250 mL (0.064 mg/mL) infusion  0-40 mcg/min Intravenous Titrated Wyatt Hasteavid K Hower, MD 4.7 mL/hr at 01/20/15 1351 5 mcg/min at 01/20/15 1351  . ondansetron (ZOFRAN) tablet 4 mg  4 mg Oral Q6H PRN Adrian SaranSital Mody, MD       Or  . ondansetron (ZOFRAN) injection 4 mg  4 mg Intravenous Q6H PRN Adrian SaranSital Mody, MD      . oxybutynin (DITROPAN) tablet 5 mg  5 mg Oral BID Adrian SaranSital Mody, MD   5 mg at 01/20/15 0928  . oxyCODONE (Oxy IR/ROXICODONE) immediate release tablet 5 mg  5 mg Oral Q12H PRN  Adrian SaranSital Mody, MD   5 mg at 01/15/15 1657  . pantoprazole (PROTONIX) EC tablet 40 mg  40 mg Oral Daily Adrian SaranSital Mody, MD   40 mg at 01/20/15 0927  . polyethylene glycol (MIRALAX / GLYCOLAX) packet 17 g  17 g Oral Daily Adrian SaranSital Mody, MD   17 g at 01/20/15 0929  . senna-docusate (Senokot-S) tablet 1 tablet  1 tablet Oral QHS PRN Adrian SaranSital Mody, MD      . tiotropium (SPIRIVA) inhalation capsule 18 mcg  18 mcg Inhalation Daily Adrian SaranSital Mody, MD   18 mcg at 01/20/15 0926  . vancomycin (VANCOCIN) IVPB 1000 mg/200 mL premix  1,000 mg Intravenous Q dialysis Lamont DowdySarath Kolluru, MD   1,000 mg at 01/18/15 1859    Vital Signs: BP 114/69 mmHg  Pulse 93  Temp(Src) 97.8 F (36.6 C) (Axillary)  Resp 14  Ht 6\' 1"  (1.854 m)  Wt 87.8 kg (193 lb 9 oz)  BMI 25.54 kg/m2  SpO2 96% Filed Weights   01/18/15 1700 01/18/15 2015 01/20/15 1300  Weight: 88.7 kg (195 lb 8.8 oz) 88.7 kg (195 lb 8.8 oz) 87.8 kg (193 lb 9 oz)    Estimated body mass index is 25.54 kg/(m^2) as calculated from the following:   Height as of this encounter: 6\' 1"  (1.854 m).   Weight as of this encounter: 87.8 kg (193 lb 9 oz).  PERFORMANCE STATUS (ECOG) : 4 - Bedbound  PHYSICAL EXAM: General appearance: critically ill appearing Head: Normocephalic, without obvious abnormality, atraumatic Neck: supple, symmetrical, trachea midline and thyroid not enlarged, symmetric, no tenderness/mass/nodules Resp: clear to auscultation bilaterally Cardio: regular rate and rhythm, S1, S2 normal, no murmur, click, rub or gallop GI: soft, non-tender; bowel sounds normal; no masses,  no organomegaly Extremities: extremities normal, atraumatic, no cyanosis or edema Neurologic: Mental status: Alert, oriented, thought content appropriate, responsive to stimuli, lethargic  LABS: CBC:    Component Value Date/Time   WBC 38.4* 01/19/2015 0915   WBC 9.6 12/26/2014 1150   HGB 9.5* 01/19/2015 0915   HGB 10.1* 12/26/2014 1150   HCT 31.0* 01/19/2015 0915   HCT 32.3*  12/26/2014 1150   PLT 307 01/19/2015 0915   PLT 144* 12/26/2014 1150   MCV 87.7 01/19/2015 0915   MCV 87 12/26/2014 1150   NEUTROABS 9.7* 12/21/2014 0504   LYMPHSABS 1.4 12/21/2014 0504   MONOABS  1.3* 12/21/2014 0504   EOSABS 0.3 12/21/2014 0504   BASOSABS 0.1 12/21/2014 0504   Comprehensive Metabolic Panel:    Component Value Date/Time   NA 138 01/19/2015 0500   NA 136 12/26/2014 1150   K 4.0 01/19/2015 0500   K 4.0 12/26/2014 1150   CL 97* 01/19/2015 0500   CL 99* 12/26/2014 1150   CO2 28 01/19/2015 0500   CO2 30 12/26/2014 1150   BUN 33* 01/19/2015 0500   BUN 33* 12/26/2014 1150   CREATININE 6.06* 01/19/2015 0500   CREATININE 6.50* 12/26/2014 1150   GLUCOSE 136* 01/19/2015 0500   GLUCOSE 126* 12/26/2014 1150   CALCIUM 8.6* 01/19/2015 0500   CALCIUM 8.5* 12/26/2014 1150   AST 29 01/17/2015 2250   AST 15 12/26/2014 1150   ALT 14* 01/17/2015 2250   ALT 9* 12/26/2014 1150   ALKPHOS 82 01/17/2015 2250   ALKPHOS 58 12/26/2014 1150   BILITOT 1.2 01/17/2015 2250   PROT 6.4* 01/17/2015 2250   PROT 7.6 12/26/2014 1150   ALBUMIN 1.9* 01/19/2015 0500   ALBUMIN 2.6* 12/26/2014 1150    IMPRESSION: Palliative Care consult requested for this 59 y.o. male for goals of medical therapy in patient with ESRD on HD, T2DM and HTN.  Pt presented to [facility] from Dialysis on 7 May, 2016 due to pus from permanent dialysis site.  Pt workup shows Leukocytosis and PNA.  Pt admitted with sepsis.  Hospitalization complicated by c-diff infection.  Family not at bedside.  Pt with continued significant leukocytosis.  Pt is currently receiving pressors.  Pt with temporary dialysis site to L groin.  Pt unable to have goals of care conversation.  Spoke with pt's brother by phone and he agreed to goals of care conversation.  Pt is not married or has any children.  Pt currently lives with brother and was finishing up STR at [facility] before being admitted.  Brother states at baseline can walk with walker  and is independent of all ADL's.  Brother states pt has a good quality of life.  Pt has been on HD for 12 years.  Updated that pt is critically . Spoke with brother about code status and he states that he will need to speak with 3rd brother about this and will get back to me.    PLAN: 1. Continue current treatment 2. Family would consent to permanent dialysis site if able 3. Full Code    More than 50% of the visit was spent in counseling/coordination of care: Yes  Time Spent: 75 minutes

## 2015-01-20 NOTE — Progress Notes (Signed)
[facility]  ROUNDING NOTE   Subjective:   Still with altered mental status.  Hemodialysis for last two days. No UF.  Increasing leukocytosis.  Unable to swallow.  Norepinephrine gtt 5 mcg  Objective:  Vital signs in last 24 hours:  Temp:  [97.3 F (36.3 C)-98.5 F (36.9 C)] 97.8 F (36.6 C) (05/09 1600) Pulse Rate:  [86-105] 103 (05/09 1600) Resp:  [8-28] 28 (05/09 1600) BP: (76-131)/(54-78) 109/76 mmHg (05/09 1600) SpO2:  [92 %-98 %] 92 % (05/09 1600) Weight:  [87.8 kg (193 lb 9 oz)] 87.8 kg (193 lb 9 oz) (05/09 1300)  Weight change:  Filed Weights   01/18/15 1700 01/18/15 2015 01/20/15 1300  Weight: 88.7 kg (195 lb 8.8 oz) 88.7 kg (195 lb 8.8 oz) 87.8 kg (193 lb 9 oz)    Intake/Output: I/O last 3 completed shifts: In: 340 [I.V.:80; NG/GT:160; IV Piggyback:100] Out: 1 [Stool:1]   Intake/Output this shift:  Total I/O In: 665.8 [I.V.:45.8; NG/GT:220; IV Piggyback:400] Out: -   Physical Exam: General: Critically ill, lethargic  Head: Normocephalic, atraumatic. Moist oral mucosal membranes  Eyes: Anicteric, +icuterus  Neck: Supple, trachea midline  Lungs:  Diminished on right   Heart: Regular rate and rhythm  Abdomen:  Mild guarding  Extremities:  no peripheral edema.  Neurologic: Not oriented, lethargic.   Skin: No lesions  Access: Right femoral trialysis catheter 5/6    Basic Metabolic Panel:  Recent Labs Lab 01/16/15 1110 01/17/15 0037 01/17/15 2250 01/18/15 1720 01/19/15 0500 01/19/15 0915  NA 134* 135 135 133* 138  --   K 4.2 4.5 4.9 4.5 4.0  --   CL 97* 98* 96* 96* 97*  --   CO2 --   GLUCOSE 95 122* 128* 147* 136*  --   BUN 55* 60* 67* 44* 33*  --   CREATININE 10.90* 11.33* 12.03* 8.11* 6.06*  --   CALCIUM 8.3* 8.5* 8.5* 8.3* 8.6*  --   MG  --   --   --   --   --  2.3  PHOS 6.8* 7.3* 7.8* 6.1* 4.9*  --     Liver Function Tests:  Recent Labs Lab 01/16/15 1110 01/17/15 0037 01/17/15 2250 01/18/15 1720  01/19/15 0500  AST  --   --  29  --   --   ALT  --   --  14*  --   --   ALKPHOS  --   --  82  --   --   BILITOT  --   --  1.2  --   --   PROT  --   --  6.4*  --   --   ALBUMIN 2.3* 2.3* 2.2*  2.2* 1.9* 1.9*   No results for input(s): LIPASE, AMYLASE in the last 168 hours. No results for input(s): AMMONIA in the last 168 hours.  CBC:  Recent Labs Lab 01/16/15 1110 01/17/15 0038 01/17/15 2250 01/18/15 1720 01/19/15 0915  WBC 7.4 10.0 38.3*  36.6* 47.5* 38.4*  HGB 8.0* 8.5* 9.1*  9.1* 8.8* 9.5*  HCT 26.0* 28.1* 29.9*  30.1* 29.4* 31.0*  MCV 88.6 89.2 88.0  87.6 87.7 87.7  PLT 198 231 308  337 298 307    Cardiac Enzymes: No results for input(s): CKTOTAL, CKMB, CKMBINDEX, TROPONINI in the last 168 hours.  BNP: Invalid input(s): POCBNP  CBG:  Recent Labs Lab 01/18/15 1810 01/19/15 0001 01/19/15 0555 01/19/15 2350 01/20/15 1344  GLUCAP 134*  113* 146* 166* 145*    Microbiology: Results for orders placed or performed during the hospital encounter of 09/18/14  Culture, blood (routine x 2)     Status: None   Collection Time: 09/18/14 12:57 PM  Result Value Ref Range Status   Specimen Description BLOOD  Final   Special Requests BLOOD  Final   Culture NO GROWTH 5 DAYS  Final   Report Status 01/19/2015 FINAL  Final  Culture, blood (routine x 2)     Status: None   Collection Time: 09/18/14 12:57 PM  Result Value Ref Range Status   Specimen Description BLOOD  Final   Special Requests BLOOD  Final   Culture NO GROWTH 5 DAYS  Final   Report Status 01/19/2015 FINAL  Final  Cath Tip Culture     Status: None   Collection Time: 09/18/14  5:57 PM  Result Value Ref Range Status   Specimen Description CATH TIP  Final   Special Requests NONE  Final   Culture NO GROWTH 3 DAYS  Final   Report Status 01/18/2015 FINAL  Final  C difficile quick scan w PCR reflex [facility]     Status: None   Collection Time: 01/15/15 11:02 AM  Result Value Ref Range Status   C Diff antigen  POSITIVE  Final   C Diff toxin NEGATIVE  Final   C Diff interpretation   Final    Positive for toxigenic C. difficile, active toxin production not detected. Patient has toxigenic C. difficile organisms present in the bowel, but toxin was not detected. The patient may be a carrier or the level of toxin in the sample was below the limit  of detection. This information should be used in conjunction with the patient's clinical history when deciding on possible therapy.     Comment: CRITICAL RESULT CALLED TO, READ BACK BY AND VERIFIED WITH: Zettie CooleyMARY WITHERSPOON @ 1400 01/15/15 BGB   Clostridium Difficile by PCR     Status: Abnormal   Collection Time: 01/15/15 11:02 AM  Result Value Ref Range Status   C difficile by pcr POSITIVE (A) NEGATIVE Final  Culture, blood (routine x 2)     Status: None (Preliminary result)   Collection Time: 01/17/15 11:03 PM  Result Value Ref Range Status   Specimen Description BLOOD  Final   Special Requests BLOOD  Final   Culture NO GROWTH 3 DAYS  Final   Report Status PENDING  Incomplete  Culture, blood (routine x 2)     Status: None (Preliminary result)   Collection Time: 01/17/15 11:03 PM  Result Value Ref Range Status   Specimen Description BLOOD  Final   Special Requests BLOOD  Final   Culture NO GROWTH 3 DAYS  Final   Report Status PENDING  Incomplete    Coagulation Studies: No results for input(s): LABPROT, INR in the last 72 hours.  Urinalysis: No results for input(s): COLORURINE, LABSPEC, PHURINE, GLUCOSEU, HGBUR, BILIRUBINUR, KETONESUR, PROTEINUR, UROBILINOGEN, NITRITE, LEUKOCYTESUR in the last 72 hours.  Invalid input(s): APPERANCEUR    Imaging: Dg Abd Portable 1v  01/19/2015   CLINICAL DATA:  Nasogastric tube placed.  EXAM: PORTABLE ABDOMEN - 1 VIEW  COMPARISON:  12/24/2014.  FINDINGS: Interval nasogastric tube with its tip in the proximal stomach. Mild gaseous distention of the stomach and mid abdominal bowel loops. Breathing motion blurring. Left  femoral catheter tip in the inferior aspect of the inferior vena cava. Lumbar and lower thoracic spine degenerative changes and scoliosis.  IMPRESSION: 1.  Nasogastric tube tip in the proximal stomach. 2. Mild gastric and bowel distention, possibly due to partial small bowel obstruction.   Electronically Signed   By: Beckie SaltsSteven  Reid M.D.   On: 01/19/2015 11:33     Medications:   . norepinephrine (LEVOPHED) Adult infusion 5 mcg/min (01/20/15 1351)   . calcium acetate (Phos Binder)  1,334 mg Oral TID WC  . DULoxetine  60 mg Oral Daily  . ezetimibe  10 mg Oral Daily  . famotidine  10 mg Oral BID  . [START ON 01/21/2015] feeding supplement (NEPRO CARB STEADY)  1,000 mL Per Tube Q24H  . fluticasone  1 spray Each Nare Daily  . gabapentin  300 mg Oral QID  . levofloxacin (LEVAQUIN) IV  500 mg Intravenous Q48H  . levothyroxine  125 mcg Oral QAC breakfast  . metronidazole  500 mg Intravenous Q8H  . montelukast  10 mg Oral QHS  . multivitamin  1 tablet Oral Daily  . oxybutynin  5 mg Oral BID  . pantoprazole  40 mg Oral Daily  . tiotropium  18 mcg Inhalation Daily   acetaminophen **OR** acetaminophen, hydrALAZINE, hydrOXYzine, ondansetron **OR** ondansetron (ZOFRAN) IV, oxyCODONE, vancomycin  Assessment/ Plan:  59 y.o. black male  With asthma, diabetes mellitus type II, diabetic retinopathy, hypertension, gout, hyperlipidemia, and morbid obesity and ESRD   [facility] TTS first shift.   1. End stage renal disease: with infected catheter. Now removed.  Hemodialysis treatment that last two days for uremic encephalopathy. However still with mental status changes  Will need tunnelled catheter eventually. [facility] show 5/3 Kebsiella: placed on levofoxacin  2. Metabolic Encephalopathy:G93.41   - likely sepsis - monitor  3. Right upper lobe  acute pneumonia with sepsis: J18.9. I45.9 - multiple antibiotics  - norepinephrine gtt - bolus iv NS x 1000 cc- patient clinically  dry  4. Anemia of chronic kidney disease: hemoglobin of 9.5   5. C Diff positive - abx per Primary team  6. Secondary Hyperparathyroidism: PTH elevated as outpatient at 1120, phos at goal at 4.9 -continue calcium acetate liquid for binding when back on diet.   - Tube feeds continued   7. Malnutrition/Cachexia: R64. sepsis contributing     LOS: 6 [name] 5/9/20164:56 PM

## 2015-01-20 NOTE — Progress Notes (Signed)
Spoke to both Dr. Thedore MinsSingh and Dr. Cherlynn KaiserSainani- Clarified if patient is to go to dialysis today- per Dr. Wynelle LinkKolluru this am via telephone- order for dialysis tomorrow.   Labs not drawn through trialysis due to unable to draw back blood- Both MD ordered to have lab drawn through dialysis tomorrow.

## 2015-01-20 NOTE — Progress Notes (Signed)
Pt transferred via wheelchair to 247, alert, oriented, all belongings with patient.

## 2015-01-20 NOTE — Progress Notes (Signed)
[facility]  Daily Progress Note   Subjective  -  Patient has severely decreased MS with some evidence of improvement in his mental status  Objective Filed Vitals:   01/20/15 1500 01/20/15 1600 01/20/15 1700 01/20/15 1800  BP: 112/69 109/76 93/68 113/74  Pulse: 99 103 97 96  Temp:  97.8 F (36.6 C)    TempSrc:  Axillary    Resp: 8 28 10 28   Height:      Weight:      SpO2: 94% 92% 92% 88%    Intake/Output Summary (Last 24 hours) at 01/20/15 1902 Last data filed at 01/20/15 1846  Gross per 24 hour  Intake 1556.34 ml  Output      0 ml  Net 1556.34 ml    PULM  Normal effort , no use of accessory muscles CV  No JVD, RRR Abd      No distended, nontender VASC  right groin intact no drainage or purulence, left femoral catheter clean dry and intact  Laboratory CBC    Component Value Date/Time   WBC 38.4* 01/19/2015 0915   WBC 9.6 12/26/2014 1150   HGB 9.5* 01/19/2015 0915   HGB 10.1* 12/26/2014 1150   HCT 31.0* 01/19/2015 0915   HCT 32.3* 12/26/2014 1150   PLT 307 01/19/2015 0915   PLT 144* 12/26/2014 1150    BMET    Component Value Date/Time   NA 138 01/19/2015 0500   NA 136 12/26/2014 1150   K 4.0 01/19/2015 0500   K 4.0 12/26/2014 1150   CL 97* 01/19/2015 0500   CL 99* 12/26/2014 1150   CO2 28 01/19/2015 0500   CO2 30 12/26/2014 1150   GLUCOSE 136* 01/19/2015 0500   GLUCOSE 126* 12/26/2014 1150   BUN 33* 01/19/2015 0500   BUN 33* 12/26/2014 1150   CREATININE 6.06* 01/19/2015 0500   CREATININE 6.50* 12/26/2014 1150   CALCIUM 8.6* 01/19/2015 0500   CALCIUM 8.5* 12/26/2014 1150   GFRNONAA 9* 01/19/2015 0500   GFRNONAA 9* 12/26/2014 1150   GFRAA 11* 01/19/2015 0500   GFRAA 10* 12/26/2014 1150    Assessment/Planning:   End-stage renal disease requiring hemodialysis: Currently patient has adequate access with the Right catheter given his overall guarded condition no indication for further evaluation regarding permanent access at  this time Mental status changes: Subtle improvement in his mental status compared to yesterday. In discussion with Dr. Thedore MinsSingh this could be secondary to administration of Neurontin and should improve over the next several days.  [name], [name]  01/20/2015, 7:02 PM

## 2015-01-20 NOTE — Progress Notes (Signed)
Patient with limited response this shift. He does open his eyes to name and will follow simple commands. He has an NG with Nepro @ 4o ml/hr and tolerating well. Trialysis catheter does not have a blood return and unable to obtain labs. He has not voided this shift and has had 4 bowel movements. He has a NS bolus infusing at 333 ml/hr and levo at 4 mcg. No visitors today.

## 2015-01-20 NOTE — Clinical Documentation Improvement (Signed)
  The 01/16/15 Progress Note states, "Morbid obesity."; Patient's BMI= 25.54. The 01/17/15 and 01/18/15 Progress Notes state, "Malnutrition, cachexia."  Please clarify the specific medical condition related to the clinical findings.  Mild Malnutrition Moderate Malnutrition Severe Malnutrition Other explanation for clinical findings Unable to clinically determine  Thank you,  Sharyn Creameronna Jorgen Wolfinger, BSN, RN [facility] HIM/Clinical Documentation Specialist [email or web address] [phone]

## 2015-01-20 NOTE — Progress Notes (Signed)
[facility]W aware. Pt too lethargic to participate with PT this Estate manager/land agentmorningTeri Markeesha Char RN CCM [facility]

## 2015-01-20 NOTE — Progress Notes (Addendum)
  Please note.  Placed order for Renal Function Panel on behalf of Dr. Wynelle LinkKolluru for 01/19/15 due to technical difficulties that he was having with [employer].  Kaysen Deal H 01/20/2015 7:34 AM

## 2015-01-20 NOTE — Progress Notes (Signed)
[facility] [place] [facility]   PATIENT NAME: [name] ReddenJerry [name]    MR#:  [id number]  DATE OF BIRTH:  05/24/1956  SUBJECTIVE:   Still lethargic, difficult to understand but follows simple commands.  Having some loose stools.  Not tolerating anything PO presently.   REVIEW OF SYSTEMS:   Review of Systems  Constitutional: Negative for fever and chills.  HENT: Negative for congestion and tinnitus.   Eyes: Negative for blurred vision and double vision.  Respiratory: Negative for cough, shortness of breath and wheezing.   Cardiovascular: Negative for chest pain, orthopnea and PND.  Gastrointestinal: Positive for diarrhea. Negative for nausea, vomiting and abdominal pain.  Genitourinary: Negative for dysuria and hematuria.  Neurological: Negative for dizziness, sensory change and focal weakness.  All other systems reviewed and are negative.  Nutrition: Tube Feeds Tolerating PT: No Tolerating diet: No due to encephalopathy.   DRUG ALLERGIES:   Allergies  Allergen Reactions  . Penicillins Hives  . Penicillin G Other (See Comments)    VITALS:  Blood pressure 112/69, pulse 99, temperature 97.8 F (36.6 C), temperature source Axillary, resp. rate 8, height 6\' 1"  (1.854 m), weight 87.8 kg (193 lb 9 oz), SpO2 94 %.  PHYSICAL EXAMINATION:  GENERAL:  59 y.o.-year-old patient lethargic lying in the bed with no acute distress.  EYES: Pupils equal, round, reactive to light and accommodation. No scleral icterus. Extraocular muscles intact.  HEENT: Head atraumatic, normocephalic. Dry Oral Mucosa. NG tube in place.  NECK:  Supple, no jugular venous distention. No thyroid enlargement, no tenderness.  LUNGS: Normal breath sounds bilaterally, no wheezing, rales,rhonchi or crepitation. No use of accessory muscles of respiration.  CARDIOVASCULAR: S1, S2 normal. No murmurs, rubs, or gallops.  ABDOMEN: Soft, nontender, nondistended. Bowel sounds present. No organomegaly or  mass.  EXTREMITIES: No pedal edema, cyanosis, or clubbing.  NEUROLOGIC: remains lethargic, encephalopathic, follows simple commands. Slurred speech. PSYCHIATRIC: Encephalopathic but alert.  SKIN: No obvious rash, lesion, or ulcer.    LABORATORY PANEL:   CBC  Recent Labs Lab 01/18/15 1720 01/19/15 0915  WBC 47.5* 38.4*  HGB 8.8* 9.5*  HCT 29.4* 31.0*  PLT 298 307   ------------------------------------------------------------------------------------------------------------------  Chemistries   Recent Labs Lab 01/17/15 2250 01/18/15 1720 01/19/15 0500 01/19/15 0915  NA 135 133* 138  --   K 4.9 4.5 4.0  --   CL 96* 96* 97*  --   CO2 25 27 28   --   GLUCOSE 128* 147* 136*  --   BUN 67* 44* 33*  --   CREATININE 12.03* 8.11* 6.06*  --   CALCIUM 8.5* 8.3* 8.6*  --   MG  --   --   --  2.3  AST 29  --   --   --   ALT 14*  --   --   --   ALKPHOS 82  --   --   --   BILITOT 1.2  --   --   --    ------------------------------------------------------------------------------------------------------------------  Cardiac Enzymes No results for input(s): TROPONINI in the last 168 hours. ------------------------------------------------------------------------------------------------------------------  RADIOLOGY:  Dg Abd Portable 1v  01/19/2015   CLINICAL DATA:  Nasogastric tube placed.  EXAM: PORTABLE ABDOMEN - 1 VIEW  COMPARISON:  12/24/2014.  FINDINGS: Interval nasogastric tube with its tip in the proximal stomach. Mild gaseous distention of the stomach and mid abdominal bowel loops. Breathing motion blurring. Left femoral catheter tip in the inferior aspect of the inferior vena cava.  Lumbar and lower thoracic spine degenerative changes and scoliosis.  IMPRESSION: 1. Nasogastric tube tip in the proximal stomach. 2. Mild gastric and bowel distention, possibly due to partial small bowel obstruction.   Electronically Signed   By: Beckie SaltsSteven  Reid M.D.   On: 01/19/2015 11:33      ASSESSMENT AND PLAN:   * Dialysis access site infection - BC at outpatient dialysis were + & growing Klebsiella.   - s/p Perm Cath removal by Dr schnier on 01/26/2015 - cont. Levaquin for Klebsiella for now.  - BC from 02/02/2015 & Cath tip cx 01/29/2015 neg so far - s/p temp. Cath placement.   * End-stage renal disease on hemodialysis. - Nephro following and cont. HD as per them.   * Sepsis - likely due to pneumonia as seen on CT chest/dialysis access infection.   - afebrile, but remains on levophed.  Keep MAP > 55.  - cont. Empiric abx w/ Vanco, Levaquin, Flagyl.  - cultures (-) so far.   * AMS/Encephalopathy - thought to be related to uremia/sepsis but pt. Is s/p HD X 2 days and mental status not much improved.  - CT head (-).  Does have a leukocytosis and CT chest suggestive of pneumonia.  ?? Metabolic encephalopathy from sepsis.  - cont. IV Vanc, Levaquin, Flagyl and follow clinically.  - appreciate Neuro eval and no further intervention and cont. Current care.  No need for EEG.  - will monitor.   * Leukocytosis - ?? Related to pneumonia/sepsis.  - follow WBC count w/ IV abx therapy. Trending down.   * Diabetes type II w/out complication.  - BS stable now but pt. Not eating much.  - hold off po glipizide and Insulin  * Clostridium difficile colitis - pt. Is still having diarrhea but remains on laxatives.  - will d/c colace, Mirlax, lactulose, MOM.  - cont. po flagyl.   * Essential Hypertension. - bp on the lower side - hold bp meds  * DM Neuropathy - cont. Neurontin.   * COPD - no acute exacerbation.  - cont. Spiriva.   * Depression - cont. Cymbalta  * Hypothyroidism - cont. Synthroid.   * Nutrition - s/p NG tube and tolerating tube feeds for now.   CODE STATUS: Full code.    All the records are reviewed and case discussed with Care Management/Social Workerr. Management plans discussed with the patient, family and they are in agreement.  CODE STATUS: full  code  TOTAL Critical care TIME TAKING CARE OF THIS PATIENT: 30 mins   Karo Rog J M.D on 01/20/2015 at 3:54 PM  Between 7am to 6pm - Pager - [phone]  After 6pm go to [email or web address] - password [email or web address] [facility]  [facility]  Office  [phone]  CC: Primary care physician; No primary care provider on file.

## 2015-01-20 NOTE — Progress Notes (Signed)
Nutrition Follow-up  DOCUMENTATION CODES:     INTERVENTION:  EN: Recommend increasing nepro to 5740ml/hr and if tolerating after 8 hr increase to goal rate of 2952ml/hr to meet 100% nutritional needs.    NUTRITION DIAGNOSIS:  Inadequate oral intake related to lethargy/confusion as evidenced by estimated needs, meal completion < 25% but being addressed with TF    GOAL:  Goal for pt to tolerate initiation of TF at goal rate within 24 hr    MONITOR:  EN Electrolyte and renal profile Glucose profile Digestive system UOP Anthropometrics  REASON FOR ASSESSMENT:   (follow-up) Enteral/tube feeding initiation and management  ASSESSMENT:  Pt continues with encephalopathy, lethargic, not alert enough to take po intake.  Medications: reviewed Labs: Electrolyte and Renal Profile:    Recent Labs Lab 01/17/15 2250 01/18/15 1720 01/19/15 0500 01/19/15 0915  BUN 67* 44* 33*  --   CREATININE 12.03* 8.11* 6.06*  --   NA 135 133* 138  --   K 4.9 4.5 4.0  --   MG  --   --   --  2.3  PHOS 7.8* 6.1* 4.9*  --      Height:  Ht Readings from Last 1 Encounters:  01/15/2015 6\' 1"  (1.854 m)    Weight:  Wt Readings from Last 1 Encounters:  01/20/15 193 lb 9 oz (87.8 kg)    Ideal Body Weight:     Wt Readings from Last 10 Encounters:  01/20/15 193 lb 9 oz (87.8 kg)    BMI:  Body mass index is 25.54 kg/(m^2).  Estimated Nutritional Needs:  Kcal:  2240-2647kcals, BEE: 1697kcals  Protein:  102-128g protein (1.0-1.2g/kg)  Fluid:  UOP+107800mL      Diet Order:  Diet NPO time specified  EDUCATION NEEDS:  Education needs no appropriate at this time   Intake/Output Summary (Last 24 hours) at 01/20/15 1406 Last data filed at 01/20/15 1351  Gross per 24 hour  Intake 732.66 ml  Output      0 ml  Net 732.66 ml    Last BM:  5/9, loose stool today noted  High level of care  Shaydon Lease B. Freida BusmanAllen, RD, LDN [phone] (pager)

## 2015-01-21 LAB — BASIC METABOLIC PANEL
Anion gap: 7 (ref 5–15)
BUN: 54 mg/dL — AB (ref 6–20)
CHLORIDE: 102 mmol/L (ref 101–111)
CO2: 29 mmol/L (ref 22–32)
CREATININE: 6.61 mg/dL — AB (ref 0.61–1.24)
Calcium: 8.6 mg/dL — ABNORMAL LOW (ref 8.9–10.3)
GFR calc Af Amer: 10 mL/min — ABNORMAL LOW (ref >60)
GFR calc non Af Amer: 8 mL/min — ABNORMAL LOW (ref >60)
Glucose, Bld: 193 mg/dL — ABNORMAL HIGH (ref 65–99)
Potassium: 4 mmol/L (ref 3.5–5.1)
Sodium: 138 mmol/L (ref 135–145)

## 2015-01-21 LAB — GLUCOSE, CAPILLARY
GLUCOSE-CAPILLARY: 195 mg/dL — AB (ref 70–99)
GLUCOSE-CAPILLARY: 168 mg/dL — AB (ref 70–99)
GLUCOSE-CAPILLARY: 149 mg/dL — AB (ref 70–99)
GLUCOSE-CAPILLARY: 140 mg/dL — AB (ref 70–99)
Glucose-Capillary: 141 mg/dL — ABNORMAL HIGH (ref 70–99)

## 2015-01-21 LAB — CBC
HEMATOCRIT: 28.1 % — AB (ref 40.0–52.0)
Hemoglobin: 8.2 g/dL — ABNORMAL LOW (ref 13.0–18.0)
MCH: 25.9 pg — ABNORMAL LOW (ref 26.0–34.0)
MCHC: 29.2 g/dL — ABNORMAL LOW (ref 32.0–36.0)
MCV: 88.7 fL (ref 80.0–100.0)
PLATELETS: 261 10*3/uL (ref 150–440)
RBC: 3.17 MIL/uL — ABNORMAL LOW (ref 4.40–5.90)
RDW: 18.3 % — AB (ref 11.5–14.5)
WBC: 25.2 10*3/uL — AB (ref 3.8–10.6)

## 2015-01-21 MED ORDER — ALTEPLASE 2 MG IJ SOLR
2.0000 mg | Freq: Once | INTRAMUSCULAR | Status: AC
  Administered 2015-01-21: 2 mg
  Filled 2015-01-21 (×2): qty 2

## 2015-01-21 MED ORDER — METRONIDAZOLE IN NACL 5-0.79 MG/ML-% IV SOLN
500.0000 mg | Freq: Three times a day (TID) | INTRAVENOUS | Status: DC
  Administered 2015-01-21: 500 mg via INTRAVENOUS
  Filled 2015-01-21 (×3): qty 100

## 2015-01-21 MED ORDER — PANCRELIPASE (LIP-PROT-AMYL) 12000-38000 UNITS PO CPEP
2.0000 | ORAL_CAPSULE | Freq: Once | ORAL | Status: AC
  Administered 2015-01-22: 24000 [IU] via ORAL
  Filled 2015-01-21: qty 2

## 2015-01-21 MED ORDER — METRONIDAZOLE 500 MG PO TABS
500.0000 mg | ORAL_TABLET | Freq: Three times a day (TID) | ORAL | Status: DC
  Administered 2015-01-21 – 2015-01-24 (×8): 500 mg via ORAL
  Filled 2015-01-21 (×8): qty 1

## 2015-01-21 MED ORDER — SODIUM CHLORIDE 0.9 % IV BOLUS (SEPSIS)
1000.0000 mL | Freq: Once | INTRAVENOUS | Status: AC
  Administered 2015-01-21: 1000 mL via INTRAVENOUS

## 2015-01-21 MED ORDER — NEPRO/CARBSTEADY PO LIQD
1000.0000 mL | ORAL | Status: DC
  Administered 2015-01-21 – 2015-01-23 (×4): 1000 mL

## 2015-01-21 MED ORDER — EPOETIN ALFA 10000 UNIT/ML IJ SOLN
10000.0000 [IU] | INTRAMUSCULAR | Status: DC
  Administered 2015-01-21: 10000 [IU] via SUBCUTANEOUS
  Filled 2015-01-21: qty 1

## 2015-01-21 MED ORDER — STERILE WATER FOR INJECTION IJ SOLN
INTRAMUSCULAR | Status: AC
  Administered 2015-01-21: 19:00:00
  Filled 2015-01-21: qty 10

## 2015-01-21 MED ORDER — PANTOPRAZOLE SODIUM 40 MG PO PACK
40.0000 mg | PACK | Freq: Every day | ORAL | Status: DC
  Administered 2015-01-21 – 2015-01-24 (×4): 40 mg
  Filled 2015-01-21 (×4): qty 20

## 2015-01-21 MED ORDER — SODIUM BICARBONATE 650 MG PO TABS
650.0000 mg | ORAL_TABLET | Freq: Once | ORAL | Status: AC
  Administered 2015-01-22: 650 mg via ORAL
  Filled 2015-01-21: qty 1

## 2015-01-21 MED ORDER — FAMOTIDINE 20 MG PO TABS
10.0000 mg | ORAL_TABLET | Freq: Every day | ORAL | Status: DC
  Administered 2015-01-22 – 2015-01-24 (×3): 10 mg via ORAL
  Filled 2015-01-21 (×3): qty 1

## 2015-01-21 MED ORDER — DARBEPOETIN ALFA 100 MCG/0.5ML IJ SOSY
100.0000 ug | PREFILLED_SYRINGE | INTRAMUSCULAR | Status: DC
  Filled 2015-01-21: qty 0.5

## 2015-01-21 MED ORDER — ALTEPLASE 2 MG IJ SOLR
2.0000 mg | Freq: Once | INTRAMUSCULAR | Status: AC
  Administered 2015-01-21: 2 mg
  Filled 2015-01-21: qty 2

## 2015-01-21 MED ORDER — VANCOMYCIN HCL IN DEXTROSE 1-5 GM/200ML-% IV SOLN
1000.0000 mg | INTRAVENOUS | Status: DC
  Administered 2015-01-23: 1000 mg via INTRAVENOUS
  Filled 2015-01-21 (×3): qty 200

## 2015-01-21 NOTE — Progress Notes (Signed)
[facility]  ROUNDING NOTE   Subjective:   Still with altered mental status but appears to be more conversive. Neurontin dose decreased   Norepinephrine gtt 5 mcg continued at low dose NS bolus given  Objective:  Vital signs in last 24 hours:  Temp:  [97.6 F (36.4 C)-97.8 F (36.6 C)] 97.6 F (36.4 C) (05/10 0900) Pulse Rate:  [86-103] 96 (05/10 1000) Resp:  [8-28] 18 (05/10 1000) BP: (76-131)/(44-78) 95/63 mmHg (05/10 1000) SpO2:  [88 %-98 %] 96 % (05/10 1000) Weight:  [87.8 kg (193 lb 9 oz)-89.4 kg (197 lb 1.5 oz)] 89.4 kg (197 lb 1.5 oz) (05/10 0500)  Weight change:  Filed Weights   01/18/15 2015 01/20/15 1300 01/21/15 0500  Weight: 88.7 kg (195 lb 8.8 oz) 87.8 kg (193 lb 9 oz) 89.4 kg (197 lb 1.5 oz)    Intake/Output: I/O last 3 completed shifts: In: 2158.5 [I.V.:184.1; NG/GT:880; IV Piggyback:1094.4] Out: 1 [Stool:1]   Intake/Output this shift:  Total I/O In: 310.2 [I.V.:15.2; NG/GT:295] Out: 0   Physical Exam: General: Critically ill, lethargic  Head: Normocephalic, atraumatic. Moist oral mucosal membranes  Eyes: closed  Neck: Supple, trachea midline  Lungs:  Diminished on right   Heart: Regular rate and rhythm  Abdomen:  Mild guarding  Extremities:  no peripheral edema.  Neurologic: Not oriented, lethargic.   Skin: No lesions, decreased turgor  Access: Right femoral trialysis catheter 5/6    Basic Metabolic Panel:  Recent Labs Lab 01/16/15 1110 01/17/15 0037 01/17/15 2250 01/18/15 1720 01/19/15 0500 01/19/15 0915  NA 134* 135 135 133* 138  --   K 4.2 4.5 4.9 4.5 4.0  --   CL 97* 98* 96* 96* 97*  --   CO2 --   GLUCOSE 95 122* 128* 147* 136*  --   BUN 55* 60* 67* 44* 33*  --   CREATININE 10.90* 11.33* 12.03* 8.11* 6.06*  --   CALCIUM 8.3* 8.5* 8.5* 8.3* 8.6*  --   MG  --   --   --   --   --  2.3  PHOS 6.8* 7.3* 7.8* 6.1* 4.9*  --     Liver Function Tests:  Recent Labs Lab 01/16/15 1110 01/17/15 0037  01/17/15 2250 01/18/15 1720 01/19/15 0500  AST  --   --  29  --   --   ALT  --   --  14*  --   --   ALKPHOS  --   --  82  --   --   BILITOT  --   --  1.2  --   --   PROT  --   --  6.4*  --   --   ALBUMIN 2.3* 2.3* 2.2*  2.2* 1.9* 1.9*   No results for input(s): LIPASE, AMYLASE in the last 168 hours. No results for input(s): AMMONIA in the last 168 hours.  CBC:  Recent Labs Lab 01/16/15 1110 01/17/15 0038 01/17/15 2250 01/18/15 1720 01/19/15 0915  WBC 7.4 10.0 38.3*  36.6* 47.5* 38.4*  HGB 8.0* 8.5* 9.1*  9.1* 8.8* 9.5*  HCT 26.0* 28.1* 29.9*  30.1* 29.4* 31.0*  MCV 88.6 89.2 88.0  87.6 87.7 87.7  PLT 198 231 308  337 298 307    Cardiac Enzymes: No results for input(s): CKTOTAL, CKMB, CKMBINDEX, TROPONINI in the last 168 hours.  BNP: Invalid input(s): POCBNP  CBG:  Recent Labs Lab 01/19/15 0555 01/19/15 2350 01/20/15 1344 01/20/15  1712 01/21/15 0539  GLUCAP 146* 166* 145* 140* 195*    Microbiology: Results for orders placed or performed during the hospital encounter of 01/18/2015  Culture, blood (routine x 2)     Status: None   Collection Time: 01/30/2015 12:57 PM  Result Value Ref Range Status   Specimen Description BLOOD  Final   Special Requests BLOOD  Final   Culture NO GROWTH 5 DAYS  Final   Report Status 01/19/2015 FINAL  Final  Culture, blood (routine x 2)     Status: None   Collection Time: 01/21/2015 12:57 PM  Result Value Ref Range Status   Specimen Description BLOOD  Final   Special Requests BLOOD  Final   Culture NO GROWTH 5 DAYS  Final   Report Status 01/19/2015 FINAL  Final  Cath Tip Culture     Status: None   Collection Time: 02/02/2015  5:57 PM  Result Value Ref Range Status   Specimen Description CATH TIP  Final   Special Requests NONE  Final   Culture NO GROWTH 3 DAYS  Final   Report Status 01/18/2015 FINAL  Final  C difficile quick scan w PCR reflex [facility])     Status: None   Collection Time: 01/15/15 11:02 AM  Result Value Ref  Range Status   C Diff antigen POSITIVE  Final   C Diff toxin NEGATIVE  Final   C Diff interpretation   Final    Positive for toxigenic C. difficile, active toxin production not detected. Patient has toxigenic C. difficile organisms present in the bowel, but toxin was not detected. The patient may be a carrier or the level of toxin in the sample was below the limit  of detection. This information should be used in conjunction with the patient's clinical history when deciding on possible therapy.     Comment: CRITICAL RESULT CALLED TO, READ BACK BY AND VERIFIED WITH: Zettie CooleyMARY WITHERSPOON @ 1400 01/15/15 BGB   Clostridium Difficile by PCR     Status: Abnormal   Collection Time: 01/15/15 11:02 AM  Result Value Ref Range Status   C difficile by pcr POSITIVE (A) NEGATIVE Final  Culture, blood (routine x 2)     Status: None (Preliminary result)   Collection Time: 01/17/15 11:03 PM  Result Value Ref Range Status   Specimen Description BLOOD  Final   Special Requests BLOOD  Final   Culture NO GROWTH 3 DAYS  Final   Report Status PENDING  Incomplete  Culture, blood (routine x 2)     Status: None (Preliminary result)   Collection Time: 01/17/15 11:03 PM  Result Value Ref Range Status   Specimen Description BLOOD  Final   Special Requests BLOOD  Final   Culture NO GROWTH 3 DAYS  Final   Report Status PENDING  Incomplete    Coagulation Studies: No results for input(s): LABPROT, INR in the last 72 hours.  Urinalysis: No results for input(s): COLORURINE, LABSPEC, PHURINE, GLUCOSEU, HGBUR, BILIRUBINUR, KETONESUR, PROTEINUR, UROBILINOGEN, NITRITE, LEUKOCYTESUR in the last 72 hours.  Invalid input(s): APPERANCEUR    Imaging: Dg Abd Portable 1v  01/19/2015   CLINICAL DATA:  Nasogastric tube placed.  EXAM: PORTABLE ABDOMEN - 1 VIEW  COMPARISON:  12/24/2014.  FINDINGS: Interval nasogastric tube with its tip in the proximal stomach. Mild gaseous distention of the stomach and mid abdominal bowel loops.  Breathing motion blurring. Left femoral catheter tip in the inferior aspect of the inferior vena cava. Lumbar and lower thoracic spine degenerative  changes and scoliosis.  IMPRESSION: 1. Nasogastric tube tip in the proximal stomach. 2. Mild gastric and bowel distention, possibly due to partial small bowel obstruction.   Electronically Signed   By: Beckie SaltsSteven  Reid M.D.   On: 01/19/2015 11:33     Medications:   . norepinephrine (LEVOPHED) Adult infusion 5 mcg/min (01/20/15 1351)   . calcium acetate (Phos Binder)  1,334 mg Oral TID WC  . DULoxetine  60 mg Oral Daily  . ezetimibe  10 mg Oral Daily  . famotidine  10 mg Oral BID  . feeding supplement (NEPRO CARB STEADY)  1,000 mL Per Tube Q24H  . fluticasone  1 spray Each Nare Daily  . gabapentin  300 mg Oral QHS  . levofloxacin (LEVAQUIN) IV  500 mg Intravenous Q48H  . levothyroxine  125 mcg Oral QAC breakfast  . metronidazole  500 mg Intravenous Q8H  . montelukast  10 mg Oral QHS  . multivitamin  1 tablet Oral Daily  . oxybutynin  5 mg Oral BID  . pantoprazole sodium  40 mg Per Tube Daily  . tiotropium  18 mcg Inhalation Daily   acetaminophen **OR** acetaminophen, hydrALAZINE, ondansetron **OR** ondansetron (ZOFRAN) IV, oxyCODONE, vancomycin  Assessment/ Plan:  59 y.o. black male  With asthma, diabetes mellitus type II, diabetic retinopathy, hypertension, gout, hyperlipidemia, and morbid obesity and ESRD   [facility] [address] TTS first shift.   1. End stage renal disease: with infected catheter. Now removed.  Will need tunnelled catheter eventually. [facility] show 5/3 Kebsiella: placed on levofoxacin  2. Metabolic Encephalopathy:G93.41   - likely sepsis, or medication effect - neurontin  3. Right upper lobe  acute pneumonia with sepsis: J18.9. I45.9 - multiple antibiotics  - norepinephrine gtt - bolus iv NS x 1000 cc- patient clinically dry  4. Anemia of chronic kidney disease:   - ARANESP q weekly (TUE)   5.  C Diff positive - abx per Primary team  6. Secondary Hyperparathyroidism: PTH elevated as outpatient at 1120,  - continue calcium acetate liquid for binding   - Tube feeds continued   7. Malnutrition/Cachexia: R64. sepsis contributing     LOS: 7 Belina Mandile 5/10/201610:11 AM

## 2015-01-21 NOTE — Progress Notes (Signed)
PT Cancellation Note  Patient Details Name: Luvenia ReddenJerry Leeper MRN: [id number] DOB: 11/21/1955   Cancelled Treatment:    Reason Eval/Treat Not Completed: Fatigue/lethargy limiting ability to participate;Patient's level of consciousness (patient remains lethargic, unable to maintain arousal for adequate participation with session.  Will continue efforts as patient able to tolerate and participate.)  Jacobi Ryant H. Manson PasseyBrown, PT, DPT 01/21/2015, 3:47 PM [phone]

## 2015-01-21 NOTE — Consult Note (Signed)
Palliative Medicine Inpatient Consult Follow Up Note   Name: [name] Date: 01/21/2015 MRN: [id number]  DOB: 03/09/1956  Referring Physician: Houston SirenVivek J Sainani, MD  Palliative Care consult requested for this 59 y.o. male for goals of medical therapy in patient with ESRD on HD, T2DM and HTN. Pt presented to [facility] from Dialysis on 7 May, 2016 due to pus from permanent dialysis site. Pt workup shows Leukocytosis and PNA. Pt admitted with sepsis. Hospitalization complicated by c-diff infection. Family not at bedside.Marland Kitchen.    REVIEW OF SYSTEMS:  Patient is not able to provide ROS  CODE STATUS: Full code   PAST MEDICAL HISTORY: Past Medical History  Diagnosis Date  . Diabetes   . End stage renal disease on dialysis   . Anemia     PAST SURGICAL HISTORY: History reviewed. No pertinent past surgical history.  Vital Signs: BP 95/63 mmHg  Pulse 96  Temp(Src) 97.6 F (36.4 C) (Axillary)  Resp 18  Ht 6\' 1"  (1.854 m)  Wt 89.4 kg (197 lb 1.5 oz)  BMI 26.01 kg/m2  SpO2 96% Filed Weights   01/18/15 2015 01/20/15 1300 01/21/15 0500  Weight: 88.7 kg (195 lb 8.8 oz) 87.8 kg (193 lb 9 oz) 89.4 kg (197 lb 1.5 oz)    Estimated body mass index is 26.01 kg/(m^2) as calculated from the following:   Height as of this encounter: 6\' 1"  (1.854 m).   Weight as of this encounter: 89.4 kg (197 lb 1.5 oz).  PHYSICAL EXAM: General appearance: critically ill appearing Head: Normocephalic, without obvious abnormality, atraumatic Neck: supple, symmetrical, trachea midline and thyroid not enlarged, symmetric, no tenderness/mass/nodules Resp: clear to auscultation bilaterally Cardio: regular rate and rhythm, S1, S2 normal, no murmur, click, rub or gallop GI: soft, non-tender; bowel sounds normal; no masses,  no organomegaly and pt receiving feeding per NG tube Extremities: extremities normal, atraumatic, no cyanosis or edema Neurologic: Mental status: Alert, oriented, thought content appropriate, pt  lethargic, responsive to stimuli  LABS: CBC:    Component Value Date/Time   WBC 38.4* 01/19/2015 0915   WBC 9.6 12/26/2014 1150   HGB 9.5* 01/19/2015 0915   HGB 10.1* 12/26/2014 1150   HCT 31.0* 01/19/2015 0915   HCT 32.3* 12/26/2014 1150   PLT 307 01/19/2015 0915   PLT 144* 12/26/2014 1150   MCV 87.7 01/19/2015 0915   MCV 87 12/26/2014 1150   NEUTROABS 9.7* 12/21/2014 0504   LYMPHSABS 1.4 12/21/2014 0504   MONOABS 1.3* 12/21/2014 0504   EOSABS 0.3 12/21/2014 0504   BASOSABS 0.1 12/21/2014 0504   Comprehensive Metabolic Panel:    Component Value Date/Time   NA 138 01/19/2015 0500   NA 136 12/26/2014 1150   K 4.0 01/19/2015 0500   K 4.0 12/26/2014 1150   CL 97* 01/19/2015 0500   CL 99* 12/26/2014 1150   CO2 28 01/19/2015 0500   CO2 30 12/26/2014 1150   BUN 33* 01/19/2015 0500   BUN 33* 12/26/2014 1150   CREATININE 6.06* 01/19/2015 0500   CREATININE 6.50* 12/26/2014 1150   GLUCOSE 136* 01/19/2015 0500   GLUCOSE 126* 12/26/2014 1150   CALCIUM 8.6* 01/19/2015 0500   CALCIUM 8.5* 12/26/2014 1150   AST 29 01/17/2015 2250   AST 15 12/26/2014 1150   ALT 14* 01/17/2015 2250   ALT 9* 12/26/2014 1150   ALKPHOS 82 01/17/2015 2250   ALKPHOS 58 12/26/2014 1150   BILITOT 1.2 01/17/2015 2250   PROT 6.4* 01/17/2015 2250   PROT 7.6 12/26/2014 1150  ALBUMIN 1.9* 01/19/2015 0500   ALBUMIN 2.6* 12/26/2014 1150    IMPRESSION: Palliative Care consult requested for this 59 y.o. male for goals of medical therapy in patient with ESRD on HD, T2DM and HTN. Pt presented to Er from Dialysis on 7 May, 2016 due to pus from permanent dialysis site. Pt workup shows Leukocytosis and PNA. Pt admitted with sepsis. Hospitalization complicated by c-diff infection. Family not at bedside..   Pt currently resting in bed and receiving NG feeds.  Pt currently on pressors.  Pt mental status is similar to yesterday.  Pt to receive dialysis today.  Previous notes indicate mental status may be linked  to pt's dose of neurontin.  Will continue to monitor mental status in regards of having goals of care conversation.  Will defer to family for decision making until this time.  PLAN: 1. Cont current care 2. Full Code   More than 50% of the visit was spent in counseling/coordination of care: YES  Time Spent:25 minutes

## 2015-01-21 NOTE — Clinical Social Work Note (Signed)
Clinical Social Work Assessment  Patient Details  Name: [name] ReddenJerry Halbur MRN: [id number] Date of Birth: 05/16/1956  Date of referral:  01/21/15               Reason for consult:  Facility Placement                Permission sought to share information with:  Other (patient not able to give consent currently: patient's emergency contact: brother: Rhetta Muraerry Lafon) Permission granted to share information::     Name::        Agency::     Relationship::     Contact Information:     Housing/Transportation Living arrangements for the past 2 months:  Skilled Nursing Facility Source of Information:   (sibling) Patient Interpreter Needed:  None Criminal Activity/Legal Involvement Pertinent to Current Situation/Hospitalization:  No - Comment as needed Significant Relationships:  Siblings Lives with:  Facility Resident Do you feel safe going back to the place where you live?    Need for family participation in patient care:     Care giving concerns:  none   Social Worker assessment / plan:  Patient not able to hold conversation currently and is with ams. CSW contacted patient's brother: Aurther Lofterry via phone this afternoon. Patient's brother confirms patient is a resident at [facility] still. CSW knows patient from previous admission in April during which CSW found [facility] and [facility] a different facility. Patient's brother stated that he wanted patient at a different facility this time. CSW inquired as to what the issue was this time and patient's brother stated he had concerns regarding patient's roommate at [facility] because patient and his roommate had gotten into a verbal altercation and that he did not trust the roommate. CSW informed patient's brother that I could request to have his room moved and patient's brother was in agreement with this. CSW contacted Jomarie LongsJoseph at [facility] and he will move patient to another room with a different roommate.   Employment status:  Disabled (Comment on whether or not currently receiving Disability) Insurance information:    PT Recommendations:  Not assessed at this time Information / Referral to community resources:  Skilled Nursing Facility  Patient/Family's Response to care:  Patient's brother is in agreement with above plan.  Patient/Family's Understanding of and Emotional Response to Diagnosis, Current Treatment, and Prognosis:  Patient's brother was able to articulate that currently patient's condition is worse than previous admission.   Emotional Assessment Appearance:  Appears stated age Attitude/Demeanor/Rapport:  Unable to Assess Affect (typically observed):  Unable to Assess Orientation:  Oriented to Self Alcohol / Substance use:  Not Applicable Psych involvement (Current and /or in the community):  No (Comment)  Discharge Needs  Concerns to be addressed:  Discharge Planning Concerns Readmission within the last 30 days:  Yes Current discharge risk:  None Barriers to Discharge:  No Barriers Identified   York SpanielMonica Juliyah Mergen, LCSW 01/21/2015, 12:05 PM

## 2015-01-21 NOTE — Progress Notes (Signed)
Nutrition Follow-up  DOCUMENTATION CODES:     INTERVENTION:   EN: Recommend increasing nepro to goal rate of 6552ml/hr at this time to meet 100% of nutritional needs  NUTRITION DIAGNOSIS:  Inadequate oral intake related to lethargy/confusion as evidenced by NPO and on tube feeding    GOAL:  Goal would be for pt to tolerate tube feeding at goal rate, ongoing.    MONITOR:  EN:  Electrolyte and renal profile Digestive system Glucose profile UOP Anthropometrics   REASON FOR ASSESSMENT:   (follow-up) Enteral/tube feeding initiation and management  ASSESSMENT:  Pt with c-diff +, HD today. Continues to be lethargic unable to take po intake  Per RN, Brittney 5ml residual check times 2 today, rectal tube in place with minimal output today.  Medications: reveiwed Labs:  Electrolyte and Renal Profile:    Recent Labs Lab 01/17/15 2250 01/18/15 1720 01/19/15 0500 01/19/15 0915  BUN 67* 44* 33*  --   CREATININE 12.03* 8.11* 6.06*  --   NA 135 133* 138  --   K 4.9 4.5 4.0  --   MG  --   --   --  2.3  PHOS 7.8* 6.1* 4.9*  --    Glucose Profile:  Recent Labs  01/20/15 1712 01/21/15 0539 01/21/15 1311  GLUCAP 140* 195* 168*    Height:  Ht Readings from Last 1 Encounters:  02/01/2015 6\' 1"  (1.854 m)    Weight:  Wt Readings from Last 1 Encounters:  01/21/15 197 lb 1.5 oz (89.4 kg)    Ideal Body Weight:     Wt Readings from Last 10 Encounters:  01/21/15 197 lb 1.5 oz (89.4 kg)    BMI:  Body mass index is 26.01 kg/(m^2).  Estimated Nutritional Needs:  Kcal:  2240-2647kcals, BEE: 1697kcals  Protein:  102-128g protein (1.0-1.2g/kg)  Fluid:  UOP+108200mL  Skin:     Diet Order:  Diet NPO time specified  EDUCATION NEEDS:  Education needs no appropriate at this time   Intake/Output Summary (Last 24 hours) at 01/21/15 1326 Last data filed at 01/21/15 1300  Gross per 24 hour  Intake 2975.68 ml  Output      1 ml  Net 2974.68 ml    Last  BM:  Rectal tube in place, minimal output at this time per RN, Brittney  High level of care  Kyrell Ruacho B. Freida BusmanAllen, RD, LDN [phone] (pager)

## 2015-01-21 NOTE — Progress Notes (Signed)
Dr Cherlynn KaiserSainani requested consideration for LTAC.  Call to patient's brother Aurther Lofterry, explained rationale of LTAC, [facility]  Sheppard PentonKaren Thomas notified of referral. Jim Likeeri Suits RN CCM [facility]

## 2015-01-21 NOTE — Progress Notes (Signed)
HD tx ended 

## 2015-01-21 NOTE — Progress Notes (Signed)
Right thigh WNL. Pt Lethargic, opens eyes spontaneously for short periods or to name, follows simple commands then closes eyes again.  PERL 4, sluggish bilaterally. SR, BP ~  100 on levo 4 mcg/min. Anuric.

## 2015-01-21 NOTE — Progress Notes (Signed)
Patient remains lethargic. Arouses to voice. Denies pain. Blood pressure stable after dialysis on 674mcg/min of levophed. Afebrile. Tolerating tube feeds. NG tube clotted this afternoon but after letting water instill RN able to flush tube thsi evening. Brother visited this evening.  [place] Ice B

## 2015-01-21 NOTE — Progress Notes (Signed)
Post hd tx 

## 2015-01-21 NOTE — Progress Notes (Signed)
[facility]  Daily Progress Note   Subjective  -  Patient has improved MS and is now able to answer questions with short statements he is now noted to be C. difficile positive  Objective Filed Vitals:   01/21/15 1745 01/21/15 1800 01/21/15 1804 01/21/15 1830  BP: 106/62 128/67 119/66 117/66  Pulse: 94 97 91 96  Temp:   97.8 F (36.6 C)   TempSrc:   Oral   Resp: 18 22 19 21   Height:      Weight:   197 lb 15.6 oz (89.8 kg)   SpO2: 96% 90% 91% 90%    Intake/Output Summary (Last 24 hours) at 01/21/15 2051 Last data filed at 01/21/15 1945  Gross per 24 hour  Intake 2418.43 ml  Output   -408 ml  Net 2826.43 ml    PULM  Normal effort , no use of accessory muscles CV  No JVD, RRR Abd      No distended, nontender VASC  right groin intact no drainage or purulence, left femoral catheter clean dry and intact no evidence of stool contamination   Laboratory CBC    Component Value Date/Time   WBC 25.2* 01/21/2015 1559   WBC 9.6 12/26/2014 1150   HGB 8.2* 01/21/2015 1559   HGB 10.1* 12/26/2014 1150   HCT 28.1* 01/21/2015 1559   HCT 32.3* 12/26/2014 1150   PLT 261 01/21/2015 1559   PLT 144* 12/26/2014 1150    BMET    Component Value Date/Time   NA 138 01/21/2015 1559   NA 136 12/26/2014 1150   K 4.0 01/21/2015 1559   K 4.0 12/26/2014 1150   CL 102 01/21/2015 1559   CL 99* 12/26/2014 1150   CO2 29 01/21/2015 1559   CO2 30 12/26/2014 1150   GLUCOSE 193* 01/21/2015 1559   GLUCOSE 126* 12/26/2014 1150   BUN 54* 01/21/2015 1559   BUN 33* 12/26/2014 1150   CREATININE 6.61* 01/21/2015 1559   CREATININE 6.50* 12/26/2014 1150   CALCIUM 8.6* 01/21/2015 1559   CALCIUM 8.5* 12/26/2014 1150   GFRNONAA 8* 01/21/2015 1559   GFRNONAA 9* 12/26/2014 1150   GFRAA 10* 01/21/2015 1559   GFRAA 10* 12/26/2014 1150    Assessment/Planning:   End-stage renal disease requiring hemodialysis: Currently patient has adequate access with the Right catheter given his  overall guarded condition no indication for further evaluation regarding permanent access at this time Mental status changes: Significant improvement in his mental status compared to yesterday. It appears Dr. Thedore MinsSingh is correct, this is secondary to administration of Neurontin and should improve over the next several days.  Schnier, Latina CraverGregory G  01/21/2015, 8:51 PM

## 2015-01-21 NOTE — Clinical Social Work Note (Signed)
Patient's brother: Terry Soltero: [phone] Mura3. York SpanielMonica Bradlee Heitman MSW,LCSWA [phone]

## 2015-01-21 NOTE — Progress Notes (Signed)
[name]   PATIENT NAME: [name] ReddenJerry [name]    MR#:  [id number]  DATE OF BIRTH:  03/28/1956  SUBJECTIVE:   Still lethargic, difficult to understand but follows simple commands.  Having some loose stools.  Not tolerating anything PO presently.   REVIEW OF SYSTEMS:   Review of Systems  Constitutional: Negative for fever and chills.  HENT: Negative for congestion and tinnitus.   Eyes: Negative for blurred vision and double vision.  Respiratory: Negative for cough, shortness of breath and wheezing.   Cardiovascular: Negative for chest pain, orthopnea and PND.  Gastrointestinal: Negative for nausea, vomiting, abdominal pain and diarrhea.  Genitourinary: Negative for dysuria and hematuria.  Neurological: Positive for weakness. Negative for dizziness, sensory change and focal weakness.  All other systems reviewed and are negative.  Nutrition: Tube Feeds Tolerating PT: No Tolerating diet: No due to encephalopathy.   DRUG ALLERGIES:   Allergies  Allergen Reactions  . Penicillins Hives  . Penicillin G Other (See Comments)    VITALS:  Blood pressure 81/54, pulse 93, temperature 97.9 F (36.6 C), temperature source Oral, resp. rate 18, height 6\' 1"  (1.854 m), weight 89.4 kg (197 lb 1.5 oz), SpO2 93 %.  PHYSICAL EXAMINATION:  GENERAL:  59 y.o.-year-old patient lethargic lying in the bed with no acute distress.  EYES: Pupils equal, round, reactive to light and accommodation. No scleral icterus. Extraocular muscles intact.  HEENT: Head atraumatic, normocephalic. Dry Oral Mucosa. NG tube in place.  NECK:  Supple, no jugular venous distention. No thyroid enlargement, no tenderness.  LUNGS: Normal breath sounds bilaterally, no wheezing, rales,rhonchi or crepitation. No use of accessory muscles of respiration.  CARDIOVASCULAR: S1, S2 normal. No murmurs, rubs, or gallops.  ABDOMEN: Soft, nontender, nondistended. Bowel sounds present. No  organomegaly or mass.  EXTREMITIES: No pedal edema, cyanosis, or clubbing.  NEUROLOGIC: remains lethargic, encephalopathic, follows simple commands. Slurred speech. PSYCHIATRIC: Encephalopathic but alert.  SKIN: No obvious rash, lesion, or ulcer.    LABORATORY PANEL:   CBC  Recent Labs Lab 01/18/15 1720 01/19/15 0915  WBC 47.5* 38.4*  HGB 8.8* 9.5*  HCT 29.4* 31.0*  PLT 298 307   ------------------------------------------------------------------------------------------------------------------  Chemistries   Recent Labs Lab 01/17/15 2250 01/18/15 1720 01/19/15 0500 01/19/15 0915  NA 135 133* 138  --   K 4.9 4.5 4.0  --   CL 96* 96* 97*  --   CO2 25 27 28   --   GLUCOSE 128* 147* 136*  --   BUN 67* 44* 33*  --   CREATININE 12.03* 8.11* 6.06*  --   CALCIUM 8.5* 8.3* 8.6*  --   MG  --   --   --  2.3  AST 29  --   --   --   ALT 14*  --   --   --   ALKPHOS 82  --   --   --   BILITOT 1.2  --   --   --    ------------------------------------------------------------------------------------------------------------------  Cardiac Enzymes No results for input(s): TROPONINI in the last 168 hours. ------------------------------------------------------------------------------------------------------------------  RADIOLOGY:  No results found.   ASSESSMENT AND PLAN:   * Dialysis access site infection - BC at outpatient dialysis were + & growing Klebsiella.   - s/p Perm Cath removal by Dr schnier on 01/21/2015 - cont. Levaquin for Klebsiella for now.  - BC from 02/10/2015 & Cath tip cx 01/23/2015 neg so far - s/p temp. Cath placement.   *  End-stage renal disease on hemodialysis. - Nephro following and cont. HD as per them. - pt. To have HD today.    * Sepsis - likely due to pneumonia as seen on CT chest/dialysis access infection.   - afebrile, but remains on levophed.  Keep MAP > 55.  - cont. Empiric abx w/ Vanco, Levaquin, Flagyl.  - cultures (-) so far.   *  AMS/Encephalopathy - thought to be related to uremia/sepsis and slightly improved since past 2 days.  - CT head (-).  Does have a leukocytosis and CT chest suggestive of pneumonia.  ?? Metabolic encephalopathy from sepsis and slowly improving as pt. Follows simple commands.  - cont. IV Vanc, Levaquin, Flagyl - appreciate Neuro eval and no further intervention and cont. Current care.  No need for EEG.  - will follow.   * Leukocytosis - ?? Related to pneumonia/sepsis.  - follow WBC count w/ IV abx therapy.   * Diabetes type II w/out complication.  - BS stable now cont. SSI.   * Clostridium difficile colitis - diarrhea improved as pt. Is off laxatives now.  - cont. po flagyl.   * Essential Hypertension. - bp on the lower side - hold bp meds  * DM Neuropathy - on Neurontin and dose lowered and ?? That's affective pt's mental status.   * COPD - no acute exacerbation.  - cont. Spiriva.   * Depression - cont. Cymbalta  * Hypothyroidism - cont. Synthroid.   * Nutrition - s/p NG tube and tolerating tube feeds for now.   CODE STATUS: Full code.    All the records are reviewed and case discussed with Care Management/Social Workerr. Management plans discussed with the patient, family and they are in agreement.  CODE STATUS: full code  TOTAL Critical care TIME TAKING CARE OF THIS PATIENT: 30 mins   Maebell Lyvers J M.D on 01/21/2015 at 2:29 PM  Between 7am to 6pm - Pager - [phone]  After 6pm go to [email or web address] - password [email or web address] [facility]  [facility]  Office  [phone]  CC: Primary care physician; No primary care provider on file.

## 2015-01-22 LAB — RENAL FUNCTION PANEL
ANION GAP: 10 (ref 5–15)
Albumin: 1.8 g/dL — ABNORMAL LOW (ref 3.5–5.0)
BUN: 42 mg/dL — AB (ref 6–20)
CALCIUM: 9 mg/dL (ref 8.9–10.3)
CO2: 29 mmol/L (ref 22–32)
CREATININE: 5.28 mg/dL — AB (ref 0.61–1.24)
Chloride: 103 mmol/L (ref 101–111)
GFR calc Af Amer: 13 mL/min — ABNORMAL LOW (ref >60)
GFR calc non Af Amer: 11 mL/min — ABNORMAL LOW (ref >60)
GLUCOSE: 194 mg/dL — AB (ref 65–99)
PHOSPHORUS: 2.8 mg/dL (ref 2.5–4.6)
Potassium: 3.4 mmol/L — ABNORMAL LOW (ref 3.5–5.1)
Sodium: 142 mmol/L (ref 135–145)

## 2015-01-22 LAB — CULTURE, BLOOD (ROUTINE X 2): Culture: NO GROWTH

## 2015-01-22 LAB — CBC
HCT: 26.3 % — ABNORMAL LOW (ref 40.0–52.0)
HEMATOCRIT: 27.9 % — AB (ref 40.0–52.0)
HEMOGLOBIN: 8.4 g/dL — AB (ref 13.0–18.0)
Hemoglobin: 8 g/dL — ABNORMAL LOW (ref 13.0–18.0)
MCH: 26.6 pg (ref 26.0–34.0)
MCH: 26.5 pg (ref 26.0–34.0)
MCHC: 30.2 g/dL — ABNORMAL LOW (ref 32.0–36.0)
MCHC: 30.3 g/dL — ABNORMAL LOW (ref 32.0–36.0)
MCV: 88.2 fL (ref 80.0–100.0)
MCV: 87.7 fL (ref 80.0–100.0)
Platelets: 189 10*3/uL (ref 150–440)
Platelets: 178 10*3/uL (ref 150–440)
RBC: 3.17 MIL/uL — AB (ref 4.40–5.90)
RBC: 3 MIL/uL — AB (ref 4.40–5.90)
RDW: 18.8 % — ABNORMAL HIGH (ref 11.5–14.5)
RDW: 18.7 % — ABNORMAL HIGH (ref 11.5–14.5)
WBC: 23.1 10*3/uL — ABNORMAL HIGH (ref 3.8–10.6)
WBC: 19.8 10*3/uL — ABNORMAL HIGH (ref 3.8–10.6)

## 2015-01-22 LAB — GLUCOSE, CAPILLARY
GLUCOSE-CAPILLARY: 163 mg/dL — AB (ref 70–99)
GLUCOSE-CAPILLARY: 196 mg/dL — AB (ref 70–99)
GLUCOSE-CAPILLARY: 164 mg/dL — AB (ref 70–99)
Glucose-Capillary: 119 mg/dL — ABNORMAL HIGH (ref 70–99)
Glucose-Capillary: 149 mg/dL — ABNORMAL HIGH (ref 70–99)
Glucose-Capillary: 218 mg/dL — ABNORMAL HIGH (ref 70–99)

## 2015-01-22 MED ORDER — SODIUM CHLORIDE 0.9 % IV BOLUS (SEPSIS)
1000.0000 mL | Freq: Once | INTRAVENOUS | Status: AC
  Administered 2015-01-22: 1000 mL via INTRAVENOUS

## 2015-01-22 MED ORDER — INSULIN ASPART 100 UNIT/ML ~~LOC~~ SOLN
0.0000 [IU] | SUBCUTANEOUS | Status: DC
  Administered 2015-01-22: 2 [IU] via SUBCUTANEOUS
  Administered 2015-01-22: 3 [IU] via SUBCUTANEOUS
  Administered 2015-01-23 (×2): 2 [IU] via SUBCUTANEOUS
  Administered 2015-01-23: 3 [IU] via SUBCUTANEOUS
  Administered 2015-01-23: 2 [IU] via SUBCUTANEOUS
  Administered 2015-01-23 – 2015-01-24 (×2): 1 [IU] via SUBCUTANEOUS
  Administered 2015-01-24 (×3): 2 [IU] via SUBCUTANEOUS
  Administered 2015-01-24: 1 [IU] via SUBCUTANEOUS
  Filled 2015-01-22: qty 2
  Filled 2015-01-22: qty 1
  Filled 2015-01-22 (×6): qty 2
  Filled 2015-01-22: qty 1
  Filled 2015-01-22: qty 3
  Filled 2015-01-22: qty 1

## 2015-01-22 MED ORDER — INSULIN ASPART 100 UNIT/ML ~~LOC~~ SOLN
SUBCUTANEOUS | Status: AC
Start: 2015-01-22 — End: 2015-01-22
  Filled 2015-01-22: qty 3

## 2015-01-22 NOTE — Progress Notes (Signed)
Inpatient Diabetes Program Recommendations  AACE/ADA: New Consensus Statement on Inpatient Glycemic Control (2013)  Target Ranges:  Prepandial:   less than 140 mg/dL      Peak postprandial:   less than 180 mg/dL (1-2 hours)      Critically ill patients:  140 - 180 mg/dL   Results for Luvenia ReddenBNEY, Kwali (MRN [id number]) as of 01/22/2015 10:57  Ref. Range 01/21/2015 05:39 01/21/2015 13:11 01/21/2015 16:14 01/21/2015 21:18 01/21/2015 23:38 01/22/2015 04:32 01/22/2015 07:15  Glucose-Capillary Latest Ref Range: 70-99 mg/dL 782195 (H) 956168 (H) 213149 (H) 140 (H) 141 (H) 163 (H) 149 (H)   Diabetes history: DM2 Outpatient Diabetes medications: Levemir 18 untis QHS, Novolog 2-10 units TID with meals, Glipizide 10 mg BID Current orders for Inpatient glycemic control: None  Inpatient Diabetes Program Recommendations Correction (SSI): Please consider using Glycemic Control Order Set and order Novolog sensitive correction Q4H.  Thanks, Orlando PennerMarie Dreyton Roessner, RN, MSN, CCRN, CDE Diabetes Coordinator Inpatient Diabetes Program [phone] (Team Pager from 8am to 5pm) [phone] (AP office) [phone] [facility](MC office)

## 2015-01-22 NOTE — Progress Notes (Signed)
[facility] does not have dialysis bed available today, and persumed no bed availability over the next couple of days.  [facility], per Sue Lushndrea they are offering a bed for patient.  Call to patient's brother who is Management consultantdecision maker.  He states [place] and would not be able to visit his brother at [facility].  We agreed to touch base on Friday, allowing for an unexpected discharge from [facility] or possible transfer to [facility], [place]rea Pike aware.  Requested order for Hep B, last Hep B was obtained May 2015, LTAC requires Hep B result within 30 days of admission to their facility. Jim Likeeri Gorden Stthomas RN CCM [facility]

## 2015-01-22 NOTE — Progress Notes (Signed)
Nutrition Follow-up  DOCUMENTATION CODES:     INTERVENTION:  EN: Recommend continuing at goal rate of 39m/hr to meet 100% nutritional needs. RN Tram reports NG tube clogged this am, TF currently on hold for unclogging tube per protocol. Coordination of Care: per MD note, pt may be appropriate for SLP evaluation, will follow poc  NUTRITION DIAGNOSIS:  Inadequate oral intake related to lethargy/confusion as evidenced by estimated needs, meal completion < 25% but being addressed with TF  GOAL:  Goal for pt to tolerate initiation of TF at goal rate within 24 hr, goal met. New goal tolerate at goal.  MONITOR: EN Electrolyte and renal profile Glucose profile Digestive system UOP Anthropometrics  REASON FOR ASSESSMENT:   (follow-up) Enteral/tube feeding initiation and management  ASSESSMENT:  Pt becoming more alert per MD note. Per note, pt awaiting LTAC evaluation.  Medications: MV, flagyl, protonix, levophed Labs: Electrolyte and Renal Profile:    Recent Labs Lab 01/18/15 1720 01/19/15 0500 01/19/15 0915 01/21/15 1559 01/22/15 0054  BUN 44* 33*  --  54* 42*  CREATININE 8.11* 6.06*  --  6.61* 5.28*  NA 133* 138  --  138 142  K 4.5 4.0  --  4.0 3.4*  MG  --   --  2.3  --   --   PHOS 6.1* 4.9*  --   --  2.8   Glucose Profile:  Recent Labs  01/22/15 0432 01/22/15 0715 01/22/15 1203  GLUCAP 163* 149* 196*   Protein Profile:  Recent Labs Lab 01/18/15 1720 01/19/15 0500 01/22/15 0054  ALBUMIN 1.9* 1.9* 1.8*   Height:  Ht Readings from Last 1 Encounters:  01/27/2015 6' 1"  (1.854 m)    Weight:  Wt Readings from Last 1 Encounters:  01/22/15 201 lb 4.5 oz (91.3 kg)    Ideal Body Weight:     Wt Readings from Last 10 Encounters:  01/22/15 201 lb 4.5 oz (91.3 kg)    BMI:  Body mass index is 26.56 kg/(m^2).  Estimated Nutritional Needs:  Kcal:  2240-2647kcals, BEE: 1697kcals  Protein:  102-128g protein (1.0-1.2g/kg)  Fluid:   UOP+10051m Diet Order:  Diet NPO time specified  EDUCATION NEEDS:  Education needs no appropriate at this time   Intake/Output Summary (Last 24 hours) at 01/22/15 1300 Last data filed at 01/22/15 0930  Gross per 24 hour  Intake 1368.85 ml  Output   -409 ml  Net 1777.85 ml    Last BM:  5/11 loose out rectal tube  HIGH Care Level  AlDwyane LuoRD, LDN Pager [phone]

## 2015-01-22 NOTE — Progress Notes (Signed)
Subjective:   Still with altered mental status but appears to be more conversive. Neurontin discontinued BP low after pressors were held. Restarted   Objective:  Vital signs in last 24 hours:  Temp:  [97.6 F (36.4 C)-97.9 F (36.6 C)] 97.7 F (36.5 C) (05/11 0800) Pulse Rate:  [88-104] 93 (05/11 0930) Resp:  [13-25] 14 (05/11 0930) BP: (70-128)/(47-77) 76/50 mmHg (05/11 0930) SpO2:  [90 %-100 %] 96 % (05/11 0930) Weight:  [89.4 kg (197 lb 1.5 oz)-91.3 kg (201 lb 4.5 oz)] 91.3 kg (201 lb 4.5 oz) (05/11 0238)  Weight change: 1.6 kg (3 lb 8.4 oz) Filed Weights   01/21/15 1507 01/21/15 1804 01/22/15 0238  Weight: 89.4 kg (197 lb 1.5 oz) 89.8 kg (197 lb 15.6 oz) 91.3 kg (201 lb 4.5 oz)    Intake/Output: I/O last 3 completed shifts: In: 3134.4 [I.V.:128.7; NG/GT:1705.8; IV Piggyback:1299.9] Out: -408 [Stool:1]   Intake/Output this shift:  Total I/O In: 5.7 [I.V.:5.7] Out: -   Physical Exam: General: Critically ill, lethargic  Head: dry oral mucosal membranes  Eyes: Muddy sclera  Neck: Supple, trachea midline  Lungs:  Diminished on right   Heart: Regular rate and rhythm  Abdomen:  Mild guarding  Extremities:  no peripheral edema.  Neurologic: Not oriented, lethargic.   Skin: No lesions, decreased turgor  Access: Right femoral trialysis catheter 5/6    Basic Metabolic Panel:  Recent Labs Lab 01/17/15 0037 01/17/15 2250 01/18/15 1720 01/19/15 0500 01/19/15 0915 01/21/15 1559 01/22/15 0054  NA 135 135 133* 138  --  138 142  K 4.5 4.9 4.5 4.0  --  4.0 3.4*  CL 98* 96* 96* 97*  --  102 103  CO2 24 25 27 28   --  29 29  GLUCOSE 122* 128* 147* 136*  --  193* 194*  BUN 60* 67* 44* 33*  --  54* 42*  CREATININE 11.33* 12.03* 8.11* 6.06*  --  6.61* 5.28*  CALCIUM 8.5* 8.5* 8.3* 8.6*  --  8.6* 9.0  MG  --   --   --   --  2.3  --   --   PHOS 7.3* 7.8* 6.1* 4.9*  --   --  2.8    Liver Function Tests:  Recent Labs Lab 01/17/15 0037 01/17/15 2250 01/18/15 1720  01/19/15 0500 01/22/15 0054  AST  --  29  --   --   --   ALT  --  14*  --   --   --   ALKPHOS  --  82  --   --   --   BILITOT  --  1.2  --   --   --   PROT  --  6.4*  --   --   --   ALBUMIN 2.3* 2.2*  2.2* 1.9* 1.9* 1.8*   No results for input(s): LIPASE, AMYLASE in the last 168 hours. No results for input(s): AMMONIA in the last 168 hours.  CBC:  Recent Labs Lab 01/18/15 1720 01/19/15 0915 01/21/15 1559 01/22/15 0054 01/22/15 0649  WBC 47.5* 38.4* 25.2* 23.1* 19.8*  HGB 8.8* 9.5* 8.2* 8.4* 8.0*  HCT 29.4* 31.0* 28.1* 27.9* 26.3*  MCV 87.7 87.7 88.7 88.2 87.7  PLT 298 307 261 189 178    Cardiac Enzymes: No results for input(s): CKTOTAL, CKMB, CKMBINDEX, TROPONINI in the last 168 hours.  BNP: Invalid input(s): POCBNP  CBG:  Recent Labs Lab 01/21/15 1614 01/21/15 2118 01/21/15 2338 01/22/15 0432 01/22/15 0715  GLUCAP  149* 140* 141* 163* 149*    Microbiology: Results for orders placed or performed during the hospital encounter of 01/19/2015  Culture, blood (routine x 2)     Status: None   Collection Time: 01/13/2015 12:57 PM  Result Value Ref Range Status   Specimen Description BLOOD  Final   Special Requests BLOOD  Final   Culture NO GROWTH 5 DAYS  Final   Report Status 01/19/2015 FINAL  Final  Culture, blood (routine x 2)     Status: None   Collection Time: 01/30/2015 12:57 PM  Result Value Ref Range Status   Specimen Description BLOOD  Final   Special Requests BLOOD  Final   Culture NO GROWTH 5 DAYS  Final   Report Status 01/19/2015 FINAL  Final  Cath Tip Culture     Status: None   Collection Time: 01/23/2015  5:57 PM  Result Value Ref Range Status   Specimen Description CATH TIP  Final   Special Requests NONE  Final   Culture NO GROWTH 3 DAYS  Final   Report Status 01/18/2015 FINAL  Final  C difficile quick scan w PCR reflex [facility])     Status: None   Collection Time: 01/15/15 11:02 AM  Result Value Ref Range Status   C Diff antigen POSITIVE  Final    C Diff toxin NEGATIVE  Final   C Diff interpretation   Final    Positive for toxigenic C. difficile, active toxin production not detected. Patient has toxigenic C. difficile organisms present in the bowel, but toxin was not detected. The patient may be a carrier or the level of toxin in the sample was below the limit  of detection. This information should be used in conjunction with the patient's clinical history when deciding on possible therapy.     Comment: CRITICAL RESULT CALLED TO, READ BACK BY AND VERIFIED WITH: Zettie Cooley @ 1400 01/15/15 BGB   Clostridium Difficile by PCR     Status: Abnormal   Collection Time: 01/15/15 11:02 AM  Result Value Ref Range Status   C difficile by pcr POSITIVE (A) NEGATIVE Final  Culture, blood (routine x 2)     Status: None (Preliminary result)   Collection Time: 01/17/15 11:03 PM  Result Value Ref Range Status   Specimen Description BLOOD  Final   Special Requests BLOOD  Final   Culture NO GROWTH 4 DAYS  Final   Report Status PENDING  Incomplete  Culture, blood (routine x 2)     Status: None (Preliminary result)   Collection Time: 01/17/15 11:03 PM  Result Value Ref Range Status   Specimen Description BLOOD  Final   Special Requests BLOOD  Final   Culture NO GROWTH 4 DAYS  Final   Report Status PENDING  Incomplete    Coagulation Studies: No results for input(s): LABPROT, INR in the last 72 hours.  Urinalysis: No results for input(s): COLORURINE, LABSPEC, PHURINE, GLUCOSEU, HGBUR, BILIRUBINUR, KETONESUR, PROTEINUR, UROBILINOGEN, NITRITE, LEUKOCYTESUR in the last 72 hours.  Invalid input(s): APPERANCEUR    Imaging: No results found.   Medications:   . norepinephrine (LEVOPHED) Adult infusion Stopped (01/22/15 0102)   . calcium acetate (Phos Binder)  1,334 mg Oral TID WC  . DULoxetine  60 mg Oral Daily  . epoetin (EPOGEN/PROCRIT) injection  10,000 Units Subcutaneous Weekly  . ezetimibe  10 mg Oral Daily  . famotidine  10 mg Oral  Daily  . feeding supplement (NEPRO CARB STEADY)  1,000 mL Per Tube  Q24H  . fluticasone  1 spray Each Nare Daily  . levofloxacin (LEVAQUIN) IV  500 mg Intravenous Q48H  . levothyroxine  125 mcg Oral QAC breakfast  . lipase/protease/amylase  2 capsule Oral Once   And  . sodium bicarbonate  650 mg Oral Once  . metroNIDAZOLE  500 mg Oral 3 times per day  . montelukast  10 mg Oral QHS  . multivitamin  1 tablet Oral Daily  . oxybutynin  5 mg Oral BID  . pantoprazole sodium  40 mg Per Tube Daily  . tiotropium  18 mcg Inhalation Daily  . vancomycin  1,000 mg Intravenous Q T,Th,Sa-HD   acetaminophen **OR** acetaminophen, hydrALAZINE, ondansetron **OR** ondansetron (ZOFRAN) IV, oxyCODONE  Assessment/ Plan:  59 y.o. black male  With asthma, diabetes mellitus type II, diabetic retinopathy, hypertension, gout, hyperlipidemia, and morbid obesity and ESRD   [facility] TTS first shift.   1. End stage renal disease: with infected catheter. Now removed.  Will need tunnelled catheter eventually. Cultures from [facility] show 5/3 Kebsiella: placed on levofoxacin Tolerated HD ok on 5/14. treatment stopped early due to poor BFR Packed with tPA  2. Metabolic Encephalopathy:G93.41   - likely sepsis, or medication effect - neurontin  3. Right upper lobe  acute pneumonia with sepsis: J18.9. I45.9 - multiple antibiotics  - norepinephrine gtt - bolus iv NS x 1000 cc again today - patient clinically dry  4. Anemia of chronic kidney disease:   - ARANESP q weekly (TUE)   5. C Diff positive - abx per Primary team  6. Secondary Hyperparathyroidism: PTH elevated as outpatient at 1120,  - continue calcium acetate liquid for binding   - Tube feeds continued       LOS: 8 Sixto Bowdish 5/11/20169:54 AM

## 2015-01-22 NOTE — Progress Notes (Signed)
Patient continues to be lethargic, arouse to voice but sleeps between care.  Denies pain.  Vital signs stable, except hypotensive requiring levophed gtt, attempted to hold but needed restart multiple times, currently at 232mcg/kg/min.  FSBS hyperglycemic, sensitive sliding scale insulin initiated.  Tube feeding held as NG NG x1 with improvement after declogging protocol.  Dr [facility]ly[facility]ainani [facility], states "okay to draw it with routine lab tomorrow."   Patient currently resting in no apparent distress.  See SCM for further details.

## 2015-01-22 NOTE — Progress Notes (Signed)
RN notified Dr Cherlynn KaiserSainani with patient fsbs 218, patient getting tube feeding but has no sliding scale coverage.  MD ordered sensitive sliding scale.

## 2015-01-22 NOTE — Progress Notes (Signed)
[facility]  Daily Progress Note   Subjective  -  Patient's improved MS is not progressing he is still only able to answer questions with short statements he is now noted to be C. difficile positive  Objective Filed Vitals:   01/22/15 1600 01/22/15 1700 01/22/15 1800 01/22/15 1900  BP: 98/65 85/55 84/63  102/63  Pulse: 98 102 101 103  Temp: 98.3 F (36.8 C)     TempSrc: Oral     Resp: 16 15 18 20   Height:      Weight:      SpO2: 97% 100% 98% 96%    Intake/Output Summary (Last 24 hours) at 01/22/15 2135 Last data filed at 01/22/15 1930  Gross per 24 hour  Intake 2258.48 ml  Output      0 ml  Net 2258.48 ml    PULM  Normal effort , no use of accessory muscles CV  No JVD, RRR Abd      No distended, nontender VASC  right groin intact no drainage or purulence, left femoral catheter clean dry and intact no evidence of stool contamination   Laboratory CBC    Component Value Date/Time   WBC 19.8* 01/22/2015 0649   WBC 9.6 12/26/2014 1150   HGB 8.0* 01/22/2015 0649   HGB 10.1* 12/26/2014 1150   HCT 26.3* 01/22/2015 0649   HCT 32.3* 12/26/2014 1150   PLT 178 01/22/2015 0649   PLT 144* 12/26/2014 1150    BMET    Component Value Date/Time   NA 142 01/22/2015 0054   NA 136 12/26/2014 1150   K 3.4* 01/22/2015 0054   K 4.0 12/26/2014 1150   CL 103 01/22/2015 0054   CL 99* 12/26/2014 1150   CO2 29 01/22/2015 0054   CO2 30 12/26/2014 1150   GLUCOSE 194* 01/22/2015 0054   GLUCOSE 126* 12/26/2014 1150   BUN 42* 01/22/2015 0054   BUN 33* 12/26/2014 1150   CREATININE 5.28* 01/22/2015 0054   CREATININE 6.50* 12/26/2014 1150   CALCIUM 9.0 01/22/2015 0054   CALCIUM 8.5* 12/26/2014 1150   GFRNONAA 11* 01/22/2015 0054   GFRNONAA 9* 12/26/2014 1150   GFRAA 13* 01/22/2015 0054   GFRAA 10* 12/26/2014 1150    Assessment/Planning:   End-stage renal disease requiring hemodialysis: Currently patient has adequate access with the Right catheter given  his overall guarded condition no indication for further evaluation regarding permanent access at this time Mental status changes:Improvement is now at a plateau in his mental status.  Makaylia Hewett, Latina CraverGregory G  01/22/2015, 9:35 PM

## 2015-01-22 NOTE — Progress Notes (Signed)
[facility] [place] [facility]   PATIENT NAME: [name] ReddenJerry [name]    MR#:  [id number]  DATE OF BIRTH:  01/21/1956  SUBJECTIVE:   Remains lethargic, difficult to understand but follows simple commands.  Diarrhea improved.  Remains on low dose vasopressors. Awaiting LTAC eval.   REVIEW OF SYSTEMS:   Review of Systems  Constitutional: Negative for fever and chills.  HENT: Negative for congestion and tinnitus.   Eyes: Negative for blurred vision and double vision.  Respiratory: Negative for cough, shortness of breath and wheezing.   Cardiovascular: Negative for chest pain, orthopnea and PND.  Gastrointestinal: Negative for nausea, vomiting, abdominal pain and diarrhea.  Genitourinary: Negative for dysuria and hematuria.  Neurological: Negative for dizziness, sensory change and focal weakness.  All other systems reviewed and are negative.  Nutrition: Tube Feeds Tolerating PT: No Tolerating diet: No due to encephalopathy.   DRUG ALLERGIES:   Allergies  Allergen Reactions  . Penicillins Hives  . Penicillin G Other (See Comments)    VITALS:  Blood pressure 76/50, pulse 93, temperature 97.7 F (36.5 C), temperature source Oral, resp. rate 14, height 6\' 1"  (1.854 m), weight 91.3 kg (201 lb 4.5 oz), SpO2 96 %.  PHYSICAL EXAMINATION:  GENERAL:  59 y.o.-year-old patient lethargic lying in the bed with no acute distress.  EYES: Pupils equal, round, reactive to light and accommodation. No scleral icterus. Extraocular muscles intact.  HEENT: Head atraumatic, normocephalic. Dry Oral Mucosa. NG tube in place.  NECK:  Supple, no jugular venous distention. No thyroid enlargement, no tenderness.  LUNGS: Normal breath sounds bilaterally, no wheezing, rales,rhonchi or crepitation. No use of accessory muscles of respiration.  CARDIOVASCULAR: S1, S2 normal. No murmurs, rubs, or gallops.  ABDOMEN: Soft, nontender, nondistended. Bowel sounds present. No organomegaly or  mass.  EXTREMITIES: No pedal edema, cyanosis, or clubbing.  NEUROLOGIC: remains lethargic, encephalopathic, follows simple commands. Slurred speech. PSYCHIATRIC: Encephalopathic but alert.  SKIN: No obvious rash, lesion, or ulcer.    LABORATORY PANEL:   CBC  Recent Labs Lab 01/22/15 0054 01/22/15 0649  WBC 23.1* 19.8*  HGB 8.4* 8.0*  HCT 27.9* 26.3*  PLT 189 178   ------------------------------------------------------------------------------------------------------------------  Chemistries   Recent Labs Lab 01/17/15 2250  01/19/15 0915 01/21/15 1559 01/22/15 0054  NA 135  < >  --  138 142  K 4.9  < >  --  4.0 3.4*  CL 96*  < >  --  102 103  CO2 25  < >  --  29 29  GLUCOSE 128*  < >  --  193* 194*  BUN 67*  < >  --  54* 42*  CREATININE 12.03*  < >  --  6.61* 5.28*  CALCIUM 8.5*  < >  --  8.6* 9.0  MG  --   --  2.3  --   --   AST 29  --   --   --   --   ALT 14*  --   --   --   --   ALKPHOS 82  --   --   --   --   BILITOT 1.2  --   --   --   --   < > = values in this interval not displayed. ------------------------------------------------------------------------------------------------------------------  Cardiac Enzymes No results for input(s): TROPONINI in the last 168 hours. ------------------------------------------------------------------------------------------------------------------  RADIOLOGY:  No results found.   ASSESSMENT AND PLAN:   * Dialysis access site infection - BC  as outpatient dialysis were + & growing Klebsiella.   - s/p Perm Cath removal by Dr schnier on 02/06/2015 - cont. Levaquin for Klebsiella for now.  - BC from 02/05/2015 & Cath tip cx 01/25/2015 neg so far - s/p temp. Cath placement.  * End-stage renal disease on hemodialysis. - Nephro following and cont. HD as per them. - had HD yesterday and tolerated it well.   * Sepsis - likely due to pneumonia as seen on CT chest also dialysis access infection.   - afebrile, but remains on  levophed.  Keep MAP > 55.  - cont. Empiric abx w/ Vanco, Levaquin, Flagyl.  - cultures (-) so far.   * AMS/Encephalopathy - thought to be related to uremia/sepsis and slightly improved since past 2 days.  - CT head (-).  Does have a leukocytosis and CT chest suggestive of pneumonia.  ?? Metabolic encephalopathy from sepsis and slowly improving - cont. IV Vanc, Levaquin, Flagyl - appreciate Neuro eval and no further intervention and cont. Current care.  No need for EEG.   * Leukocytosis - ?? Related to pneumonia/sepsis.  - trending down w/ IV abx therapy.    * Diabetes type II w/out complication.  - BS stable now cont. SSI.   * Clostridium difficile colitis - diarrhea improved as pt. Is off laxatives now.  - cont. po flagyl.   * Essential Hypertension. - bp on the lower side - hold bp meds  * DM Neuropathy - cont. Neurontin   * COPD - no acute exacerbation.  - cont. Spiriva.   * Depression - cont. Cymbalta  * Hypothyroidism - cont. Synthroid.   * Nutrition - s/p NG tube and tolerating tube feeds for now. - a bit more awake today and will get speech eval if possible.   Pt. Has multiple chronic issues and would be a good candidate for LTAC.  Discussed w/ CM and they are evaluating pt. For LTAC.  Possible d/c to LTAC if o.k. With family and insurance approval.    CODE STATUS: Full code.    All the records are reviewed and case discussed with Care Management/Social Workerr. Management plans discussed with the patient, family and they are in agreement.  CODE STATUS: full code  TOTAL Critical care TIME TAKING CARE OF THIS PATIENT: 30 mins   Hilda LiasSAINANI,VIVEK J [name] on 01/22/2015 at 11:56 AM  Between 7am to 6pm - Pager - [phone]  After 6pm go to [email or web address] - password [email or web address] [facility]  [facility]  Office  [phone]  CC: Primary care physician; No primary care provider on file.

## 2015-01-22 NOTE — Progress Notes (Signed)
Physical Therapy Treatment Patient Details Name: [name] MRN: [id number] DOB: 09/10/1956 Today's Date: 01/22/2015    History of Present Illness 59 yo male with onset of sepsis in dialysis port and confusion after being at HD session and coming to hosp for purulent drainage.  PMHx:  HTN, ESRD, PN, HLD, DM     PT Comments    Patient remains grossly lethargic, but improved from previous treatment attempts.  Consistently opens eyes and attempts to follow simple, one step commands with constant, step by step verbal cuing.  Spontaneously closes eyes again after 3-4 seconds if not constantly encouraged.  All extremities globally weak and deconditioned demonstrating strength only 2-/5 throughout (L LE not tested secondary to temp fem cath).  Anticipate very slow progression of strength and mobility; anticipate extended rehab course.  Follow Up Recommendations  LTACH;SNF     Equipment Recommendations       Recommendations for Other Services       Precautions / Restrictions Precautions Precautions: Fall Precaution Comments: L temp fem cath, enteric iso    Mobility  Bed Mobility   deferred secondary to temp fem cath                 Transfers   deferred secondary to temp fem cath                   Ambulation/Gait   deferred secondary to temp fem cath                Stairs            Wheelchair Mobility    Modified Rankin (Stroke Patients Only)       Balance                                    Cognition                            Exercises Other Exercises Other Exercises: Bilat UE supine therex, 1x10, act assist ROM: shoulder flex/ext, shoulder IR/ER, elbow flex/ext, forearm pronation/supination, finger flex/ext.  Globally weak and deconditioned, strength at least 2-/5 throughout; moderate myoclonic jerking noted with active efforts. Other Exercises: R LE supine therex, 1x10, act assist/pasive ROM: ankle pumps with heel  cord stretching, heel slides, hip abduct/adduct.  Strength at least 2-/5 throughout. L LE deferred secondary to temp fem cath.    General Comments        Pertinent Vitals/Pain Pain Assessment: Faces Pain Score: 2  Faces Pain Scale: Hurts a little bit Pain Location: occasional grimacing with movement of extremities; unable to describe, localize    Home Living                      Prior Function            PT Goals (current goals can now be found in the care plan section) Acute Rehab PT Goals Patient Stated Goal: none stated PT Goal Formulation: With patient Time For Goal Achievement: 01/30/15 Potential to Achieve Goals: Fair Progress towards PT goals: Not progressing toward goals - comment (limited ability to actively participate/progress secondary to lethargy; mobility limited by L temp fem cath)    Frequency  Min 2X/week    PT Plan Current plan remains appropriate;Discharge plan needs to be updated    Co-evaluation  End of Session   Activity Tolerance: Patient limited by lethargy Patient left: in bed;with call bell/phone within reach;with bed alarm set     Time: [id number] PT Time Calculation (min) (ACUTE ONLY): 15 min  Charges:  $Therapeutic Exercise: 8-22 mins                    G Codes:      [name], PT, DPT 01/22/2015, 2:53 PM [phone]

## 2015-01-23 ENCOUNTER — Inpatient Hospital Stay: Payer: Medicare Other

## 2015-01-23 LAB — RENAL FUNCTION PANEL
ALBUMIN: 1.7 g/dL — AB (ref 3.5–5.0)
Anion gap: 11 (ref 5–15)
BUN: 58 mg/dL — AB (ref 6–20)
CO2: 28 mmol/L (ref 22–32)
CREATININE: 6.04 mg/dL — AB (ref 0.61–1.24)
Calcium: 9 mg/dL (ref 8.9–10.3)
Chloride: 99 mmol/L — ABNORMAL LOW (ref 101–111)
GFR calc Af Amer: 11 mL/min — ABNORMAL LOW (ref >60)
GFR calc non Af Amer: 9 mL/min — ABNORMAL LOW (ref >60)
GLUCOSE: 310 mg/dL — AB (ref 65–99)
PHOSPHORUS: 2 mg/dL — AB (ref 2.5–4.6)
Potassium: 3.4 mmol/L — ABNORMAL LOW (ref 3.5–5.1)
SODIUM: 138 mmol/L (ref 135–145)

## 2015-01-23 LAB — MAGNESIUM: MAGNESIUM: 2.2 mg/dL (ref 1.7–2.4)

## 2015-01-23 LAB — CBC
HCT: 27.4 % — ABNORMAL LOW (ref 40.0–52.0)
HCT: 27.5 % — ABNORMAL LOW (ref 40.0–52.0)
HEMOGLOBIN: 8.5 g/dL — AB (ref 13.0–18.0)
HEMOGLOBIN: 8.2 g/dL — AB (ref 13.0–18.0)
MCH: 27.2 pg (ref 26.0–34.0)
MCH: 26.7 pg (ref 26.0–34.0)
MCHC: 30.9 g/dL — AB (ref 32.0–36.0)
MCHC: 29.8 g/dL — AB (ref 32.0–36.0)
MCV: 88.2 fL (ref 80.0–100.0)
MCV: 89.4 fL (ref 80.0–100.0)
Platelets: 186 10*3/uL (ref 150–440)
Platelets: 160 10*3/uL (ref 150–440)
RBC: 3.11 MIL/uL — ABNORMAL LOW (ref 4.40–5.90)
RBC: 3.07 MIL/uL — AB (ref 4.40–5.90)
RDW: 18.8 % — AB (ref 11.5–14.5)
RDW: 19.2 % — ABNORMAL HIGH (ref 11.5–14.5)
WBC: 21.9 10*3/uL — ABNORMAL HIGH (ref 3.8–10.6)
WBC: 20.1 10*3/uL — ABNORMAL HIGH (ref 3.8–10.6)

## 2015-01-23 LAB — CBC WITH DIFFERENTIAL/PLATELET
BAND NEUTROPHILS: 14 %
Basophils Absolute: 0 10*3/uL (ref 0–0.1)
Basophils Relative: 0 %
Eosinophils Absolute: 0 10*3/uL (ref 0–0.7)
Eosinophils Relative: 0 %
HCT: 27.9 % — ABNORMAL LOW (ref 40.0–52.0)
HEMOGLOBIN: 8.1 g/dL — AB (ref 13.0–18.0)
LYMPHS ABS: 2.5 10*3/uL (ref 1.0–3.6)
LYMPHS PCT: 10 %
MCH: 26.6 pg (ref 26.0–34.0)
MCHC: 29.1 g/dL — ABNORMAL LOW (ref 32.0–36.0)
MCV: 91.4 fL (ref 80.0–100.0)
METAMYELOCYTES PCT: 5 %
MONO ABS: 2 10*3/uL — AB (ref 0.2–1.0)
Monocytes Relative: 8 %
NEUTROS PCT: 63 %
Neutro Abs: 20.1 10*3/uL — ABNORMAL HIGH (ref 1.4–6.5)
Platelets: 134 10*3/uL — ABNORMAL LOW (ref 150–440)
RBC: 3.05 MIL/uL — ABNORMAL LOW (ref 4.40–5.90)
RDW: 19.1 % — ABNORMAL HIGH (ref 11.5–14.5)
WBC: 24.6 10*3/uL — ABNORMAL HIGH (ref 3.8–10.6)

## 2015-01-23 LAB — BASIC METABOLIC PANEL
Anion gap: 8 (ref 5–15)
BUN: 61 mg/dL — ABNORMAL HIGH (ref 6–20)
CHLORIDE: 102 mmol/L (ref 101–111)
CO2: 29 mmol/L (ref 22–32)
CREATININE: 5.94 mg/dL — AB (ref 0.61–1.24)
Calcium: 9.1 mg/dL (ref 8.9–10.3)
GFR calc non Af Amer: 9 mL/min — ABNORMAL LOW (ref >60)
GFR, EST AFRICAN AMERICAN: 11 mL/min — AB (ref >60)
Glucose, Bld: 281 mg/dL — ABNORMAL HIGH (ref 65–99)
POTASSIUM: 3.3 mmol/L — AB (ref 3.5–5.1)
Sodium: 139 mmol/L (ref 135–145)

## 2015-01-23 LAB — GLUCOSE, CAPILLARY
GLUCOSE-CAPILLARY: 134 mg/dL — AB (ref 65–99)
Glucose-Capillary: 182 mg/dL — ABNORMAL HIGH (ref 65–99)
Glucose-Capillary: 206 mg/dL — ABNORMAL HIGH (ref 65–99)
Glucose-Capillary: 195 mg/dL — ABNORMAL HIGH (ref 65–99)
Glucose-Capillary: 188 mg/dL — ABNORMAL HIGH (ref 65–99)

## 2015-01-23 LAB — HEPARIN INDUCED THROMBOCYTOPENIA PNL: HEPARIN INDUCED PLT AB: 0.288 {OD_unit} (ref 0.000–0.400)

## 2015-01-23 LAB — BLOOD GAS, ARTERIAL
Acid-Base Excess: 0.9 mmol/L (ref 0.0–3.0)
Allens test (pass/fail): POSITIVE — AB
BICARBONATE: 27.5 meq/L (ref 21.0–28.0)
Drawn by: 38121
FIO2: 0.44 %
O2 Saturation: 66.5 %
PATIENT TEMPERATURE: 37
PCO2 ART: 51 mmHg — AB (ref 32.0–48.0)
PO2 ART: 37 mmHg — AB (ref 83.0–108.0)
pH, Arterial: 7.34 — ABNORMAL LOW (ref 7.350–7.450)

## 2015-01-23 LAB — VANCOMYCIN, RANDOM: Vancomycin Rm: 160 ug/mL

## 2015-01-23 LAB — AMMONIA: Ammonia: 27 umol/L (ref 9–35)

## 2015-01-23 MED ORDER — MIDAZOLAM HCL 2 MG/2ML IJ SOLN
4.0000 mg | Freq: Once | INTRAMUSCULAR | Status: DC
Start: 2015-01-23 — End: 2015-01-24

## 2015-01-23 MED ORDER — HEPARIN SODIUM (PORCINE) 1000 UNIT/ML IJ SOLN
INTRAMUSCULAR | Status: AC
  Administered 2015-01-23: 1000 [IU]
  Filled 2015-01-23: qty 1

## 2015-01-23 MED ORDER — FENTANYL CITRATE (PF) 100 MCG/2ML IJ SOLN
INTRAMUSCULAR | Status: AC
  Administered 2015-01-23: 50 ug
  Filled 2015-01-23: qty 2

## 2015-01-23 MED ORDER — FREE WATER
200.0000 mL | Freq: Four times a day (QID) | Status: DC
  Administered 2015-01-23 (×2): 200 mL

## 2015-01-23 MED ORDER — FENTANYL CITRATE (PF) 100 MCG/2ML IJ SOLN: 50.0000 ug | INTRAMUSCULAR | Status: DC | PRN

## 2015-01-23 MED ORDER — ETOMIDATE 2 MG/ML IV SOLN
10.0000 mg | Freq: Once | INTRAVENOUS | Status: AC
  Administered 2015-01-23: 10 mg via INTRAVENOUS

## 2015-01-23 MED ORDER — VASOPRESSIN 20 UNIT/ML IV SOLN
0.0300 [IU]/min | INTRAVENOUS | Status: DC
  Administered 2015-01-23 – 2015-01-24 (×2): 0.03 [IU]/min via INTRAVENOUS
  Filled 2015-01-23 (×2): qty 2

## 2015-01-23 MED ORDER — PROPOFOL 1000 MG/100ML IV EMUL
5.0000 ug/kg/min | INTRAVENOUS | Status: DC
  Administered 2015-01-23: 25 ug/kg/min via INTRAVENOUS
  Administered 2015-01-24: 15 ug/kg/min via INTRAVENOUS
  Administered 2015-01-24: 10 ug/kg/min via INTRAVENOUS
  Filled 2015-01-23: qty 100

## 2015-01-23 MED ORDER — PROPOFOL 1000 MG/100ML IV EMUL
INTRAVENOUS | Status: AC
  Administered 2015-01-23: 25 ug/kg/min via INTRAVENOUS
  Filled 2015-01-23: qty 100

## 2015-01-23 MED ORDER — FENTANYL CITRATE (PF) 100 MCG/2ML IJ SOLN
INTRAMUSCULAR | Status: AC
  Administered 2015-01-23: 100 ug via INTRAVENOUS
  Filled 2015-01-23: qty 2

## 2015-01-23 MED ORDER — FENTANYL CITRATE (PF) 100 MCG/2ML IJ SOLN
100.0000 ug | Freq: Once | INTRAMUSCULAR | Status: AC
  Administered 2015-01-23: 100 ug via INTRAVENOUS

## 2015-01-23 MED ORDER — MIDAZOLAM HCL 2 MG/2ML IJ SOLN
INTRAMUSCULAR | Status: AC
  Administered 2015-01-23: 2 mg
  Filled 2015-01-23: qty 2

## 2015-01-23 MED ORDER — FENTANYL CITRATE (PF) 2500 MCG/50ML IJ SOLN
10.0000 ug/h | Status: DC
  Administered 2015-01-23: 10 ug/h via INTRAVENOUS
  Filled 2015-01-23: qty 100

## 2015-01-23 NOTE — Progress Notes (Signed)
Subjective:   Still lethargic. Wakes up to command Answers only few short questions Dialysis planned for today Tube feeds going   Objective:  Vital signs in last 24 hours:  Temp:  [98.3 F (36.8 C)-99 F (37.2 C)] 98.3 F (36.8 C) (05/12 0800) Pulse Rate:  [93-113] 101 (05/12 0800) Resp:  [14-24] 22 (05/12 0800) BP: (75-119)/(51-74) 97/63 mmHg (05/12 0800) SpO2:  [92 %-100 %] 97 % (05/12 0800) Weight:  [96.5 kg (212 lb 11.9 oz)] 96.5 kg (212 lb 11.9 oz) (05/12 0601)  Weight change: 7.1 kg (15 lb 10.4 oz) Filed Weights   01/21/15 1804 01/22/15 0238 01/23/15 0601  Weight: 89.8 kg (197 lb 15.6 oz) 91.3 kg (201 lb 4.5 oz) 96.5 kg (212 lb 11.9 oz)    Intake/Output: I/O last 3 completed shifts: In: 3066.1 [I.V.:100.5; NG/GT:1866; IV Piggyback:1099.6] Out: -    Intake/Output this shift:  Total I/O In: 50 [NG/GT:50] Out: -   Physical Exam: General: Critically ill, lethargic  Head: dry oral mucosal membranes  Eyes: Muddy sclera  Neck: Supple, trachea midline  Lungs:  Diminished on right   Heart: Regular rate and rhythm  Abdomen:  Mild guarding  Extremities:  no peripheral edema.  Neurologic: Not oriented, lethargic.   Skin: No lesions, decreased turgor  Access: Right femoral trialysis catheter 5/6    Basic Metabolic Panel:  Recent Labs Lab 01/17/15 0037 01/17/15 2250 01/18/15 1720 01/19/15 0500 01/19/15 0915 01/21/15 1559 01/22/15 0054  NA 135 135 133* 138  --  138 142  K 4.5 4.9 4.5 4.0  --  4.0 3.4*  CL 98* 96* 96* 97*  --  102 103  CO2 24 25 27 28   --  29 29  GLUCOSE 122* 128* 147* 136*  --  193* 194*  BUN 60* 67* 44* 33*  --  54* 42*  CREATININE 11.33* 12.03* 8.11* 6.06*  --  6.61* 5.28*  CALCIUM 8.5* 8.5* 8.3* 8.6*  --  8.6* 9.0  MG  --   --   --   --  2.3  --   --   PHOS 7.3* 7.8* 6.1* 4.9*  --   --  2.8    Liver Function Tests:  Recent Labs Lab 01/17/15 0037 01/17/15 2250 01/18/15 1720 01/19/15 0500 01/22/15 0054  AST  --  29  --   --    --   ALT  --  14*  --   --   --   ALKPHOS  --  82  --   --   --   BILITOT  --  1.2  --   --   --   PROT  --  6.4*  --   --   --   ALBUMIN 2.3* 2.2*  2.2* 1.9* 1.9* 1.8*   No results for input(s): LIPASE, AMYLASE in the last 168 hours. No results for input(s): AMMONIA in the last 168 hours.  CBC:  Recent Labs Lab 01/19/15 0915 01/21/15 1559 01/22/15 0054 01/22/15 0649 01/23/15 0638  WBC 38.4* 25.2* 23.1* 19.8* 21.9*  HGB 9.5* 8.2* 8.4* 8.0* 8.5*  HCT 31.0* 28.1* 27.9* 26.3* 27.4*  MCV 87.7 88.7 88.2 87.7 88.2  PLT 307 261 189 178 186    Cardiac Enzymes: No results for input(s): CKTOTAL, CKMB, CKMBINDEX, TROPONINI in the last 168 hours.  BNP: Invalid input(s): POCBNP  CBG:  Recent Labs Lab 01/22/15 1617 01/22/15 2015 01/22/15 2350 01/23/15 0411 01/23/15 0728  GLUCAP 218* 164* 119* 182* 206*  Microbiology: Results for orders placed or performed during the hospital encounter of 01/23/2015  Culture, blood (routine x 2)     Status: None   Collection Time: 01/12/2015 12:57 PM  Result Value Ref Range Status   Specimen Description BLOOD  Final   Special Requests BLOOD  Final   Culture NO GROWTH 5 DAYS  Final   Report Status 01/19/2015 FINAL  Final  Culture, blood (routine x 2)     Status: None   Collection Time: 02/09/2015 12:57 PM  Result Value Ref Range Status   Specimen Description BLOOD  Final   Special Requests BLOOD  Final   Culture NO GROWTH 5 DAYS  Final   Report Status 01/19/2015 FINAL  Final  Cath Tip Culture     Status: None   Collection Time: 01/29/2015  5:57 PM  Result Value Ref Range Status   Specimen Description CATH TIP  Final   Special Requests NONE  Final   Culture NO GROWTH 3 DAYS  Final   Report Status 01/18/2015 FINAL  Final  C difficile quick scan w PCR reflex [facility])     Status: None   Collection Time: 01/15/15 11:02 AM  Result Value Ref Range Status   C Diff antigen POSITIVE  Final   C Diff toxin NEGATIVE  Final   C Diff interpretation    Final    Positive for toxigenic C. difficile, active toxin production not detected. Patient has toxigenic C. difficile organisms present in the bowel, but toxin was not detected. The patient may be a carrier or the level of toxin in the sample was below the limit  of detection. This information should be used in conjunction with the patient's clinical history when deciding on possible therapy.     Comment: CRITICAL RESULT CALLED TO, READ BACK BY AND VERIFIED WITH: Zettie CooleyMARY WITHERSPOON @ 1400 01/15/15 BGB   Clostridium Difficile by PCR     Status: Abnormal   Collection Time: 01/15/15 11:02 AM  Result Value Ref Range Status   C difficile by pcr POSITIVE (A) NEGATIVE Final  Culture, blood (routine x 2)     Status: None   Collection Time: 01/17/15 11:03 PM  Result Value Ref Range Status   Specimen Description BLOOD  Final   Special Requests BLOOD  Final   Culture NO GROWTH 5 DAYS  Final   Report Status 01/22/2015 FINAL  Final  Culture, blood (routine x 2)     Status: None (Preliminary result)   Collection Time: 01/17/15 11:03 PM  Result Value Ref Range Status   Specimen Description BLOOD  Final   Special Requests BLOOD  Final   Report Status PENDING  Incomplete    Coagulation Studies: No results for input(s): LABPROT, INR in the last 72 hours.  Urinalysis: No results for input(s): COLORURINE, LABSPEC, PHURINE, GLUCOSEU, HGBUR, BILIRUBINUR, KETONESUR, PROTEINUR, UROBILINOGEN, NITRITE, LEUKOCYTESUR in the last 72 hours.  Invalid input(s): APPERANCEUR    Imaging: No results found.   Medications:   . norepinephrine (LEVOPHED) Adult infusion 5 mcg/min (01/23/15 0700)   . DULoxetine  60 mg Oral Daily  . epoetin (EPOGEN/PROCRIT) injection  10,000 Units Subcutaneous Weekly  . famotidine  10 mg Oral Daily  . feeding supplement (NEPRO CARB STEADY)  1,000 mL Per Tube Q24H  . fluticasone  1 spray Each Nare Daily  . free water  200 mL Per Tube Q6H  . insulin aspart  0-9 Units Subcutaneous  6 times per day  . levofloxacin (LEVAQUIN) IV  500 mg Intravenous Q48H  . levothyroxine  125 mcg Oral QAC breakfast  . metroNIDAZOLE  500 mg Oral 3 times per day  . montelukast  10 mg Oral QHS  . multivitamin  1 tablet Oral Daily  . pantoprazole sodium  40 mg Per Tube Daily  . tiotropium  18 mcg Inhalation Daily  . vancomycin  1,000 mg Intravenous Q T,Th,Sa-HD   acetaminophen **OR** acetaminophen, hydrALAZINE, ondansetron **OR** ondansetron (ZOFRAN) IV, oxyCODONE  Assessment/ Plan:  59 y.o. black male  With asthma, diabetes mellitus type II, diabetic retinopathy, hypertension, gout, hyperlipidemia, and morbid obesity and ESRD   [facility] TTS first shift.   1. End stage renal disease: with infected catheter. Now removed.  Will need tunnelled catheter eventually. Cultures from [facility] show 5/3 Kebsiella: placed on levofoxacin Tolerated HD ok on 5/14. treatment stopped early due to poor BFR Packed with tPA Dialysis today  2. Metabolic Encephalopathy:G93.41   - likely sepsis, and medication effect - neurontin  3. Right upper lobe  acute pneumonia with sepsis: J18.9. I45.9 - multiple antibiotics  -  Add free water  4. Anemia of chronic kidney disease:   - ARANESP q weekly (TUE)   5. C Diff positive - abx per Primary team  6. Secondary Hyperparathyroidism: PTH elevated as outpatient at 1120,  - continue calcium acetate liquid for binding   - Tube feeds continued       LOS: 9 Meredith Mells 5/12/20169:44 AM

## 2015-01-23 NOTE — Progress Notes (Signed)
Inpatient Diabetes Program Recommendations  AACE/ADA: New Consensus Statement on Inpatient Glycemic Control (2013)  Target Ranges:  Prepandial:   less than 140 mg/dL      Peak postprandial:   less than 180 mg/dL (1-2 hours)      Critically ill patients:  140 - 180 mg/dL  Results for [name] ReddenBNEY, Nial (MRN [id number]) as of 01/23/2015 15:22  Ref. Range 01/22/2015 20:15 01/22/2015 23:50 01/23/2015 04:11 01/23/2015 07:28 01/23/2015 11:36  Glucose-Capillary Latest Ref Range: 65-99 mg/dL 409164 (H) 811119 (H) 914182 (H) 206 (H) 195 (H)     Diabetes history: DM2 Outpatient Diabetes medications: Levemir 18 untis QHS, Novolog 2-10 units TID with meals, Glipizide 10 mg BID Current orders for Inpatient glycemic control: None  Basal:  Consider starting Lantus 10 units q day (0.1units/kg) Correction; Please consider using Glycemic Control Order Set and increase Novolog to moderate correction Q4H.  Susette RacerJulie Relda Agosto, RN, BA, MHA, CDE Diabetes Coordinator Inpatient Diabetes Program  [phone] (Team Pager) [phone] [facility] Office) 01/23/2015 3:20 PM

## 2015-01-23 NOTE — Progress Notes (Signed)
Patient was receiving dialysis with patient becoming more hypotensive, levophed gtt titrated up to 9110mcg/min, patient pulse ox with difficulty picking up,  sometimes read in 90s% but unable to maintain constant reading with o2 saturation dipping down into the 70s but coming back up.  Lungs sounds coarse crackles and rhonchous, patient appeared more restless, Dr Thedore MinsSingh notfified with MD stopping dialysis as well as stopping free water order.   Dr Cherlynn KaiserSainani also notified to ask for ABG, order for stat ABG obtained along with portable chest x-ray.   Dr Auburn BilberryShreyang Patel came to bedside upon which MD ordered emergent entubation after viewing result.  Code called post entubation with patient staballized.  Dr Belia HemanKasa notified for sedation order.  Dr Thedore MinsSingh also at bedside, states okay to use dialysis port emergently for medication.  Order for central line placement obtained with [facility] to place central/PICC line.  Patient currently on ventilator on 100% fio2, on levophed and vasopressin gtt with fentanyl gtt for sedation.   Report given to night RN.

## 2015-01-23 NOTE — Progress Notes (Signed)
PT Cancellation Note  Patient Details Name: [name] MRN: [id number] DOB: 07/13/1956   Cancelled Treatment:    Reason Eval/Treat Not Completed: Fatigue/lethargy limiting ability to participate;Patient's level of consciousness (patient remains generally lethargic. Opens eyes briefly (1-2 seconds) at a time with max cuing from therapist, but unable to maintain alertness for active participation/effort with session this date.  Will continue efforts at later time/date as apropriat)   [name], PT, DPT 01/23/2015, 11:54 AM [phone]

## 2015-01-23 NOTE — Progress Notes (Addendum)
[facility] [place] [facility]   PATIENT NAME: [name]    MR#:  [id number]  DATE OF BIRTH:  07/25/1956  SUBJECTIVE:   Called by nurse due to patient 02 sats unable to pick up, abg checked right away, his o2, 37 REVIEW OF SYSTEMS:   Review of Systems  Unable to obtain      Nutrition: Tube Feeds Tolerating PT: No Tolerating diet: No due to encephalopathy.   DRUG ALLERGIES:   Allergies  Allergen Reactions  . Penicillins Hives  . Penicillin G Other (See Comments)    VITALS:  Blood pressure 85/50, pulse 117, temperature 98.2 F (36.8 C), temperature source Oral, resp. rate 28, height 6\' 1"  (1.854 m), weight 96.5 kg (212 lb 11.9 oz), SpO2 90 %.  PHYSICAL EXAMINATION:  GENERAL:  59 y.o.-year-old patient crtically ill  EYES: Pupils equal, round, reactive to light and accommodation. No scleral icterus. Extraocular muscles intact.  HEENT: Head atraumatic, normocephalic. Dry Oral Mucosa. NG tube in place.  NECK:  Supple, no jugular venous distention. No thyroid enlargement, no tenderness.  LUNGS: b/l rochi , with accesory muscle use  CARDIOVASCULAR: S1, S2 normal. No murmurs, rubs, or gallops.  ABDOMEN: Soft, nontender, nondistended. Bowel sounds present. No organomegaly or mass.  EXTREMITIES + edema NEUROLOGIC: remains lethargic,   PSYCHIATRIC: Encephalopathic   SKIN: No obvious rash, lesion, or ulcer.    LABORATORY PANEL:   CBC  Recent Labs Lab 01/23/15 0638 01/23/15 1425  WBC 21.9* 20.1*  HGB 8.5* 8.2*  HCT 27.4* 27.5*  PLT 186 160   ------------------------------------------------------------------------------------------------------------------  Chemistries   Recent Labs Lab 01/17/15 2250  01/19/15 0915  01/22/15 0054 01/23/15 1425  NA 135  < >  --   < > 142 138  139  K 4.9  < >  --   < > 3.4* 3.4*  3.3*  CL 96*  < >  --   < > 103 99*  102  CO2 25  < >  --   < > 29 28  29   GLUCOSE 128*  < >  --   < > 194* 310*   281*  BUN 67*  < >  --   < > 42* 58*  61*  CREATININE 12.03*  < >  --   < > 5.28* 6.04*  5.94*  CALCIUM 8.5*  < >  --   < > 9.0 9.0  9.1  MG  --   --  2.3  --   --  2.2  AST 29  --   --   --   --   --   ALT 14*  --   --   --   --   --   ALKPHOS 82  --   --   --   --   --   BILITOT 1.2  --   --   --   --   --   < > = values in this interval not displayed. ------------------------------------------------------------------------------------------------------------------  Cardiac Enzymes No results for input(s): TROPONINI in the last 168 hours. ------------------------------------------------------------------------------------------------------------------  RADIOLOGY:  No results found.   ASSESSMENT AND PLAN:   1. Acute hypoxic repiratory failure- suspect due to aspiration, pna. Pt was urgently intubated, pt got versed, atomodate and fentynl. Pt post intubation, hr started dropping, code blue was called, pt started on chest compression started, he was given iv epinephrine, bicarb, pt pulse obtained , bp still low despite being on max dose  levophed,pt received fluid bolus, he will be started on vasopressin, He will have stat labs. Dr. Belia HemanKasa has been consulted. He has given verbal orders to nurse. Pt allergic to pcn, already on vancomycin , levaquin and flagyl.              CODE STATUS: Full code.    All the records are reviewed and case discussed with Care Management/Social Workerr. Management plans discussed with the patient, family and they are in agreement.  CODE STATUS: full code  TOTAL Critical care TIME TAKING CARE OF THIS PATIENT : 90 min critical care time also code was run by me.    Auburn BilberryPATEL, Ilze Roselli M.D on 01/23/2015 at 6:19 PM  Between 7am to 6pm - Pager - [phone]  After 6pm go to [email or web address] - password [email or web address] [facility]  [facility]  Office  [phone]  CC: Primary care physician; No primary care provider on file.

## 2015-01-23 NOTE — Care Management Note (Signed)
I will contact patients out patient dialysis center [facility] [address] to let them know that the plan is for patient to go to LTAC at discharge.

## 2015-01-23 NOTE — Progress Notes (Signed)
   01/23/15 1800  Clinical Encounter Type  Visited With Patient  Visit Type Code  Referral From Nurse  Consult/Referral To Chaplain  Responded to CCU for Code Blue.  CPR performed.  Pt's was not entirely stable, but had to leave so an xray could be administered.  Provided presence and support to nursing staff.  Asbury Automotive GroupChaplain Alondria Mousseau-pager [phone]

## 2015-01-23 NOTE — Progress Notes (Signed)
[facility] -  at [facility]   PATIENT NAME: [name] ReddenJerry [name]    MR#:  [id number]  DATE OF BIRTH:  09/30/1955  SUBJECTIVE:   Remains lethargic, difficult to understand but follows simple commands.  Diarrhea improving.  Remains on low dose vasopressors. Awaiting LTAC placement.   REVIEW OF SYSTEMS:   Review of Systems  Constitutional: Negative for fever and chills.  HENT: Negative for congestion and tinnitus.   Eyes: Negative for blurred vision and double vision.  Respiratory: Negative for cough, shortness of breath and wheezing.   Cardiovascular: Negative for chest pain, orthopnea and PND.  Gastrointestinal: Negative for nausea, vomiting, abdominal pain and diarrhea.  Genitourinary: Negative for dysuria and hematuria.  Neurological: Positive for weakness (generalized). Negative for dizziness, sensory change and focal weakness.  All other systems reviewed and are negative.  Nutrition: Tube Feeds Tolerating PT: No Tolerating diet: No due to encephalopathy.   DRUG ALLERGIES:   Allergies  Allergen Reactions  . Penicillins Hives  . Penicillin G Other (See Comments)    VITALS:  Blood pressure 112/72, pulse 105, temperature 98.3 F (36.8 C), temperature source Oral, resp. rate 21, height 6\' 1"  (1.854 m), weight 96.5 kg (212 lb 11.9 oz), SpO2 97 %.  PHYSICAL EXAMINATION:  GENERAL:  59 y.o.-year-old patient lethargic lying in the bed with no acute distress.  EYES: Pupils equal, round, reactive to light and accommodation. No scleral icterus. Extraocular muscles intact.  HEENT: Head atraumatic, normocephalic. Dry Oral Mucosa. NG tube in place.  NECK:  Supple, no jugular venous distention. No thyroid enlargement, no tenderness.  LUNGS: Normal breath sounds bilaterally, no wheezing, rales. + upper airway rhonchi. No use of accessory muscles of respiration.  CARDIOVASCULAR: S1, S2 normal. No murmurs, rubs, or gallops.  ABDOMEN: Soft, nontender,  nondistended. Bowel sounds present. No organomegaly or mass.  EXTREMITIES: No pedal edema, cyanosis, or clubbing.  NEUROLOGIC: remains lethargic, encephalopathic, follows simple commands. Slurred speech. PSYCHIATRIC: Encephalopathic but alert.  SKIN: No obvious rash, lesion, or ulcer.    LABORATORY PANEL:   CBC  Recent Labs Lab 01/22/15 0649 01/23/15 0638  WBC 19.8* 21.9*  HGB 8.0* 8.5*  HCT 26.3* 27.4*  PLT 178 186   ------------------------------------------------------------------------------------------------------------------  Chemistries   Recent Labs Lab 01/17/15 2250  01/19/15 0915 01/21/15 1559 01/22/15 0054  NA 135  < >  --  138 142  K 4.9  < >  --  4.0 3.4*  CL 96*  < >  --  102 103  CO2 25  < >  --  29 29  GLUCOSE 128*  < >  --  193* 194*  BUN 67*  < >  --  54* 42*  CREATININE 12.03*  < >  --  6.61* 5.28*  CALCIUM 8.5*  < >  --  8.6* 9.0  MG  --   --  2.3  --   --   AST 29  --   --   --   --   ALT 14*  --   --   --   --   ALKPHOS 82  --   --   --   --   BILITOT 1.2  --   --   --   --   < > = values in this interval not displayed. ------------------------------------------------------------------------------------------------------------------  Cardiac Enzymes No results for input(s): TROPONINI in the last 168 hours. ------------------------------------------------------------------------------------------------------------------  RADIOLOGY:  No results found.   ASSESSMENT AND PLAN:   *  Dialysis access site infection - BC as outpatient dialysis were + & growing Klebsiella.   - s/p Perm Cath removal by Dr schnier on 02/02/2015 - BC from 02/06/2015 & Cath tip cx 01/21/2015 remain (-) - s/p temp. Cath placement. Cont. Levaquin   * End-stage renal disease on hemodialysis. - Nephro following and cont. HD as per them. - will have HD today.    * Sepsis - likely due to pneumonia as seen on CT chest also dialysis access infection.   - afebrile, but remains  on levophed.  Keep MAP > 55.  - cont. Empiric abx w/ Vanco, Levaquin, Flagyl.  - blood cultures (-) so far.   * AMS/Encephalopathy - thought to be related to uremia/sepsis but slow to improve.   - CT head (-).  Does have a leukocytosis and CT chest suggestive of pneumonia.  Likely Metabolic encephalopathy from sepsis and slowly improving - cont. IV Vanc, Levaquin, Flagyl - appreciate Neuro eval and no further intervention and cont. Current care.  No need for EEG.   * Leukocytosis - ?? Related to pneumonia/sepsis.  - trending down w/ IV abx therapy and will monitor.    * Diabetes type II w/out complication.  - BS stable now cont. SSI.   * Clostridium difficile colitis - diarrhea improving.   - cont. po flagyl.   * Essential Hypertension. - bp on the lower side - hold bp meds  * DM Neuropathy - cont. Neurontin   * COPD - no acute exacerbation.  - cont. Spiriva.   * Depression - cont. Cymbalta  * Hypothyroidism - cont. Synthroid.   * Nutrition - s/p NG tube and tolerating tube feeds for now. - await Speech eval.    Pt. Has multiple chronic issues and would be a good candidate for LTAC.  Discussed w/ CM and they are evaluating pt. For LTAC.   CODE STATUS: Full code.    All the records are reviewed and case discussed with Care Management/Social Workerr. Management plans discussed with the patient, family and they are in agreement.  CODE STATUS: full code  TOTAL Critical care TIME TAKING CARE OF THIS PATIENT: 30 mins   [name] J M.D on 01/23/2015 at 2:00 PM  Between 7am to 6pm - Pager - [phone]  After 6pm go to [email or web address] - password [email or web address] [facility]  [facility]  Office  [phone]  CC: Primary care physician; No primary care provider on file.

## 2015-01-23 NOTE — Evaluation (Signed)
Clinical/Bedside Swallow Evaluation Patient Details  Name: [name] MRN: [id number] Date of Birth: 08/21/1956  Today's Date: 01/23/2015 Time: SLP Start Time (ACUTE ONLY): 1145 SLP Stop Time (ACUTE ONLY): 1245 SLP Time Calculation (min) (ACUTE ONLY): 60 min  Past Medical History:  Past Medical History  Diagnosis Date  . Diabetes   . End stage renal disease on dialysis   . Anemia    Past Surgical History: History reviewed. No pertinent past surgical history. HPI:  pt has an NG tube in place for nutrition/hydration. He has been poorly responsive d/t encephalopathy per MD.   Assessment / Plan / Recommendation Clinical Impression   Pt presented w/ high risk for aspiration; noted overt s/s of aspiration were present during trials of ice chips including delayed coughing and wet vocal quality. Pt is poorly alert and demo. decreased oral bolus awareness for safe oral intake. Rec. continue NPO status w/ oral care and oral stim. to increase oral awareness for initiation of po's when appropriate as medical status improves. Rec. assessment/tx for Thrush as indicated. This was discussed w/ MD and NSG.     Aspiration Risk   High; guarded status at this time    Diet Recommendation  NPO w/ continued TFs via NG        Other  Recommendations  f/u w/ potential GI consult for further alternative means of feeding as indicated   Follow Up Recommendations   ST will f/u next 1-3 days x1 week for oral stim to increase oral awareness; awareness/practice of pharyngeal swallow.    Frequency and Duration        Pertinent Vitals/Pain denied    SLP Swallow Goals     Swallow Study Prior Functional Status       General Date of Onset: 01/23/15 Other Pertinent Information: pt has an NG tube in place for nutrition/hydration. He has been poorly responsive d/t encephalopathy per MD. Type of Study: Bedside swallow evaluation Previous Swallow Assessment: none indicated Diet Prior to this Study:  Regular Temperature Spikes Noted: No Respiratory Status: Supplemental O2 delivered via (comment) Behavior/Cognition: Lethargic/Drowsy;Requires cueing;Doesn't follow directions;Other (Comment) (declined cognitive status follow through) Oral Cavity - Dentition: Missing dentition Self-Feeding Abilities: Total assist Patient Positioning: Upright in bed Baseline Vocal Quality: Low vocal intensity Volitional Cough: Cognitively unable to elicit Volitional Swallow: Unable to elicit    Oral/Motor/Sensory Function Overall Oral Motor/Sensory Function: Other (comment) (could not follow through w/ instruction sec. to decline in Cognitive status)   Ice Chips Ice chips: Impaired Presentation: Spoon Oral Phase Impairments: Reduced labial seal;Reduced lingual movement/coordination;Impaired anterior to posterior transit;Poor awareness of bolus;Impaired mastication Oral Phase Functional Implications: Prolonged oral transit Pharyngeal Phase Impairments: Throat Clearing - Delayed;Cough - Delayed;Change in Vital Signs Other Comments: did not appreciate a phayrngeal swallow post 3 ice chip trials in a row   Thin Liquid Thin Liquid: Not tested    Nectar Thick Nectar Thick Liquid: Not tested   Honey Thick Honey Thick Liquid: Not tested   Puree Puree: Not tested   Solid   GO    Solid: Not tested       [name],[name] 01/23/2015,4:17 PM

## 2015-01-23 NOTE — Progress Notes (Signed)
HD TX TERMINATED  With 1 hour left of tx. Patient O2  Sat 70-80's. Heart rate 120. Bp remained low despite levo titration. Started at and at . 0ml fluid removal. Patient received normal saline with tx.   -new set up used due to clotting of ven chamber

## 2015-01-23 NOTE — Progress Notes (Signed)
Nutrition Follow-up  DOCUMENTATION CODES:    INTERVENTION:  EN: Recommend continuing at goal rate of 101m/hr to meet 100% nutritional needs. Noted MD ordered 2011mof free water q6 hours; (total free water 180167maily, with free water in TF) Coordination of Care: per MD Kasa in rounds will have SLP evaluate. KatBelenda CruiseLP recommends NPO, remain on TF at this time; not safe for po.  NUTRITION DIAGNOSIS:  Inadequate oral intake related to lethargy/confusion as evidenced by estimated needs, meal completion < 25% but being addressed with TF  GOAL:  Goal for pt to tolerate initiation of TF at goal rate within 24 hr, goal met. New goal tolerate at goal.  MONITOR: EN Electrolyte and renal profile Glucose profile Digestive system UOP Anthropometrics  REASON FOR ASSESSMENT: (follow-up) Enteral/tube feeding initiation and management  ASSESSMENT:  Pt becoming more alert at times per RN Tram in rounds.  Pt to receive HD again today. Pt positive for c.diff, rectal tube in place. Per Care management note, LTAC at discharge is current plan.  Medications: MV, flagyl, protonix, levophed  Labs: Electrolyte and Renal Profile:    Recent Labs Lab 01/18/15 1720 01/19/15 0500 01/19/15 0915 01/21/15 1559 01/22/15 0054  BUN 44* 33*  --  54* 42*  CREATININE 8.11* 6.06*  --  6.61* 5.28*  NA 133* 138  --  138 142  K 4.5 4.0  --  4.0 3.4*  MG  --   --  2.3  --   --   PHOS 6.1* 4.9*  --   --  2.8   Glucose Profile:  Recent Labs  01/23/15 0411 01/23/15 0728 01/23/15 1136  GLUCAP 182* 206* 195*   Protein Profile:  Recent Labs Lab 01/18/15 1720 01/19/15 0500 01/22/15 0054  ALBUMIN 1.9* 1.9* 1.8*    Height:  Ht Readings from Last 1 Encounters:  01/25/2015 6' 1"  (1.854 m)    Weight:  Wt Readings from Last 1 Encounters:  01/23/15 212 lb 11.9 oz (96.5 kg)    Wt Readings from Last 10 Encounters:  01/23/15 212 lb 11.9 oz (96.5 kg)    BMI:  Body mass index is 28.07  kg/(m^2).  Estimated Nutritional Needs:  Kcal:  2240-2647kcals, BEE: 1697kcals  Protein:  102-128g protein (1.0-1.2g/kg)  Fluid:  UOP+1000m75miet Order:  Diet NPO time specified  EDUCATION NEEDS:  Education needs no appropriate at this time   Intake/Output Summary (Last 24 hours) at 01/23/15 1352 Last data filed at 01/23/15 1200  Gross per 24 hour  Intake 1140.41 ml  Output      0 ml  Net 1140.41 ml    Last BM:  5/12 loose rectal tube (c.diff positive)  HIGH Care Level  [name], LDN Pager [phone]

## 2015-01-23 NOTE — Progress Notes (Signed)
I have updated his twin brother [name] regarding his brothers current clinical course and explained he is very sick and crically ill and may not survive. He is tearfull but understands the graveness of the situation.

## 2015-01-23 NOTE — Progress Notes (Signed)
Notified [name] Respiratory Therapist regarding chest x-ray result for post entubation with note saying needs for advancement. Brett CanalesSteve to check on patient.

## 2015-01-24 ENCOUNTER — Inpatient Hospital Stay: Payer: Medicare Other

## 2015-01-24 ENCOUNTER — Inpatient Hospital Stay: Admission: AD | Admit: 2015-01-24 | Payer: Medicare Other | Source: Ambulatory Visit | Admitting: Internal Medicine

## 2015-01-24 LAB — COMPREHENSIVE METABOLIC PANEL
ALBUMIN: 1.5 g/dL — AB (ref 3.5–5.0)
ALK PHOS: 120 U/L (ref 38–126)
ALT: 17 U/L (ref 17–63)
ALT: 31 U/L (ref 17–63)
ANION GAP: 18 — AB (ref 5–15)
AST: 90 U/L — ABNORMAL HIGH (ref 15–41)
AST: 196 U/L — ABNORMAL HIGH (ref 15–41)
Albumin: 1.7 g/dL — ABNORMAL LOW (ref 3.5–5.0)
Alkaline Phosphatase: 132 U/L — ABNORMAL HIGH (ref 38–126)
Anion gap: 14 (ref 5–15)
BUN: 43 mg/dL — AB (ref 6–20)
BUN: 56 mg/dL — AB (ref 6–20)
CALCIUM: 8.7 mg/dL — AB (ref 8.9–10.3)
CO2: 26 mmol/L (ref 22–32)
CO2: 22 mmol/L (ref 22–32)
CREATININE: 4.63 mg/dL — AB (ref 0.61–1.24)
CREATININE: 5.28 mg/dL — AB (ref 0.61–1.24)
Calcium: 10 mg/dL (ref 8.9–10.3)
Chloride: 102 mmol/L (ref 101–111)
Chloride: 101 mmol/L (ref 101–111)
GFR calc Af Amer: 15 mL/min — ABNORMAL LOW (ref >60)
GFR calc Af Amer: 13 mL/min — ABNORMAL LOW (ref >60)
GFR calc non Af Amer: 11 mL/min — ABNORMAL LOW (ref >60)
GFR, EST NON AFRICAN AMERICAN: 13 mL/min — AB (ref >60)
Glucose, Bld: 197 mg/dL — ABNORMAL HIGH (ref 65–99)
Glucose, Bld: 186 mg/dL — ABNORMAL HIGH (ref 65–99)
Potassium: 3.5 mmol/L (ref 3.5–5.1)
Potassium: 4.4 mmol/L (ref 3.5–5.1)
SODIUM: 141 mmol/L (ref 135–145)
Sodium: 142 mmol/L (ref 135–145)
TOTAL PROTEIN: 5.1 g/dL — AB (ref 6.5–8.1)
Total Bilirubin: 0.9 mg/dL (ref 0.3–1.2)
Total Bilirubin: 1 mg/dL (ref 0.3–1.2)
Total Protein: 5.6 g/dL — ABNORMAL LOW (ref 6.5–8.1)

## 2015-01-24 LAB — CBC
HCT: 28.6 % — ABNORMAL LOW (ref 40.0–52.0)
HCT: 24.6 % — ABNORMAL LOW (ref 40.0–52.0)
HEMOGLOBIN: 8.5 g/dL — AB (ref 13.0–18.0)
Hemoglobin: 7.2 g/dL — ABNORMAL LOW (ref 13.0–18.0)
MCH: 26.5 pg (ref 26.0–34.0)
MCH: 26.8 pg (ref 26.0–34.0)
MCHC: 29.9 g/dL — ABNORMAL LOW (ref 32.0–36.0)
MCHC: 29.2 g/dL — ABNORMAL LOW (ref 32.0–36.0)
MCV: 88.7 fL (ref 80.0–100.0)
MCV: 92 fL (ref 80.0–100.0)
PLATELETS: 116 10*3/uL — AB (ref 150–440)
Platelets: 108 10*3/uL — ABNORMAL LOW (ref 150–440)
RBC: 3.22 MIL/uL — ABNORMAL LOW (ref 4.40–5.90)
RBC: 2.67 MIL/uL — ABNORMAL LOW (ref 4.40–5.90)
RDW: 18.8 % — AB (ref 11.5–14.5)
RDW: 19.5 % — AB (ref 11.5–14.5)
WBC: 28.3 10*3/uL — ABNORMAL HIGH (ref 3.8–10.6)
WBC: 34.4 10*3/uL — ABNORMAL HIGH (ref 3.8–10.6)

## 2015-01-24 LAB — GLUCOSE, CAPILLARY
GLUCOSE-CAPILLARY: 166 mg/dL — AB (ref 65–99)
Glucose-Capillary: 139 mg/dL — ABNORMAL HIGH (ref 65–99)
Glucose-Capillary: 144 mg/dL — ABNORMAL HIGH (ref 65–99)
Glucose-Capillary: 162 mg/dL — ABNORMAL HIGH (ref 65–99)
Glucose-Capillary: 169 mg/dL — ABNORMAL HIGH (ref 65–99)

## 2015-01-24 LAB — BLOOD GAS, ARTERIAL
ACID-BASE EXCESS: 4.3 mmol/L — AB (ref 0.0–3.0)
ALLENS TEST (PASS/FAIL): POSITIVE — AB
ALLENS TEST (PASS/FAIL): POSITIVE — AB
Acid-base deficit: 8.4 mmol/L — ABNORMAL HIGH (ref 0.0–2.0)
BICARBONATE: 27.1 meq/L (ref 21.0–28.0)
Bicarbonate: 19.9 mEq/L — ABNORMAL LOW (ref 21.0–28.0)
FIO2: 1 %
FIO2: 100 %
MECHVT: 500 mL
O2 SAT: 99 %
O2 Saturation: 79.1 %
PCO2 ART: 34 mmHg (ref 32.0–48.0)
PCO2 ART: 51 mmHg — AB (ref 32.0–48.0)
PEEP/CPAP: 8 cmH2O
PEEP: 8 cmH2O
PO2 ART: 118 mmHg — AB (ref 83.0–108.0)
PO2 ART: 54 mmHg — AB (ref 83.0–108.0)
Patient temperature: 37
Patient temperature: 37
RATE: 24 resp/min
RATE: 24 resp/min
VT: 550 mL
pH, Arterial: 7.51 — ABNORMAL HIGH (ref 7.350–7.450)
pH, Arterial: 7.2 — ABNORMAL LOW (ref 7.350–7.450)

## 2015-01-24 LAB — BASIC METABOLIC PANEL
ANION GAP: 11 (ref 5–15)
BUN: 51 mg/dL — AB (ref 6–20)
CALCIUM: 9 mg/dL (ref 8.9–10.3)
CO2: 28 mmol/L (ref 22–32)
Chloride: 103 mmol/L (ref 101–111)
Creatinine, Ser: 5.01 mg/dL — ABNORMAL HIGH (ref 0.61–1.24)
GFR, EST AFRICAN AMERICAN: 13 mL/min — AB (ref >60)
GFR, EST NON AFRICAN AMERICAN: 12 mL/min — AB (ref >60)
Glucose, Bld: 166 mg/dL — ABNORMAL HIGH (ref 65–99)
Potassium: 3.9 mmol/L (ref 3.5–5.1)
Sodium: 142 mmol/L (ref 135–145)

## 2015-01-24 LAB — TROPONIN I
Troponin I: 0.66 ng/mL — ABNORMAL HIGH (ref <0.031)
Troponin I: 0.83 ng/mL — ABNORMAL HIGH (ref <0.031)
Troponin I: 1 ng/mL — ABNORMAL HIGH (ref <0.031)

## 2015-01-24 LAB — MAGNESIUM
MAGNESIUM: 2.5 mg/dL — AB (ref 1.7–2.4)
Magnesium: 2.2 mg/dL (ref 1.7–2.4)

## 2015-01-24 LAB — EXPECTORATED SPUTUM ASSESSMENT W GRAM STAIN, RFLX TO RESP C

## 2015-01-24 LAB — EXPECTORATED SPUTUM ASSESSMENT W REFEX TO RESP CULTURE: SPECIAL REQUESTS: NORMAL

## 2015-01-24 LAB — PHOSPHORUS: Phosphorus: 4.6 mg/dL (ref 2.5–4.6)

## 2015-01-24 LAB — VANCOMYCIN, RANDOM
Vancomycin Rm: 30 ug/mL
Vancomycin Rm: 28 ug/mL

## 2015-01-24 LAB — TRIGLYCERIDES: Triglycerides: 91 mg/dL (ref <150)

## 2015-01-24 MED ORDER — EPINEPHRINE HCL 0.1 MG/ML IJ SOSY
PREFILLED_SYRINGE | INTRAMUSCULAR | Status: AC
  Administered 2015-01-24: 18:00:00
  Filled 2015-01-24: qty 20

## 2015-01-24 MED ORDER — METRONIDAZOLE IN NACL 5-0.79 MG/ML-% IV SOLN
500.0000 mg | Freq: Three times a day (TID) | INTRAVENOUS | Status: DC
  Administered 2015-01-24: 500 mg via INTRAVENOUS
  Filled 2015-01-24 (×3): qty 100

## 2015-01-24 MED ORDER — SODIUM BICARBONATE 8.4 % IV SOLN
INTRAVENOUS | Status: AC
  Administered 2015-01-24: 18:00:00
  Filled 2015-01-24: qty 100

## 2015-01-24 MED ORDER — VECURONIUM BROMIDE 10 MG IV SOLR: 10.0000 mg | INTRAVENOUS | Status: DC | PRN

## 2015-01-24 MED ORDER — CHLORHEXIDINE GLUCONATE 0.12 % MT SOLN
15.0000 mL | Freq: Two times a day (BID) | OROMUCOSAL | Status: DC
  Administered 2015-01-24: 15 mL via OROMUCOSAL

## 2015-01-24 MED ORDER — BUDESONIDE 0.5 MG/2ML IN SUSP
0.5000 mg | Freq: Two times a day (BID) | RESPIRATORY_TRACT | Status: DC
  Administered 2015-01-24: 0.5 mg via RESPIRATORY_TRACT
  Filled 2015-01-24: qty 2

## 2015-01-24 MED ORDER — IPRATROPIUM-ALBUTEROL 0.5-2.5 (3) MG/3ML IN SOLN
3.0000 mL | RESPIRATORY_TRACT | Status: DC
  Administered 2015-01-24 (×2): 3 mL via RESPIRATORY_TRACT
  Filled 2015-01-24 (×2): qty 3

## 2015-01-24 MED ORDER — SODIUM CHLORIDE 0.9 % IJ SOLN
10.0000 mL | Freq: Two times a day (BID) | INTRAMUSCULAR | Status: DC
  Administered 2015-01-24: 10 mL

## 2015-01-24 MED ORDER — FENTANYL 2500MCG IN NS 250ML (10MCG/ML) PREMIX INFUSION
25.0000 ug/h | INTRAVENOUS | Status: DC
  Administered 2015-01-24: 100 ug/h via INTRAVENOUS
  Administered 2015-01-24: 25 ug/h via INTRAVENOUS
  Filled 2015-01-24: qty 250

## 2015-01-24 MED ORDER — MIDAZOLAM HCL 2 MG/2ML IJ SOLN
2.0000 mg | INTRAMUSCULAR | Status: DC | PRN
  Administered 2015-01-24 (×2): 2 mg via INTRAVENOUS
  Filled 2015-01-24 (×2): qty 2

## 2015-01-24 MED ORDER — CETYLPYRIDINIUM CHLORIDE 0.05 % MT LIQD
7.0000 mL | Freq: Four times a day (QID) | OROMUCOSAL | Status: DC
  Administered 2015-01-24 (×2): 7 mL via OROMUCOSAL

## 2015-01-24 MED ORDER — FENTANYL BOLUS VIA INFUSION
10.0000 ug | INTRAVENOUS | Status: DC | PRN
  Filled 2015-01-24: qty 10

## 2015-01-24 MED ORDER — DEXTROSE 5 % IV SOLN
1.0000 g | Freq: Once | INTRAVENOUS | Status: DC
  Filled 2015-01-24: qty 1

## 2015-01-24 MED ORDER — SODIUM CHLORIDE 0.9 % IJ SOLN: 10.0000 mL | INTRAMUSCULAR | Status: DC | PRN

## 2015-01-24 MED ORDER — DEXTROSE 5 % IV SOLN: 1.0000 g | INTRAVENOUS | Status: DC

## 2015-01-24 MED ORDER — AMIODARONE HCL IN DEXTROSE 360-4.14 MG/200ML-% IV SOLN
30.0000 mg/h | INTRAVENOUS | Status: DC
  Filled 2015-01-24 (×2): qty 200

## 2015-01-24 MED ORDER — AMIODARONE HCL IN DEXTROSE 360-4.14 MG/200ML-% IV SOLN
60.0000 mg/h | INTRAVENOUS | Status: DC
  Administered 2015-01-24: 60 mg/h via INTRAVENOUS
  Filled 2015-01-24: qty 200

## 2015-01-24 MED ORDER — DOPAMINE-DEXTROSE 3.2-5 MG/ML-% IV SOLN
0.0000 ug/kg/min | INTRAVENOUS | Status: DC
  Administered 2015-01-24: 3 ug/kg/min via INTRAVENOUS

## 2015-01-25 LAB — MISC LABCORP TEST (SEND OUT): LABCORP TEST CODE: 6510

## 2015-01-25 LAB — CULTURE, BLOOD (ROUTINE X 2)

## 2015-01-25 SURGERY — EGD (ESOPHAGOGASTRODUODENOSCOPY)
Anesthesia: Monitor Anesthesia Care

## 2015-01-25 NOTE — Discharge Summary (Addendum)
This is a death summary on patient [name].  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 Belia HemanKasa - pulmonary critical care Dr. Mosetta PigeonHarmeet Singh - Nephrology Dr. Gilda CreaseSchnier - Vascular surgery Dr. Loretha BrasilZeylikman [facility]- Neurology  Hospital course  * Acute Resp. Failure - patient developed worsening respiratory failure secondary to pneumonia possibly aspiration and was being supported aggressively with the ventilator and empiric antibiotics with Vancomycin, Levaquin and Flagyl - Pulmonary was following the patient and helping us manage the ventilator   * Dialysis access site infection - BC as outpatient dialysis were + & growing Klebsiella.  - s/p Perm Cath removal by Dr schnier on 01/12/2015 - BC from 02/07/2015 & Cath tip cx 02/11/2015 remain (-) - [name] was status post temporary catheter placement and was getting Levaquin for the treatment for the dialysis access infection.  * End-stage renal disease on hemodialysis. - Nephro following and cont. HD as per them.   * Severe Sepsis - likely due to pneumonia as seen on CT chest and also on CXR 5/12, 5/13.  - Agent was significantly hemodynamically unstable and was getting aggressive IV vasopressors with levophed and vasopressin.  Patient was also getting broad-spectrum IV antibiotics with vancomycin and Levaquin and Flagyl. His blood cultures in the hospital were negative and his sputum cultures were also negative.  * AMS/Encephalopathy - this was thought to be multifactorial related to uremia/sepsis  but slow to improve.  - Patient did have CT head on admission which was (-). Patient was seen by neurology and he did not want to pursue any further imaging or EEG as they thought the likely cause of his encephalopathy was metabolic in nature  - Patient was getting broad-spectrum IV antibiotics for the sepsis and was also getting hemodialysis but his mental status was not improving.     * Leukocytosis - ?? Related to pneumonia/sepsis and WBC count was being followed.   * Diabetes type II w/out complication. - Patient is blood sugars remained stable on just sliding scale insulin  * Clostridium difficile colitis - patient was on oral Flagyl and his diarrhea was improving  Patient on the evening of 01/16/2015 went into PEA arrest.  A CODE BLUE was called patient was given epinephrine, and bicarbonate, and then went into ventricular tachycardia arrest and had to be shocked and given amiodarone. Patient also received CPR for several minutes and a pulse was regained.  Shortly thereafter patient went into pulseless electrical activity arrest again and received aggressive resuscitation.  The covering hospitalist had a discussion with the patient's brother about goals of care and his poor prognosis and after those discussions the brother did not want to further resuscitatate the patient any more and therefore the patient was made DO NOT RESUSCITATE.    Patient shortly thereafter passed away on the evening of 02/04/2015 the brother was made aware.

## 2015-01-26 LAB — CULTURE, RESPIRATORY W GRAM STAIN: Special Requests: NORMAL

## 2015-01-27 NOTE — Clinical Social Work Note (Signed)
Patient died over the weekend. York SpanielMonica Braedan Meuth MSW,LCSWA [phone]

## 2015-01-28 MED FILL — Medication: Qty: 1 | Status: AC

## 2015-01-29 LAB — CULTURE, BLOOD (ROUTINE X 2)
CULTURE: NO GROWTH
CULTURE: NO GROWTH
Special Requests: NORMAL
Special Requests: NORMAL

## 2015-02-12 NOTE — Progress Notes (Signed)
ANTIBIOTIC CONSULT NOTE - FOLLOW UP  Pharmacy Consult for [name] ReddenJerry [name] Indication: pneumonia  Allergies  Allergen Reactions   Penicillins Hives   Penicillin G Other (See Comments)    Patient Measurements: Height: 6\' 1"  (185.4 cm) Weight: 213 lb 3 oz (96.7 kg) IBW/kg (Calculated) : 79.9   Vital Signs: Temp: 100.2 F (37.9 C) (05/13 1200) Temp Source: Axillary (05/13 1200) BP: 104/69 mmHg (05/13 1300) Pulse Rate: 115 (05/13 1300) Intake/Output from previous day: 05/12 0701 - 05/13 0700 In: 1566.2 [I.V.:546.2; NG/GT:1020] Out: -500 [Stool:500] Intake/Output from this shift: Total I/O In: 9 [I.V.:9] Out: -   Labs:  Recent Labs  01/23/15 1425 01/23/15 1816 Apr 25, 2015 0519  WBC 20.1* 24.6* 28.3*  HGB 8.2* 8.1* 8.5*  PLT 160 134* 116*  CREATININE 6.04*   5.94* 4.63* 5.01*   Estimated Creatinine Clearance: 19.9 mL/min (by C-G formula based on Cr of 5.01).  Recent Labs  01/23/15 1709 Apr 25, 2015 0519  VANCORANDOM 30 28     Microbiology: Recent Results (from the past 720 hour(s))  Culture, blood (routine x 2)     Status: None   Collection Time: 01/23/2015 12:57 PM  Result Value Ref Range Status   Specimen Description BLOOD  Final   Special Requests BLOOD  Final   Culture NO GROWTH 5 DAYS  Final   Report Status 01/19/2015 FINAL  Final  Culture, blood (routine x 2)     Status: None   Collection Time: 01/23/2015 12:57 PM  Result Value Ref Range Status   Specimen Description BLOOD  Final   Special Requests BLOOD  Final   Culture NO GROWTH 5 DAYS  Final   Report Status 01/19/2015 FINAL  Final  Cath Tip Culture     Status: None   Collection Time: 02/03/2015  5:57 PM  Result Value Ref Range Status   Specimen Description CATH TIP  Final   Special Requests NONE  Final   Culture NO GROWTH 3 DAYS  Final   Report Status 01/18/2015 FINAL  Final  C difficile quick scan w PCR reflex [facility])     Status: None   Collection Time: 01/15/15 11:02 AM  Result Value Ref Range Status    C Diff antigen POSITIVE  Final   C Diff toxin NEGATIVE  Final   C Diff interpretation   Final    Positive for toxigenic C. difficile, active toxin production not detected. Patient has toxigenic C. difficile organisms present in the bowel, but toxin was not detected. The patient may be a carrier or the level of toxin in the sample was below the limit  of detection. This information should be used in conjunction with the patient's clinical history when deciding on possible therapy.     Comment: CRITICAL RESULT CALLED TO, READ BACK BY AND VERIFIED WITH: [facility] WITH[facility] @ 1400 01/15/15 BGB   Clostridium Difficile by PCR     Status: Abnormal   Collection Time: 01/15/15 11:02 AM  Result Value Ref Range Status   C difficile by pcr POSITIVE (A) NEGATIVE Final  Culture, blood (routine x 2)     Status: None   Collection Time: 01/17/15 11:03 PM  Result Value Ref Range Status   Specimen Description BLOOD  Final   Special Requests BLOOD  Final   Culture NO GROWTH 5 DAYS  Final   Report Status 01/22/2015 FINAL  Final  Culture, blood (routine x 2)     Status: None (Preliminary result)   Collection Time: 01/17/15 11:03 PM  Result Value Ref Range Status   Specimen Description BLOOD  Final   Special Requests BLOOD  Final   Report Status PENDING  Incomplete  Culture, expectorated sputum-assessment     Status: None   Collection Time: 01/26/2015 11:55 AM  Result Value Ref Range Status   Specimen Description EXPECTORATED SPUTUM  Final   Special Requests Normal  Final   Sputum evaluation THIS SPECIMEN IS ACCEPTABLE FOR SPUTUM CULTURE  Final   Report Status 01/23/2015 FINAL  Final    Anti-infectives    Start     Dose/Rate Route Frequency Ordered Stop   02/03/2015 1500  metroNIDAZOLE (FLAGYL) IVPB 500 mg     500 mg 100 mL/hr over 60 Minutes Intravenous Every 8 hours 01/29/2015 1109     01/22/15 0000  vancomycin (VANCOCIN) IVPB 1000 mg/200 mL premix     1,000 mg 200 mL/hr over 60 Minutes Intravenous  Every T-Th-Sa (Hemodialysis) 01/21/15 2249     01/21/15 1545  metroNIDAZOLE (FLAGYL) IVPB 500 mg  Status:  Discontinued     500 mg 100 mL/hr over 60 Minutes Intravenous Every 8 hours 01/21/15 1533 01/21/15 1927   01/21/15 1545  metroNIDAZOLE (FLAGYL) tablet 500 mg  Status:  Discontinued     500 mg Oral 3 times per day 01/21/15 1533 02/04/2015 1109   01/18/15 1515  levofloxacin (LEVAQUIN) IVPB 500 mg  Status:  Discontinued     500 mg 100 mL/hr over 60 Minutes Intravenous Every 48 hours 01/18/15 1505 01/22/2015 1044   01/18/15 1106  vancomycin (VANCOCIN) IVPB 1000 mg/200 mL premix  Status:  Discontinued     1,000 mg 200 mL/hr over 60 Minutes Intravenous Every Dialysis 01/18/15 1115 01/21/15 2249   01/18/15 1100  metroNIDAZOLE (FLAGYL) IVPB 500 mg  Status:  Discontinued     500 mg 100 mL/hr over 60 Minutes Intravenous Every 8 hours 01/18/15 1053 01/21/15 1533   01/15/15 1445  metroNIDAZOLE (FLAGYL) tablet 500 mg  Status:  Discontinued     500 mg Oral 3 times per day 01/15/15 1436 01/18/15 1053   Apr 26, 2015 1300  vancomycin (VANCOCIN) 2,000 mg in sodium chloride 0.9 % 500 mL IVPB     2,000 mg 250 mL/hr over 120 Minutes Intravenous  Once Apr 26, 2015 1249 Apr 26, 2015 1844      Assessment: Vancomycin trough not drawn with dialysis. Subsequent random levels appear appropriate.  Goal of Therapy:  Patient on dialysis, vancomycin goal 15-25.  Plan:  Will continue current vancomycin dosing and schedule a trough to be drawn before dialysis on 5/19.  Ananias Pilgrimndrew S Norman 02/09/2015,3:00 PM

## 2015-02-12 NOTE — Progress Notes (Signed)
1820  Pt hr dropping to 10-20 and bp 30's. despite dopamine,levophed, vasopressin.

## 2015-02-12 NOTE — Progress Notes (Signed)
Clinical Update: pt coded and now intubated on vent; TF on hold; per Belia HemanKasa, MD, patient to have 24hr bowel rest before resuming feeds.  Current Diet: Tube Feeding- on hold Typical Food/ Fluid Intake: TF on hold  Weight Changes: Reviewed  Labs:Electrolyte and Renal Profile:    Recent Labs Lab 01/19/15 0500 01/19/15 0915  01/22/15 0054 01/23/15 1425 01/23/15 1816 2014/10/22 0519  BUN 33*  --   < > 42* 58*  61* 43* 51*  CREATININE 6.06*  --   < > 5.28* 6.04*  5.94* 4.63* 5.01*  NA 138  --   < > 142 138  139 142 142  K 4.0  --   < > 3.4* 3.4*  3.3* 3.5 3.9  MG  --  2.3  --   --  2.2 2.2  --   PHOS 4.9*  --   --  2.8 2.0*  --   --   < > = values in this interval not displayed. Protein Profile:  Recent Labs Lab 01/22/15 0054 01/23/15 1425 01/23/15 1816  ALBUMIN 1.8* 1.7* 1.7*   Glucose Profile:  Recent Labs  2014/10/22 0439 2014/10/22 0856 2014/10/22 1200  GLUCAP 139* 162* 166*    Meds: Levophed, Fentanyl, MVI,   Physical Findings: on vent  Skin: no concerns at this time  PES: Inadequate energy intake as evidenced by pt on vent, NPO, and TF on hold  Interventions: EN: Will resume TF as medically appropriate per MD order after 24 hrs bowel rest  MONITOR: EN: goal for advancement to goal rate as medically appropriate Electrolyte and renal profile Pulmonary Profile Glucose profile Digestive system UOP Anthropometrics  [name], RDN Pager: [phone] Office: [phone]  HIGH Care Level

## 2015-02-12 NOTE — Progress Notes (Signed)
1830, Pt now PEA again, pt is DNR per family request.  Venilator and vasopressors turned off.  Pt expired, asystole, no bp or respirations.  Corrin Parkerhristina Austin RN verified these findings.  Notified Dr. Juliene PinaMody and nursing supervisor.  Dr. Juliene PinaMody notified pt brother Rhetta Muraerry Laplant.  Per Aurther Lofterry, family is unsure of funeral home arrangements, will call back tomorrow with arrangements.

## 2015-02-12 NOTE — Consult Note (Signed)
PULMONARY / CRITICAL CARE MEDICINE   Name: [name] MRN: [id number] DOB: Jun 25, 1956    ADMISSION DATE:  02/04/2015    CHIEF COMPLAINT:   Acute resp failure   HISTORY OF PRESENT ILLNESS   59 year old male with a known history of end-stage renal disease on hemodialysis. admitted 10 days ago for pus around his right PermCath.. He did get his PermCath removed by Dr. Wyn Quaker in the ER and he is being admitted for further evaluation and management fro acute septic shock, patient has been lethargic since admission, patient had acute cardiac arrest and CODe BLue called after patient with acute hypoxia and after he was intubated.  Patient subsequently developed C diff colitis.  Patient now on full vent support, on multiple vasopressors, critically ill, high risk for death    SIGNIFICANT EVENTS   c dfii colitis S/p code blue 5/12    PAST MEDICAL HISTORY    :  Past Medical History  Diagnosis Date  . Diabetes   . End stage renal disease on dialysis   . Anemia    History reviewed. No pertinent past surgical history. Prior to Admission medications   Medication Sig Start Date End Date Taking? Authorizing Provider  calcium acetate, Phos Binder, (PHOSLYRA) 667 MG/5ML SOLN Take 1,334 mg by mouth 3 (three) times daily. On Monday and Wednesday   Yes Historical Provider, MD  docusate sodium (COLACE) 100 MG capsule Take 100 mg by mouth 2 (two) times daily.   Yes Historical Provider, MD  doxepin (SINEQUAN) 50 MG capsule Take 50 mg by mouth at bedtime.   Yes Historical Provider, MD  DULoxetine (CYMBALTA) 60 MG capsule Take 60 mg by mouth daily.   Yes Historical Provider, MD  ezetimibe (ZETIA) 10 MG tablet Take 10 mg by mouth daily.   Yes Historical Provider, MD  famotidine (PEPCID) 20 MG tablet Take 20 mg by mouth 2 (two) times daily.   Yes Historical Provider, MD  fentaNYL (DURAGESIC - DOSED MCG/HR) 50 MCG/HR Place 50 mcg onto the skin every 3 (three) days.   Yes Historical Provider, MD   fluticasone (FLONASE) 50 MCG/ACT nasal spray Place 1 spray into both nostrils daily.   Yes Historical Provider, MD  gabapentin (NEURONTIN) 300 MG capsule Take 300 mg by mouth 4 (four) times daily.   Yes Historical Provider, MD  glipiZIDE (GLUCOTROL) 10 MG tablet Take 10 mg by mouth 2 (two) times daily before a meal.   Yes Historical Provider, MD  hydrALAZINE (APRESOLINE) 25 MG tablet Take 25 mg by mouth 4 (four) times daily.   Yes Historical Provider, MD  hydrocortisone cream 1 % Apply 1 application topically 4 (four) times daily.   Yes Historical Provider, MD  hydrOXYzine (ATARAX/VISTARIL) 25 MG tablet Take 25 mg by mouth every 6 (six) hours as needed for itching.   Yes Historical Provider, MD  insulin aspart (NOVOLOG) 100 UNIT/ML injection Inject 2-10 Units into the skin 3 (three) times daily before meals. When blood sugars are 150-400   Yes Historical Provider, MD  insulin detemir (LEVEMIR) 100 UNIT/ML injection Inject 18 Units into the skin at bedtime.   Yes Historical Provider, MD  lactulose (CHRONULAC) 10 GM/15ML solution Take 10 g by mouth 3 (three) times daily.   Yes Historical Provider, MD  levothyroxine (SYNTHROID, LEVOTHROID) 125 MCG tablet Take 125 mcg by mouth daily before breakfast.   Yes Historical Provider, MD  metoprolol (LOPRESSOR) 50 MG tablet Take 50 mg by mouth 2 (two) times daily.   Yes Historical  Provider, MD  montelukast (SINGULAIR) 10 MG tablet Take 10 mg by mouth at bedtime.   Yes Historical Provider, MD  multivitamin (RENA-VIT) TABS tablet Take 1 tablet by mouth daily.   Yes Historical Provider, MD  omeprazole (PRILOSEC) 40 MG capsule Take 40 mg by mouth daily.   Yes Historical Provider, MD  oxybutynin (DITROPAN) 5 MG tablet Take 5 mg by mouth 2 (two) times daily.   Yes Historical Provider, MD  oxyCODONE (OXY IR/ROXICODONE) 5 MG immediate release tablet Take 5 mg by mouth every 12 (twelve) hours as needed for severe pain.   Yes Historical Provider, MD  polyethylene glycol  (MIRALAX / GLYCOLAX) packet Take 17 g by mouth daily.   Yes Historical Provider, MD  Umeclidinium Bromide 62.5 MCG/INH AEPB Inhale 1 Dose into the lungs daily.   Yes Historical Provider, MD  warfarin (COUMADIN) 2 MG tablet Take 2 mg by mouth daily at 6 PM.   Yes Historical Provider, MD  AURYXIA 210 MG TABS  01/10/15   Historical Provider, MD   Allergies  Allergen Reactions  . Penicillins Hives  . Penicillin G Other (See Comments)     FAMILY HISTORY   History reviewed. No pertinent family history.    SOCIAL HISTORY    reports that he has never smoked. He does not have any smokeless tobacco history on file. He reports that he does not drink alcohol or use illicit drugs.  Review of Systems  Unable to perform ROS: critical illness      VITAL SIGNS    Temp:  [98.2 F (36.8 C)-101.4 F (38.6 C)] 100.6 F (38.1 C) (05/13 0852) Pulse Rate:  [102-119] 105 (05/13 0800) Resp:  [19-28] 23 (05/13 0800) BP: (60-124)/(39-89) 112/69 mmHg (05/13 0800) SpO2:  [90 %-100 %] 100 % (05/13 0800) FiO2 (%):  [100 %] 100 % (05/13 0740) Weight:  [212 lb 11.9 oz (96.5 kg)-213 lb 3 oz (96.7 kg)] 213 lb 3 oz (96.7 kg) (05/13 0500) HEMODYNAMICS:   VENTILATOR SETTINGS: Vent Mode:  [-] PRVC FiO2 (%):  [100 %] 100 % Set Rate:  [24 bmp] 24 bmp Vt Set:  [550 mL] 550 mL PEEP:  [8 cmH20] 8 cmH20 INTAKE / OUTPUT:  Intake/Output Summary (Last 24 hours) at Feb 01, 2015 1055 Last data filed at 02-01-2015 0700  Gross per 24 hour  Intake 1502.12 ml  Output   -500 ml  Net 2002.12 ml       PHYSICAL EXAM   Physical Exam  Constitutional: He appears distressed.  HENT:  Head: Normocephalic and atraumatic.  Eyes: Pupils are equal, round, and reactive to light. No scleral icterus.  Neck: Normal range of motion. Neck supple.  Cardiovascular: Normal rate and regular rhythm.   No murmur heard. Pulmonary/Chest: He is in respiratory distress. He has no wheezes. He has rales.  resp distress  Abdominal:  Soft. He exhibits distension. There is no tenderness.  Musculoskeletal: He exhibits no edema.  Neurological: He displays normal reflexes. Coordination normal.  gcs<8T  Skin: Skin is warm. No rash noted. He is diaphoretic.       LABS   LABS:  CBC  Recent Labs Lab 01/23/15 1425 01/23/15 1816 Feb 01, 2015 0519  WBC 20.1* 24.6* 28.3*  HGB 8.2* 8.1* 8.5*  HCT 27.5* 27.9* 28.6*  PLT 160 134* 116*   Coag's No results for input(s): APTT, INR in the last 168 hours. BMET  Recent Labs Lab 01/23/15 1425 01/23/15 1816 01-Feb-2015 0519  NA 138  139 142 142  K  3.4*  3.3* 3.5 3.9  CL 99*  102 102 103  CO2 BUN 58*  61* 43* 51*  CREATININE 6.04*  5.94* 4.63* 5.01*  GLUCOSE 310*  281* 197* 166*   Electrolytes  Recent Labs Lab 01/19/15 0500 01/19/15 0915  01/22/15 0054 01/23/15 1425 01/23/15 1816 01/18/2015 0519  CALCIUM 8.6*  --   < > 9.0 9.0  9.1 8.7* 9.0  MG  --  2.3  --   --  2.2 2.2  --   PHOS 4.9*  --   --  2.8 2.0*  --   --   < > = values in this interval not displayed. Sepsis Markers No results for input(s): LATICACIDVEN, PROCALCITON, O2SATVEN in the last 168 hours. ABG  Recent Labs Lab 01/18/15 1515 01/23/15 1740 02/02/2015 0920  PHART 7.35 7.34* 7.51*  PCO2ART 50* 51* 34  PO2ART 53* 37* 118*   Liver Enzymes  Recent Labs Lab 01/17/15 2250  01/22/15 0054 01/23/15 1425 01/23/15 1816  AST 29  --   --   --  90*  ALT 14*  --   --   --  17  ALKPHOS 82  --   --   --  132*  BILITOT 1.2  --   --   --  0.9  ALBUMIN 2.2*  2.2*  < > 1.8* 1.7* 1.7*  < > = values in this interval not displayed. Cardiac Enzymes  Recent Labs Lab 01/23/15 1816 01/22/2015 0039 01/23/2015 0519  TROPONINI 0.66* 0.83* 1.00*   Glucose  Recent Labs Lab 01/23/15 1136 01/23/15 1547 01/23/15 2232 01/30/2015 0021 02/05/2015 0439 01/18/2015 0856  GLUCAP 195* 188* 134* 144* 139* 162*     Recent Results (from the past 240 hour(s))  Culture, blood (routine x 2)      Status: None   Collection Time: 01/23/2015 12:57 PM  Result Value Ref Range Status   Specimen Description BLOOD  Final   Special Requests BLOOD  Final   Culture NO GROWTH 5 DAYS  Final   Report Status 01/19/2015 FINAL  Final  Culture, blood (routine x 2)     Status: None   Collection Time: 01/26/2015 12:57 PM  Result Value Ref Range Status   Specimen Description BLOOD  Final   Special Requests BLOOD  Final   Culture NO GROWTH 5 DAYS  Final   Report Status 01/19/2015 FINAL  Final  Cath Tip Culture     Status: None   Collection Time: 02/05/2015  5:57 PM  Result Value Ref Range Status   Specimen Description CATH TIP  Final   Special Requests NONE  Final   Culture NO GROWTH 3 DAYS  Final   Report Status 01/18/2015 FINAL  Final  C difficile quick scan w PCR reflex [facility])     Status: None   Collection Time: 01/15/15 11:02 AM  Result Value Ref Range Status   C Diff antigen POSITIVE  Final   C Diff toxin NEGATIVE  Final   C Diff interpretation   Final    Positive for toxigenic C. difficile, active toxin production not detected. Patient has toxigenic C. difficile organisms present in the bowel, but toxin was not detected. The patient may be a carrier or the level of toxin in the sample was below the limit  of detection. This information should be used in conjunction with the patient's clinical history when deciding on possible therapy.     Comment: CRITICAL RESULT CALLED TO,  READ BACK BY AND VERIFIED WITH: Zettie CooleyMARY WITHERSPOON @ 1400 01/15/15 BGB   Clostridium Difficile by PCR     Status: Abnormal   Collection Time: 01/15/15 11:02 AM  Result Value Ref Range Status   C difficile by pcr POSITIVE (A) NEGATIVE Final  Culture, blood (routine x 2)     Status: None   Collection Time: 01/17/15 11:03 PM  Result Value Ref Range Status   Specimen Description BLOOD  Final   Special Requests BLOOD  Final   Culture NO GROWTH 5 DAYS  Final   Report Status 01/22/2015 FINAL  Final  Culture, blood (routine x 2)      Status: None (Preliminary result)   Collection Time: 01/17/15 11:03 PM  Result Value Ref Range Status   Specimen Description BLOOD  Final   Special Requests BLOOD  Final   Report Status PENDING  Incomplete      IMAGING    Dg Chest 1 View  01/23/2015   CLINICAL DATA:  Endotracheal tube placement  EXAM: CHEST  1 VIEW  COMPARISON:  01/18/2015  FINDINGS: Endotracheal tube with tip at the level of thoracic inlet T1 level. There is no pneumothorax worsening infiltrate/pneumonia in right lower lobe. NG tube in place.  IMPRESSION: Endotracheal tube with tip at the level of thoracic inlet T1 level. Advancement in mid trachea is recommended. No pneumothorax. Worsening infiltrate/pneumonia in right lower lobe. NG tube in place.   Electronically Signed   By: Natasha MeadLiviu  Pop M.D.   On: 01/23/2015 18:34   Dg Chest Port 1 View  01/23/2015   CLINICAL DATA:  Central line placement  EXAM: PORTABLE CHEST - 1 VIEW  COMPARISON:  01/23/2015 at at 18:19  FINDINGS: There is a new left jugular central line with tip in the SVC. There is no pneumothorax. Endotracheal tube tip is 4 cm above the carina. Nasogastric tube extends well into the stomach. Bilateral airspace opacities have worsened, particularly in the central left lung and in both bases. This likely represents worsening alveolar edema. Bilateral pleural effusions are likely present.  IMPRESSION: Support equipment appears satisfactorily positioned.  Worsening alveolar edema and developing pleural effusions.   Electronically Signed   By: Ellery Plunkaniel R Mitchell M.D.   On: 01/23/2015 21:39                         ASSESSMENT/PLAN   59 yo AAM admitted to ICU initially for c diff colitis with infected perm cath with septic shock with acute cardiac arrest and code blue from acute aspiration pneumonia   1.Acute Resp. Failure - likely due to pneumonia as seen on CXR.  -continue Full MV support -continue Bronchodilator Therapy -Wean Fio2 and PEEP as  tolerated  2.PEA Arrest - pt. Had PEA arrest yesterday after being intubated.  - likely due to hypoxia from pneumonia/resp. Failure.  - improved w/ epi, Bicarb and no further episodes since then.  - will check 2-D Echo.   3.Dialysis access site infection - BC as outpatient dialysis were + & growing Klebsiella.  -on iv abx -recommend ID consult  4. End-stage renal disease on hemodialysis. - Nephro following and cont. HD as needed   5.Septic shock -use vasopressors to keep MAP>65 -follow ABG and LA -follow up cultures -emperic ABX -consider stress dose steroids  6.aspiration pneumonia -continue abx -obtain sputum cultures  7.Clostridium difficile colitis - diarrhea improving.  -change to IV flagyl  8.probable ileus -hold Tf/s for now   Patient with  Multiorgan failure, prognosis is poor. Palliative care team consulted    I have personally obtained a history, examined the patient, evaluated laboratory and imaging results, formulated the assessment and plan and placed orders.  The Patient requires high complexity decision making for assessment and support, frequent evaluation and titration of therapies, application of advanced monitoring technologies and extensive interpretation of multiple databases. Critical Care Time devoted to patient care services described in this note is 50 minutes.   Overall, patient is critically ill, prognosis is guarded. Patient at high risk for cardiac arrest and death.   Lucie LeatherKurian [name] Shalayna Ornstein, M.D. Pulmonary & Critical Care Medicine [facility] Medical Director Intensive Care Unit   01/16/2015, 10:55 AM

## 2015-02-12 NOTE — Progress Notes (Signed)
PT Cancellation Note  Patient Details Name: [name] MRN: [name]060 DOB: 05/14/1956   Cancelled Treatment:    Reason Eval/Treat Not Completed: Medical issues which prohibited therapy (Per chart review, patient noted with decline in respiratory status requiring emergent intubation/sedation previous date.  Will discontinue initial order due to change in status; please re-order as medically appropriate.)  Rhythm Gubbels H. Manson PasseyBrown, PT, DPT 05-22-2015, 9:12 AM [id number]

## 2015-02-12 NOTE — Progress Notes (Signed)
[facility] Department of Emergency Medicine   Code Blue CONSULT NOTE  Chief Complaint: Cardiac arrest/unresponsive   Level V Caveat: Unresponsive  History of present illness: I was contacted by the hospital for a CODE BLUE cardiac arrest upstairs and presented to the patient's bedside.   The patient was admitted for sepsis and was intubated yesterday during another CODE BLUE event.  Today a CODE BLUE was activated when the nurse, who was in the room with him, observed him become bradycardic and then lose his pulse, going into a PEA arrest.  ROS: Unable to obtain, Level V caveat  Scheduled Meds: Continuous Infusions: PRN Meds:. Past Medical History  Diagnosis Date  . Diabetes   . End stage renal disease on dialysis   . Anemia    No past surgical history on file. History   Social History  . Marital Status: Single    Spouse Name: N/A  . Number of Children: N/A  . Years of Education: N/A   Occupational History  . Not on file.   Social History Main Topics  . Smoking status: Never Smoker   . Smokeless tobacco: Not on file  . Alcohol Use: No  . Drug Use: No  . Sexual Activity: Not on file   Other Topics Concern  . Not on file   Social History Narrative  . No narrative on file   Allergies  Allergen Reactions  . Penicillins Hives  . Penicillin G Other (See Comments)    Last set of Vital Signs (not current) There were no vitals filed for this visit.    Physical Exam  Gen: unresponsive Cardiovascular: pulseless with sinus tach on monitor (PEA) Resp: apneic. Breath sounds equal bilaterally with bagging  Abd: nondistended  Neuro: GCS 3, unresponsive to pain  Musculoskeletal: No deformity  Skin: warm  Procedures   CRITICAL CARE Performed by: Loleta RoseFORBACH, Deannie Resetar Total critical care time: 5 minutes Critical care time was exclusive of separately billable procedures and treating other patients. Critical care was necessary to treat or prevent  imminent or life-threatening deterioration. Critical care was time spent personally by me on the following activities: development of treatment plan with patient and/or surrogate as well as nursing, discussions with consultants, evaluation of patient's response to treatment, examination of patient, obtaining history from patient or surrogate, ordering and performing treatments and interventions, ordering and review of laboratory studies, ordering and review of radiographic studies, pulse oximetry and re-evaluation of patient's condition.  Cardiopulmonary Resuscitation (CPR) Procedure Note  Directed/Performed by: Loleta RoseFORBACH, Valrie Jia I personally directed ancillary staff and/or performed CPR in an effort to regain return of spontaneous circulation and to maintain cardiac, neuro and systemic perfusion.     Assessment and Plan  The patient had received 1 round of epinephrine and atropine prior to my arrival, and the patient was receiving chest compressions.  He was given 1 amp of sodium bicarbonate and was placed on the Zoll.  At the time that Dr. Juliene PinaMody (hospitalist) arrived to take over the code, the patient was observed to be in ventricular tachycardia and was defibrillated per Dr. Camillia HerterMody's request.  I left the CCU after Dr. Juliene PinaMody took over while CPR was still in progress.

## 2015-02-12 NOTE — Progress Notes (Signed)
Speech Therapy Visit Note: pt had a decline in status and is now orally intubated on full vent support. ST will hold on services until appropriate. NSG to consult ST when pt is appropriate to eval/tx.

## 2015-02-12 NOTE — Progress Notes (Signed)
Spoke with Dr. Anne HahnWillis about Pt's Troponin levels of 0.66 and 0.83. Another troponin will be check at 0600 and we will continue to monitor.

## 2015-02-12 NOTE — Progress Notes (Signed)
At 1700, pt hr rhythm changed to a-fib in 80's, then brady down to 30's with PEA.  Code blue called, cpr started, see code blue sheet for details.

## 2015-02-12 NOTE — Progress Notes (Signed)
Subjective:   Patient was hypotensive last night after dialysis. Coded last evening. Intubated/ 5-12; placed on vent support High dose of pressors Thought to have aspirated Now has fevers TF on hold   Objective:  Vital signs in last 24 hours:  Temp:  [98.2 F (36.8 C)-101.4 F (38.6 C)] 100.6 F (38.1 C) (05/13 0852) Pulse Rate:  [102-119] 105 (05/13 0800) Resp:  [19-28] 23 (05/13 0800) BP: (60-124)/(39-89) 112/69 mmHg (05/13 0800) SpO2:  [90 %-100 %] 100 % (05/13 0800) FiO2 (%):  [100 %] 100 % (05/13 0740) Weight:  [96.5 kg (212 lb 11.9 oz)-96.7 kg (213 lb 3 oz)] 96.7 kg (213 lb 3 oz) (05/13 0500)  Weight change: 0 kg (0 lb) Filed Weights   01/23/15 0601 01/23/15 1410 02/03/2015 0500  Weight: 96.5 kg (212 lb 11.9 oz) 96.5 kg (212 lb 11.9 oz) 96.7 kg (213 lb 3 oz)    Intake/Output: I/O last 3 completed shifts: In: 2270.2 [I.V.:603.2; NG/GT:1667] Out: -500 [Stool:500]   Intake/Output this shift:     Physical Exam: General: Critically ill, lethargic  Head: dry oral mucosal membranes, ETT  Eyes: Muddy sclera  Neck: Supple, trachea midline  Lungs:  Vent assisted.  Heart: irregular rate and rhythm  Abdomen:  Mild guarding  Extremities:  no peripheral edema.  Neurologic: sedated   Skin: No lesions, decreased turgor  Access: femoral trialysis catheter 5/6    Basic Metabolic Panel:  Recent Labs Lab 01/17/15 2250 01/18/15 1720 01/19/15 0500 01/19/15 0915 01/21/15 1559 01/22/15 0054 01/23/15 1425 01/23/15 1816 02/08/2015 0519  NA 135 133* 138  --  138 142 138  139 142 142  K 4.9 4.5 4.0  --  4.0 3.4* 3.4*  3.3* 3.5 3.9  CL 96* 96* 97*  --  102 103 99*  102 102 103  CO2 --  GLUCOSE 128* 147* 136*  --  193* 194* 310*  281* 197* 166*  BUN 67* 44* 33*  --  54* 42* 58*  61* 43* 51*  CREATININE 12.03* 8.11* 6.06*  --  6.61* 5.28* 6.04*  5.94* 4.63* 5.01*  CALCIUM 8.5* 8.3* 8.6*  --  8.6* 9.0 9.0  9.1 8.7* 9.0  MG  --   --   --   2.3  --   --  2.2 2.2  --   PHOS 7.8* 6.1* 4.9*  --   --  2.8 2.0*  --   --     Liver Function Tests:  Recent Labs Lab 01/17/15 2250 01/18/15 1720 01/19/15 0500 01/22/15 0054 01/23/15 1425 01/23/15 1816  AST 29  --   --   --   --  90*  ALT 14*  --   --   --   --  17  ALKPHOS 82  --   --   --   --  132*  BILITOT 1.2  --   --   --   --  0.9  PROT 6.4*  --   --   --   --  5.6*  ALBUMIN 2.2*  2.2* 1.9* 1.9* 1.8* 1.7* 1.7*   No results for input(s): LIPASE, AMYLASE in the last 168 hours.  Recent Labs Lab 01/23/15 1500  AMMONIA 27    CBC:  Recent Labs Lab 01/22/15 0649 01/23/15 0638 01/23/15 1425 01/23/15 1816 01/13/2015 0519  WBC 19.8* 21.9* 20.1* 24.6* 28.3*  NEUTROABS  --   --   --  20.1*  --   HGB 8.0* 8.5* 8.2* 8.1* 8.5*  HCT 26.3* 27.4* 27.5* 27.9* 28.6*  MCV 87.7 88.2 89.4 91.4 88.7  PLT 178 186 160 134* 116*    Cardiac Enzymes:  Recent Labs Lab 01/23/15 1816 Feb 16, 2015 0039 Feb 16, 2015 0519  TROPONINI 0.66* 0.83* 1.00*    BNP: Invalid input(s): POCBNP  CBG:  Recent Labs Lab 01/23/15 1547 01/23/15 2232 Feb 16, 2015 0021 Feb 16, 2015 0439 Feb 16, 2015 0856  GLUCAP 188* 134* 144* 139* 162*    Microbiology: Results for orders placed or performed during the hospital encounter of 01/15/2015  Culture, blood (routine x 2)     Status: None   Collection Time: 01/13/2015 12:57 PM  Result Value Ref Range Status   Specimen Description BLOOD  Final   Special Requests BLOOD  Final   Culture NO GROWTH 5 DAYS  Final   Report Status 01/19/2015 FINAL  Final  Culture, blood (routine x 2)     Status: None   Collection Time: 02/07/2015 12:57 PM  Result Value Ref Range Status   Specimen Description BLOOD  Final   Special Requests BLOOD  Final   Culture NO GROWTH 5 DAYS  Final   Report Status 01/19/2015 FINAL  Final  Cath Tip Culture     Status: None   Collection Time: 01/26/2015  5:57 PM  Result Value Ref Range Status   Specimen Description CATH TIP  Final   Special  Requests NONE  Final   Culture NO GROWTH 3 DAYS  Final   Report Status 01/18/2015 FINAL  Final  C difficile quick scan w PCR reflex [facility])     Status: None   Collection Time: 01/15/15 11:02 AM  Result Value Ref Range Status   C Diff antigen POSITIVE  Final   C Diff toxin NEGATIVE  Final   C Diff interpretation   Final    Positive for toxigenic C. difficile, active toxin production not detected. Patient has toxigenic C. difficile organisms present in the bowel, but toxin was not detected. The patient may be a carrier or the level of toxin in the sample was below the limit  of detection. This information should be used in conjunction with the patient's clinical history when deciding on possible therapy.     Comment: CRITICAL RESULT CALLED TO, READ BACK BY AND VERIFIED WITH: Zettie CooleyMARY WITHERSPOON @ 1400 01/15/15 BGB   Clostridium Difficile by PCR     Status: Abnormal   Collection Time: 01/15/15 11:02 AM  Result Value Ref Range Status   C difficile by pcr POSITIVE (A) NEGATIVE Final  Culture, blood (routine x 2)     Status: None   Collection Time: 01/17/15 11:03 PM  Result Value Ref Range Status   Specimen Description BLOOD  Final   Special Requests BLOOD  Final   Culture NO GROWTH 5 DAYS  Final   Report Status 01/22/2015 FINAL  Final  Culture, blood (routine x 2)     Status: None (Preliminary result)   Collection Time: 01/17/15 11:03 PM  Result Value Ref Range Status   Specimen Description BLOOD  Final   Special Requests BLOOD  Final   Report Status PENDING  Incomplete    Coagulation Studies: No results for input(s): LABPROT, INR in the last 72 hours.  Urinalysis: No results for input(s): COLORURINE, LABSPEC, PHURINE, GLUCOSEU, HGBUR, BILIRUBINUR, KETONESUR, PROTEINUR, UROBILINOGEN, NITRITE, LEUKOCYTESUR in the last 72 hours.  Invalid input(s): APPERANCEUR    Imaging: Dg Chest 1 View  01/23/2015   CLINICAL  DATA:  Endotracheal tube placement  EXAM: CHEST  1 VIEW  COMPARISON:   01/18/2015  FINDINGS: Endotracheal tube with tip at the level of thoracic inlet T1 level. There is no pneumothorax worsening infiltrate/pneumonia in right lower lobe. NG tube in place.  IMPRESSION: Endotracheal tube with tip at the level of thoracic inlet T1 level. Advancement in mid trachea is recommended. No pneumothorax. Worsening infiltrate/pneumonia in right lower lobe. NG tube in place.   Electronically Signed   By: Natasha MeadLiviu  Pop M.D.   On: 01/23/2015 18:34   Dg Chest Port 1 View  02/06/2015   CLINICAL DATA:  Hypoxia  EXAM: PORTABLE CHEST - 1 VIEW  COMPARISON:  Jan 23, 2015  FINDINGS: The endotracheal tube tip is 5.2 cm above the carina. Central catheter tip is in the superior cava. Nasogastric tube tip and side port are in the stomach. No apparent pneumothorax. There is persistent airspace consolidation with volume loss in the right lower lobe. There is underlying interstitial and patchy alveolar edema bilaterally, primarily in the perihilar regions. Heart is enlarged. The pulmonary vascularity is within normal limits. No new opacity. No adenopathy.  IMPRESSION: Tube and catheter positions as described without pneumothorax. Persistent airspace consolidation right lower lobe, likely pneumonia. Perihilar edema and cardiomegaly are indicative of underlying congestive heart failure.   Electronically Signed   By: Bretta BangWilliam  Woodruff III M.D.   On: 01/16/2015 07:26   Dg Chest Port 1 View  01/23/2015   CLINICAL DATA:  Central line placement  EXAM: PORTABLE CHEST - 1 VIEW  COMPARISON:  01/23/2015 at at 18:19  FINDINGS: There is a new left jugular central line with tip in the SVC. There is no pneumothorax. Endotracheal tube tip is 4 cm above the carina. Nasogastric tube extends well into the stomach. Bilateral airspace opacities have worsened, particularly in the central left lung and in both bases. This likely represents worsening alveolar edema. Bilateral pleural effusions are likely present.  IMPRESSION: Support  equipment appears satisfactorily positioned.  Worsening alveolar edema and developing pleural effusions.   Electronically Signed   By: Ellery Plunkaniel R Mitchell M.D.   On: 01/23/2015 21:39     Medications:   . fentaNYL infusion INTRAVENOUS Stopped (01/23/15 2200)  . norepinephrine (LEVOPHED) Adult infusion 32.96 mcg/min (02/05/2015 0846)  . propofol (DIPRIVAN) infusion 10.363 mcg/kg/min (02/04/2015 0846)  . vasopressin (PITRESSIN) infusion - *FOR SHOCK* 0.03 Units/min (02/05/2015 0846)   . DULoxetine  60 mg Oral Daily  . epoetin (EPOGEN/PROCRIT) injection  10,000 Units Subcutaneous Weekly  . famotidine  10 mg Oral Daily  . feeding supplement (NEPRO CARB STEADY)  1,000 mL Per Tube Q24H  . fluticasone  1 spray Each Nare Daily  . insulin aspart  0-9 Units Subcutaneous 6 times per day  . levofloxacin (LEVAQUIN) IV  500 mg Intravenous Q48H  . levothyroxine  125 mcg Oral QAC breakfast  . metroNIDAZOLE  500 mg Oral 3 times per day  . midazolam  4 mg Intravenous Once  . montelukast  10 mg Oral QHS  . multivitamin  1 tablet Oral Daily  . pantoprazole sodium  40 mg Per Tube Daily  . vancomycin  1,000 mg Intravenous Q T,Th,Sa-HD   acetaminophen **OR** acetaminophen, fentaNYL (SUBLIMAZE) injection, hydrALAZINE, ondansetron **OR** ondansetron (ZOFRAN) IV, oxyCODONE  Assessment/ Plan:  59 y.o. black male  With asthma, diabetes mellitus type II, diabetic retinopathy, hypertension, gout, hyperlipidemia, and morbid obesity and ESRD   [facility] TTS first shift.   1. End stage renal  disease: with infected catheter. Now removed.  Will need tunnelled catheter eventually. Cultures from [facility] show 5/3 Kebsiella: placed on levofoxacin  Electrolytes and Volume status are acceptable No acute indication for Dialysis at present  If still hypotensive, will need CRRT in near future  2. Metabolic Encephalopathy:G93.41   - likely sepsis, and medication effect - neurontin  3. Right upper lobe  acute  pneumonia with sepsis: J18.9. I45.9; presumed aspiration pneumonia. hypotension - multiple antibiotics  - high dose pressors  4. Anemia of chronic kidney disease:   - ARANESP q weekly (TUE)   5. C Diff positive - abx per Primary team - flexiiseal.   6. Secondary Hyperparathyroidism: PTH elevated as outpatient at 1120,  - continue calcium acetate liquid for binding   - Tube feeds on hold at present      LOS: 10 Jabez Molner 5/13/201610:35 AM

## 2015-02-12 NOTE — Progress Notes (Addendum)
CODE CALLED  Patient had cardiac arrest this evening. He was given Versed for agitation then shortly after went into a. Fib then bradycardia then PEA. HE was given HCO3, epi he then went into V tach given AMIO and shocked times two. A pulse was found fter sevearl minutes of CPR.  He then coded again shortly after. AN ABG and labs were performed prior to second arrest. He had bradycardia and atropine was given.  I spoke with patient 's brother (Management consultantdecision maker) about patient's prognosis and current condition adn the CODES.  Overall, patient has poor prognosis and may code again.  Brother does not want to CODE patient again..noo shocks, medications or CPR. Okay to continue vent for now. He wil speak with MD in am about what to do next. He does understand that by not coding patient, patient may pass away.  NURSE Dois DavenportSandra also confirmed brother's wishes.  ORDERS WRITTEN  TIME 95 minutes

## 2015-02-12 NOTE — Progress Notes (Signed)
[facility] [place] [facility]   PATIENT NAME: [name] ReddenJerry [name]    MR#:  [id number]  DATE OF BIRTH:  03/08/1956  SUBJECTIVE:   Pt. Became quite hypoxic yesterday later afternoon and had to be intubated.  Shortly after being intubated pt. Went into PEA arrest.  Pt. Was given Epi, Bicarb and CPR started and pt. Got pulse back quickly but remained quite hypotensive.  Pt. Was started on Vasopressin on top of Levophed. Remains Critically ill this a.m.  REVIEW OF SYSTEMS:   Review of Systems  Unable to perform ROS as pt. Is intubated & sedated.   Nutrition: Tube Feeds on hold Tolerating PT: No Tolerating diet: Tube feeds on hold.   DRUG ALLERGIES:   Allergies  Allergen Reactions  . Penicillins Hives  . Penicillin G Other (See Comments)    VITALS:  Blood pressure 112/69, pulse 105, temperature 100.6 F (38.1 C), temperature source Axillary, resp. rate 23, height 6\' 1"  (1.854 m), weight 96.7 kg (213 lb 3 oz), SpO2 100 %.  PHYSICAL EXAMINATION:  GENERAL:  59 y.o.-year-old patient critically ill appearing in bed in NAD.  EYES: Pupils equal, round, reactive to light. No scleral icterus. HEENT: Head atraumatic, normocephalic. ETT in place. NG in place.   NECK:  Supple, no jugular venous distention. No thyroid enlargement, no tenderness.  LUNGS: (-) use of accessory muscles.  No rales, rhonchi, wheezes.   CARDIOVASCULAR: S1, S2 normal. No murmurs, rubs, or gallops.  ABDOMEN: Soft, nontender, nondistended. Bowel sounds present. No organomegaly or mass.  EXTREMITIES: No pedal edema, cyanosis, or clubbing.  NEUROLOGIC: pt. Sedated & intubated presently.  PSYCHIATRIC: sedated & intubated.  SKIN: No obvious rash, lesion, or ulcer.    LABORATORY PANEL:   CBC  Recent Labs Lab 01/23/15 1816 01/15/2015 0519  WBC 24.6* 28.3*  HGB 8.1* 8.5*  HCT 27.9* 28.6*  PLT 134* 116*    ------------------------------------------------------------------------------------------------------------------  Chemistries   Recent Labs Lab 01/17/15 2250  01/23/15 1425 01/23/15 1816 02/02/2015 0519  NA 135  < > 138  139 142 142  K 4.9  < > 3.4*  3.3* 3.5 3.9  CL 96*  < > 99*  102 102 103  CO2 25  < > 28  29 26 28   GLUCOSE 128*  < > 310*  281* 197* 166*  BUN 67*  < > 58*  61* 43* 51*  CREATININE 12.03*  < > 6.04*  5.94* 4.63* 5.01*  CALCIUM 8.5*  < > 9.0  9.1 8.7* 9.0  MG  --   < > 2.2 2.2  --   AST 29  --   --  90*  --   ALT 14*  --   --  17  --   ALKPHOS 82  --   --  132*  --   BILITOT 1.2  --   --  0.9  --   < > = values in this interval not displayed. ------------------------------------------------------------------------------------------------------------------  Cardiac Enzymes  Recent Labs Lab 01/23/15 1816 02/01/2015 0039  TROPONINI 0.66* 0.83*   ------------------------------------------------------------------------------------------------------------------  RADIOLOGY:  Dg Chest 1 View  01/23/2015   CLINICAL DATA:  Endotracheal tube placement  EXAM: CHEST  1 VIEW  COMPARISON:  01/18/2015  FINDINGS: Endotracheal tube with tip at the level of thoracic inlet T1 level. There is no pneumothorax worsening infiltrate/pneumonia in right lower lobe. NG tube in place.  IMPRESSION: Endotracheal tube with tip at the level of thoracic inlet T1 level. Advancement in mid  trachea is recommended. No pneumothorax. Worsening infiltrate/pneumonia in right lower lobe. NG tube in place.   Electronically Signed   By: Natasha Mead M.D.   On: 01/23/2015 18:34   Dg Chest Port 1 View  01/12/2015   CLINICAL DATA:  Hypoxia  EXAM: PORTABLE CHEST - 1 VIEW  COMPARISON:  Jan 23, 2015  FINDINGS: The endotracheal tube tip is 5.2 cm above the carina. Central catheter tip is in the superior cava. Nasogastric tube tip and side port are in the stomach. No apparent pneumothorax. There is  persistent airspace consolidation with volume loss in the right lower lobe. There is underlying interstitial and patchy alveolar edema bilaterally, primarily in the perihilar regions. Heart is enlarged. The pulmonary vascularity is within normal limits. No new opacity. No adenopathy.  IMPRESSION: Tube and catheter positions as described without pneumothorax. Persistent airspace consolidation right lower lobe, likely pneumonia. Perihilar edema and cardiomegaly are indicative of underlying congestive heart failure.   Electronically Signed   By: Bretta Bang III M.D.   On: 02/05/2015 07:26   Dg Chest Port 1 View  01/23/2015   CLINICAL DATA:  Central line placement  EXAM: PORTABLE CHEST - 1 VIEW  COMPARISON:  01/23/2015 at at 18:19  FINDINGS: There is a new left jugular central line with tip in the SVC. There is no pneumothorax. Endotracheal tube tip is 4 cm above the carina. Nasogastric tube extends well into the stomach. Bilateral airspace opacities have worsened, particularly in the central left lung and in both bases. This likely represents worsening alveolar edema. Bilateral pleural effusions are likely present.  IMPRESSION: Support equipment appears satisfactorily positioned.  Worsening alveolar edema and developing pleural effusions.   Electronically Signed   By: Ellery Plunk M.D.   On: 01/23/2015 21:39     ASSESSMENT AND PLAN:    * Acute Resp. Failure - likely due to pneumonia as seen on CXR.  Pt. Became quite hypoxic yesterday evening and had to be intubated.  - cont. Vent support for now.  Await Pulm. Input.   - cont. IV abx for pneumonia w/ Vanc, Levaquin. - will get ABG this a.m.   * PEA Arrest - pt. Had PEA arrest yesterday after being intubated.  - likely due to hypoxia from pneumonia/resp. Failure.  - improved w/ epi, Bicarb and no further episodes since then.   - will check 2-D Echo.   * Dialysis access site infection - BC as outpatient dialysis were + & growing Klebsiella.    - s/p Perm Cath removal by Dr schnier on 02/08/2015 - BC from 02/11/2015 & Cath tip cx 01/15/2015 remain (-) - s/p temp. Cath placement. Cont. Levaquin   * End-stage renal disease on hemodialysis. - Nephro following and cont. HD as per them.   * Sepsis - likely due to pneumonia as seen on CT chest and also on CXR 5/12, 5/13.  - remains hypotensive and cont. IV Levophed, IV vasopressin.  Keep MAP > 60.   - cont. Empiric abx w/ Vanco, Levaquin, Flagyl.  - blood cultures, sputum Cx (-) so far.   * AMS/Encephalopathy - thought to be related to uremia/sepsis but slow to improve.   - CT head (-).  Does have a leukocytosis and CT chest suggestive of pneumonia.  Likely Metabolic encephalopathy from sepsis - cont. IV Vanc, Levaquin, Flagyl - appreciate Neuro eval and no further intervention and cont. Current care.  No need for EEG.  - now intubated and sedated and will assess mental  status post-extubation.   * Leukocytosis - ?? Related to pneumonia/sepsis.  - trending down w/ IV abx therapy and will monitor.    * Diabetes type II w/out complication.  - BS stable now cont. SSI.   * Clostridium difficile colitis - diarrhea improving.   - cont. po flagyl.   * Essential Hypertension - no hypotensive w/ septic shock.  Hold BP meds.   * DM Neuropathy - cont. Neurontin   * COPD - no acute exacerbation.  - cont. Spiriva.   * Depression - cont. Cymbalta  * Hypothyroidism - cont. Synthroid.   * Nutrition -cont. Tube feeds as tolerated.    CODE STATUS: Full code.   Pt. Is multiple co morbidities and high risk for cardio-resp. Arrest.  Palliative care consult to discuss goals of care.   All the records are reviewed and case discussed with Care Management/Social Workerr. Management plans discussed with the patient, family and they are in agreement.  CODE STATUS: full code  TOTAL Critical care TIME TAKING CARE OF THIS PATIENT: 35 mins   Shavana Calder J M.D on 02/10/2015 at 9:20 AM  Between  7am to 6pm - Pager - [phone]  After 6pm go to [email or web address] - password [email or web address]  [facility]  Office  [phone]  CC: Primary care physician; No primary care provider on file.

## 2015-02-12 NOTE — Progress Notes (Signed)
1748, pt brady down to 30's and PEA again,  Code blue called, CPR iniated.  SEE Code Blue sheet for details.  Pulse regained.  Dr. Juliene PinaMody speaking with pt family via telephone, family considering making pt DNR.

## 2015-02-12 NOTE — Progress Notes (Signed)
[id number]  Per Dr. Juliene PinaMody pt wishes to make pt DNR, to continue care at this point.  All life sustaining measures maintained (vent, vasopressors)

## 2015-02-12 DEATH — deceased

## 2015-06-22 IMAGING — CR DG HIP COMPLETE 2+V*L*
1 series · 3 of 3 positions shown · non-contrast
Comparison: None.

CLINICAL DATA: Bilateral hip pain ongoing for 1 year, worse
recently.

EXAM:
LEFT HIP (WITH PELVIS) 2-3 VIEWS

[Series 1: dxr hip left complete · 0.14mm/px · 3 of 3 slices shown]
[im 1/3]
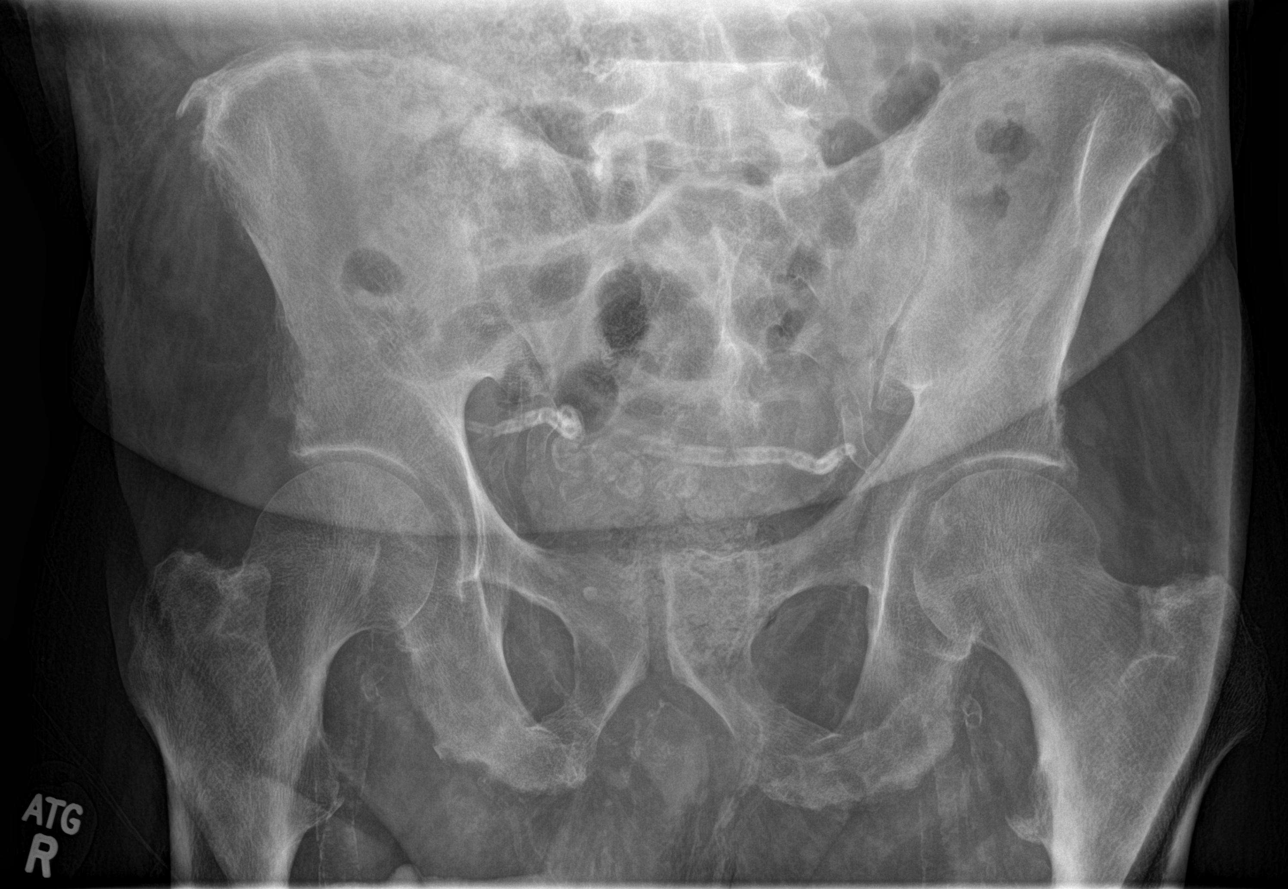
[im 2/3]
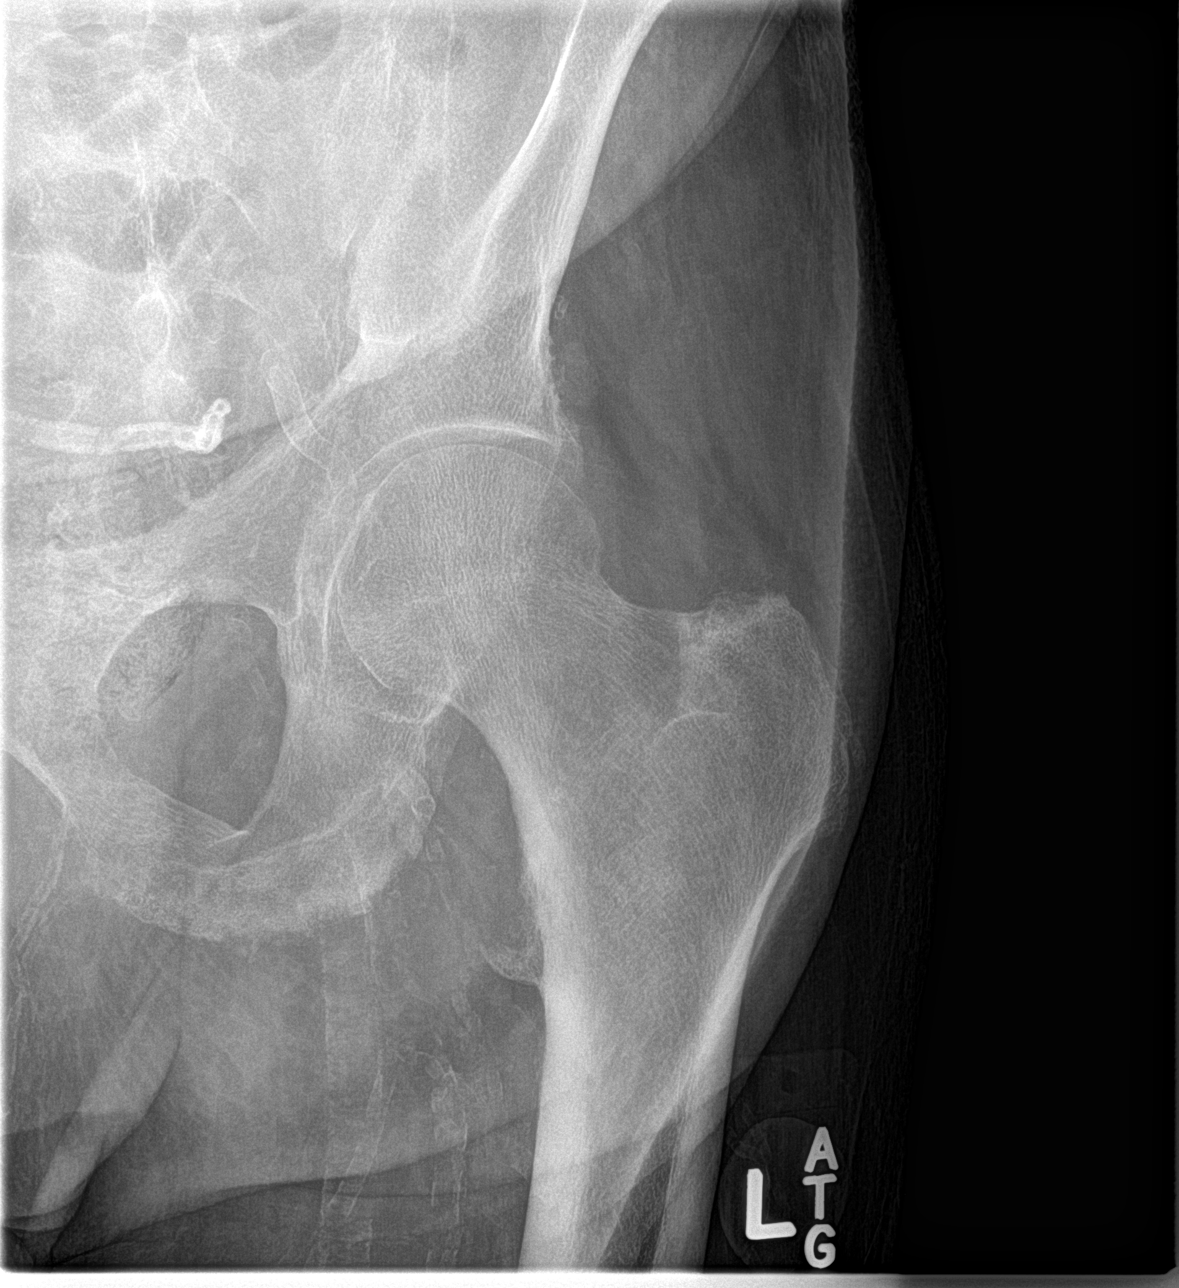
[im 3/3]
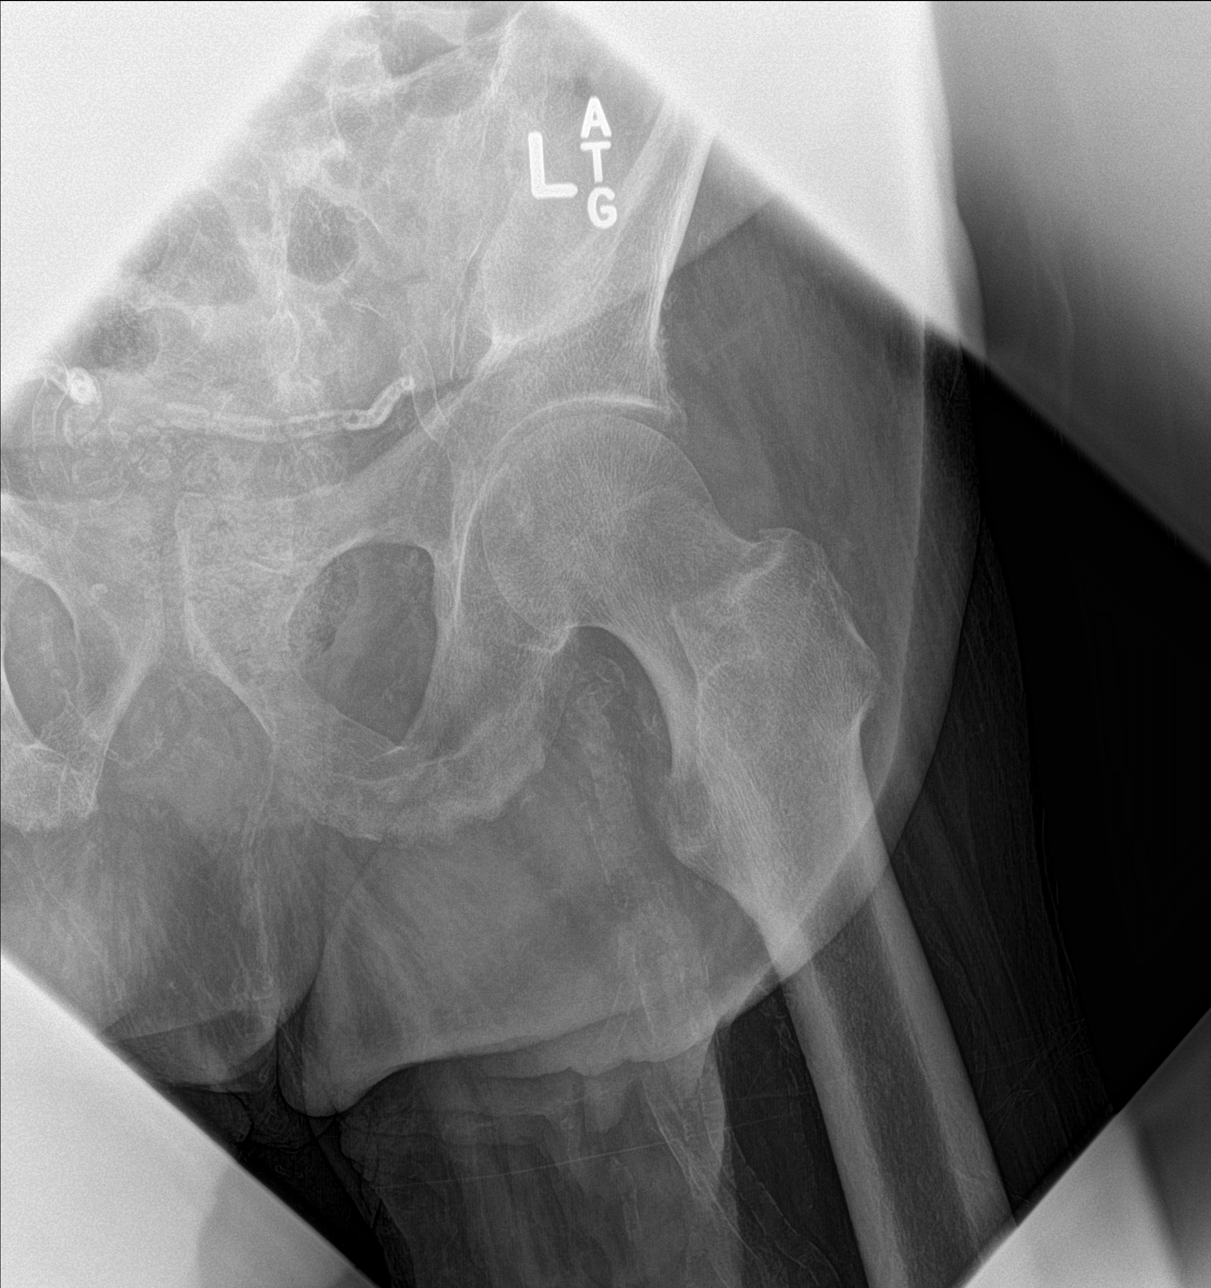

[3 of 3 positions shown; findings below may reference images not displayed]

FINDINGS: There is no evidence of hip fracture or dislocation. There is
decreased left hip joint space. Bowel content is identified
throughout visualized colon.
IMPRESSION: No acute fracture or dislocation. Decrease left hip joint space, can
be seen in osteoarthritis.

## 2015-07-23 IMAGING — CR DG ABD PORTABLE 1V
1 series · 1 of 1 positions shown · non-contrast
Comparison: 12/24/2014.

CLINICAL DATA: Nasogastric tube placed.

EXAM:
PORTABLE ABDOMEN - 1 VIEW

[ap]
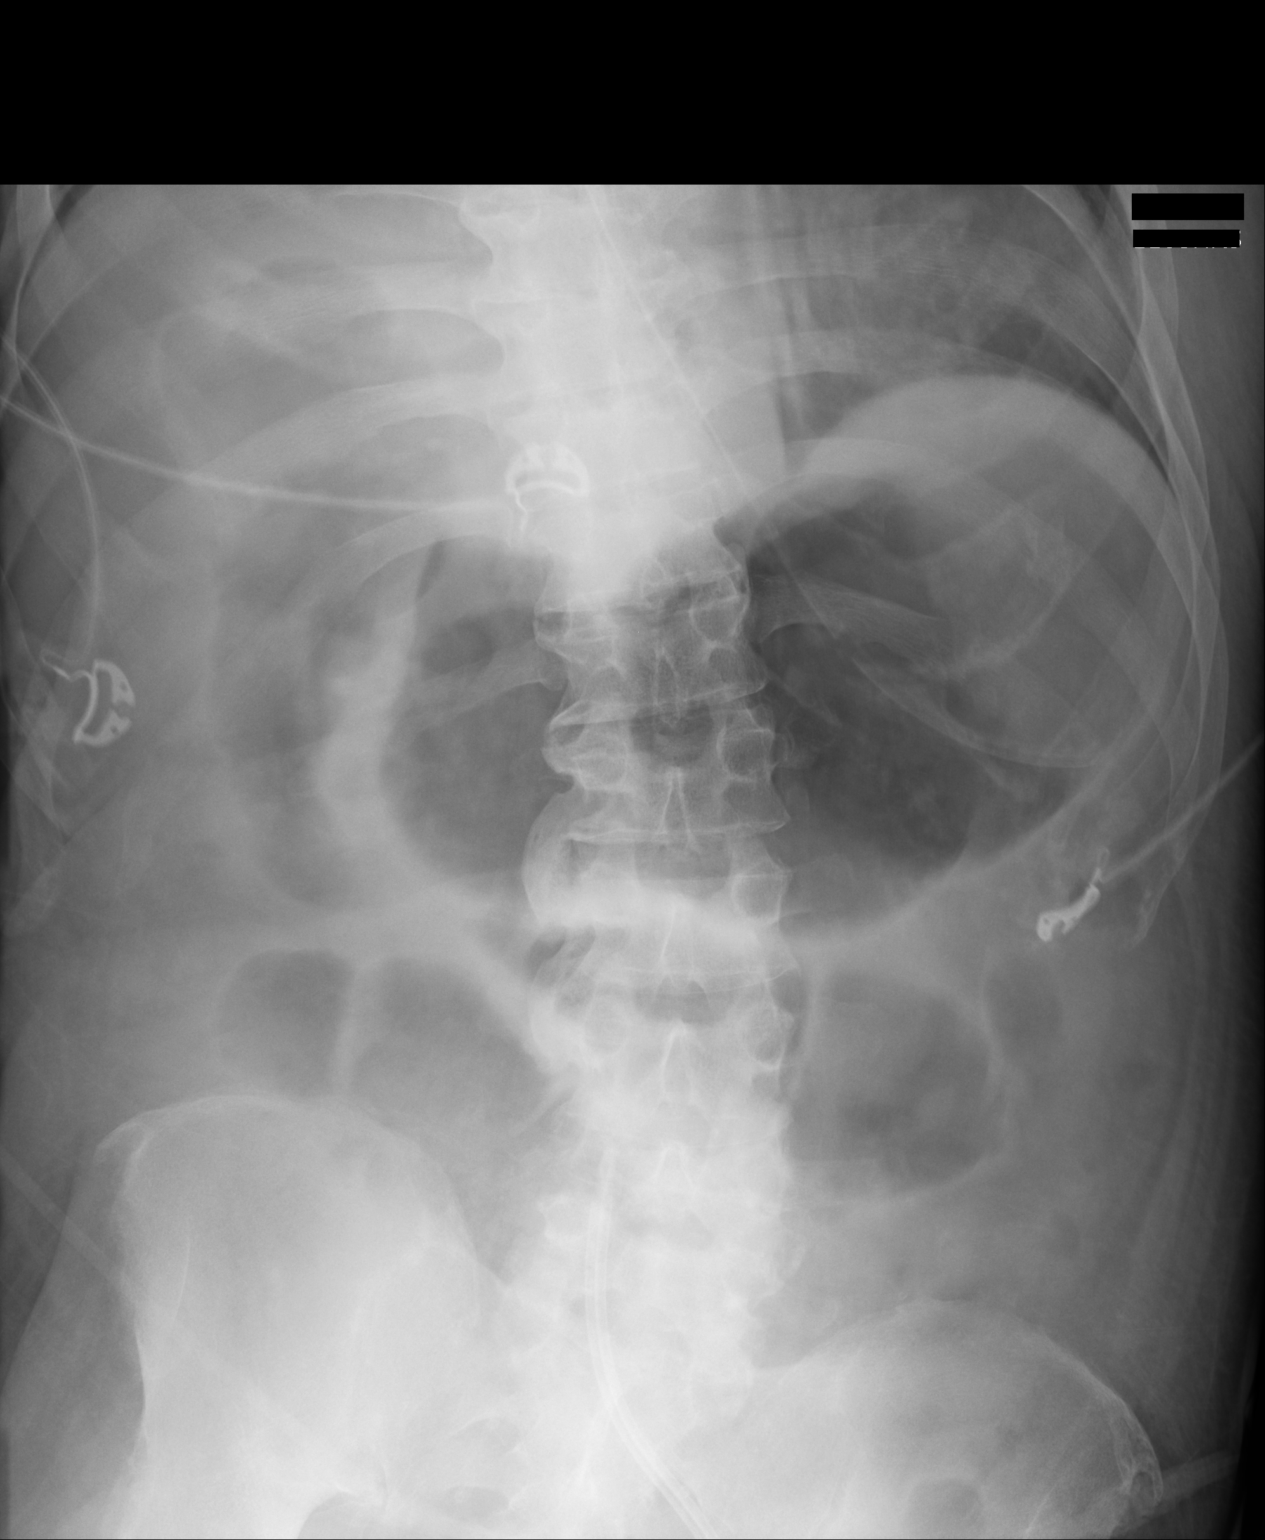

[1 of 1 positions shown; findings below may reference images not displayed]

FINDINGS: Interval nasogastric tube with its tip in the proximal stomach. Mild
gaseous distention of the stomach and mid abdominal bowel loops.
Breathing motion blurring. Left femoral catheter tip in the inferior
aspect of the inferior vena cava. Lumbar and lower thoracic spine
degenerative changes and scoliosis.
IMPRESSION: 1. Nasogastric tube tip in the proximal stomach.
2. Mild gastric and bowel distention, possibly due to partial small
bowel obstruction.

## 2015-07-27 IMAGING — CR DG CHEST 1V PORT
1 series · 1 of 1 positions shown · non-contrast
Comparison: 01/23/2015 at at [DATE]

CLINICAL DATA: Central line placement

EXAM:
PORTABLE CHEST - 1 VIEW

[ap]
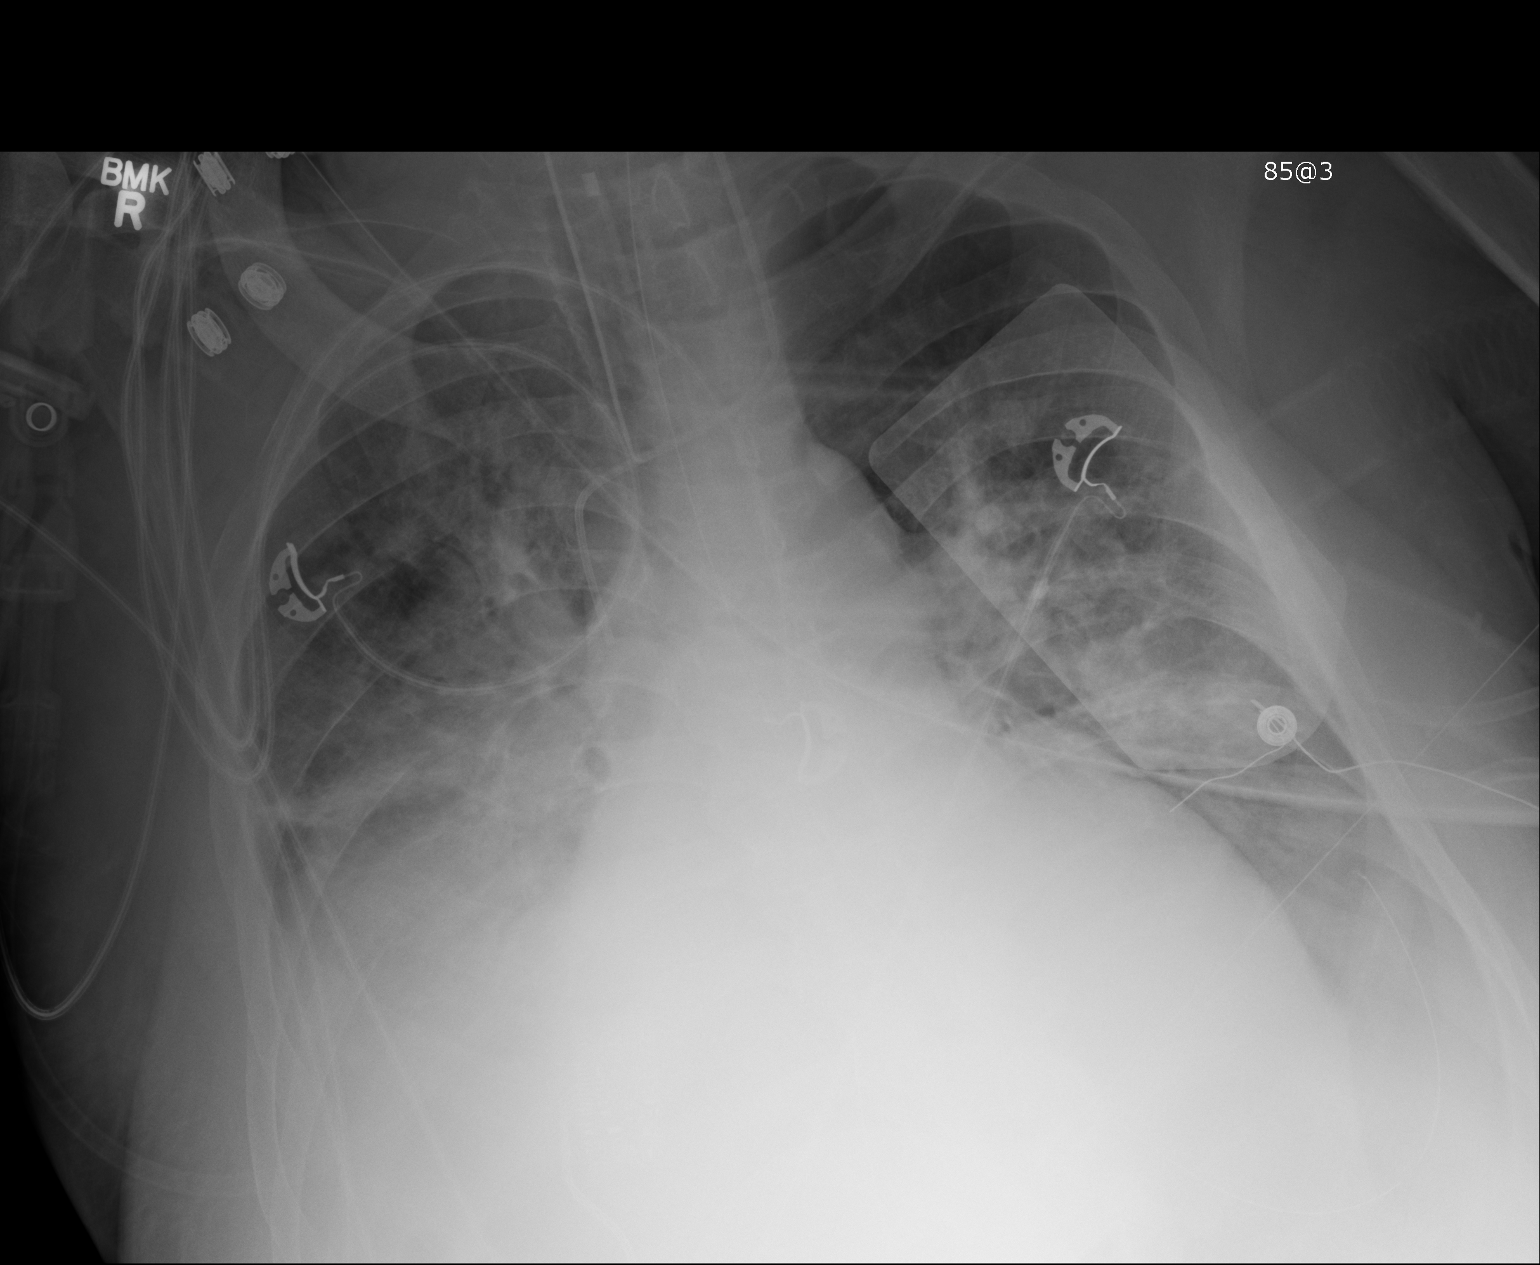

[1 of 1 positions shown; findings below may reference images not displayed]

FINDINGS: There is a new left jugular central line with tip in the SVC. There
is no pneumothorax. Endotracheal tube tip is 4 cm above the carina.
Nasogastric tube extends well into the stomach. Bilateral airspace
opacities have worsened, particularly in the central left lung and
in both bases. This likely represents worsening alveolar edema.
Bilateral pleural effusions are likely present.
IMPRESSION: Support equipment appears satisfactorily positioned.

Worsening alveolar edema and developing pleural effusions.

## 2015-07-28 IMAGING — CR DG CHEST 1V PORT
1 series · 1 of 1 positions shown · non-contrast
Comparison: January 23, 2015

CLINICAL DATA: Hypoxia

EXAM:
PORTABLE CHEST - 1 VIEW

[portable]
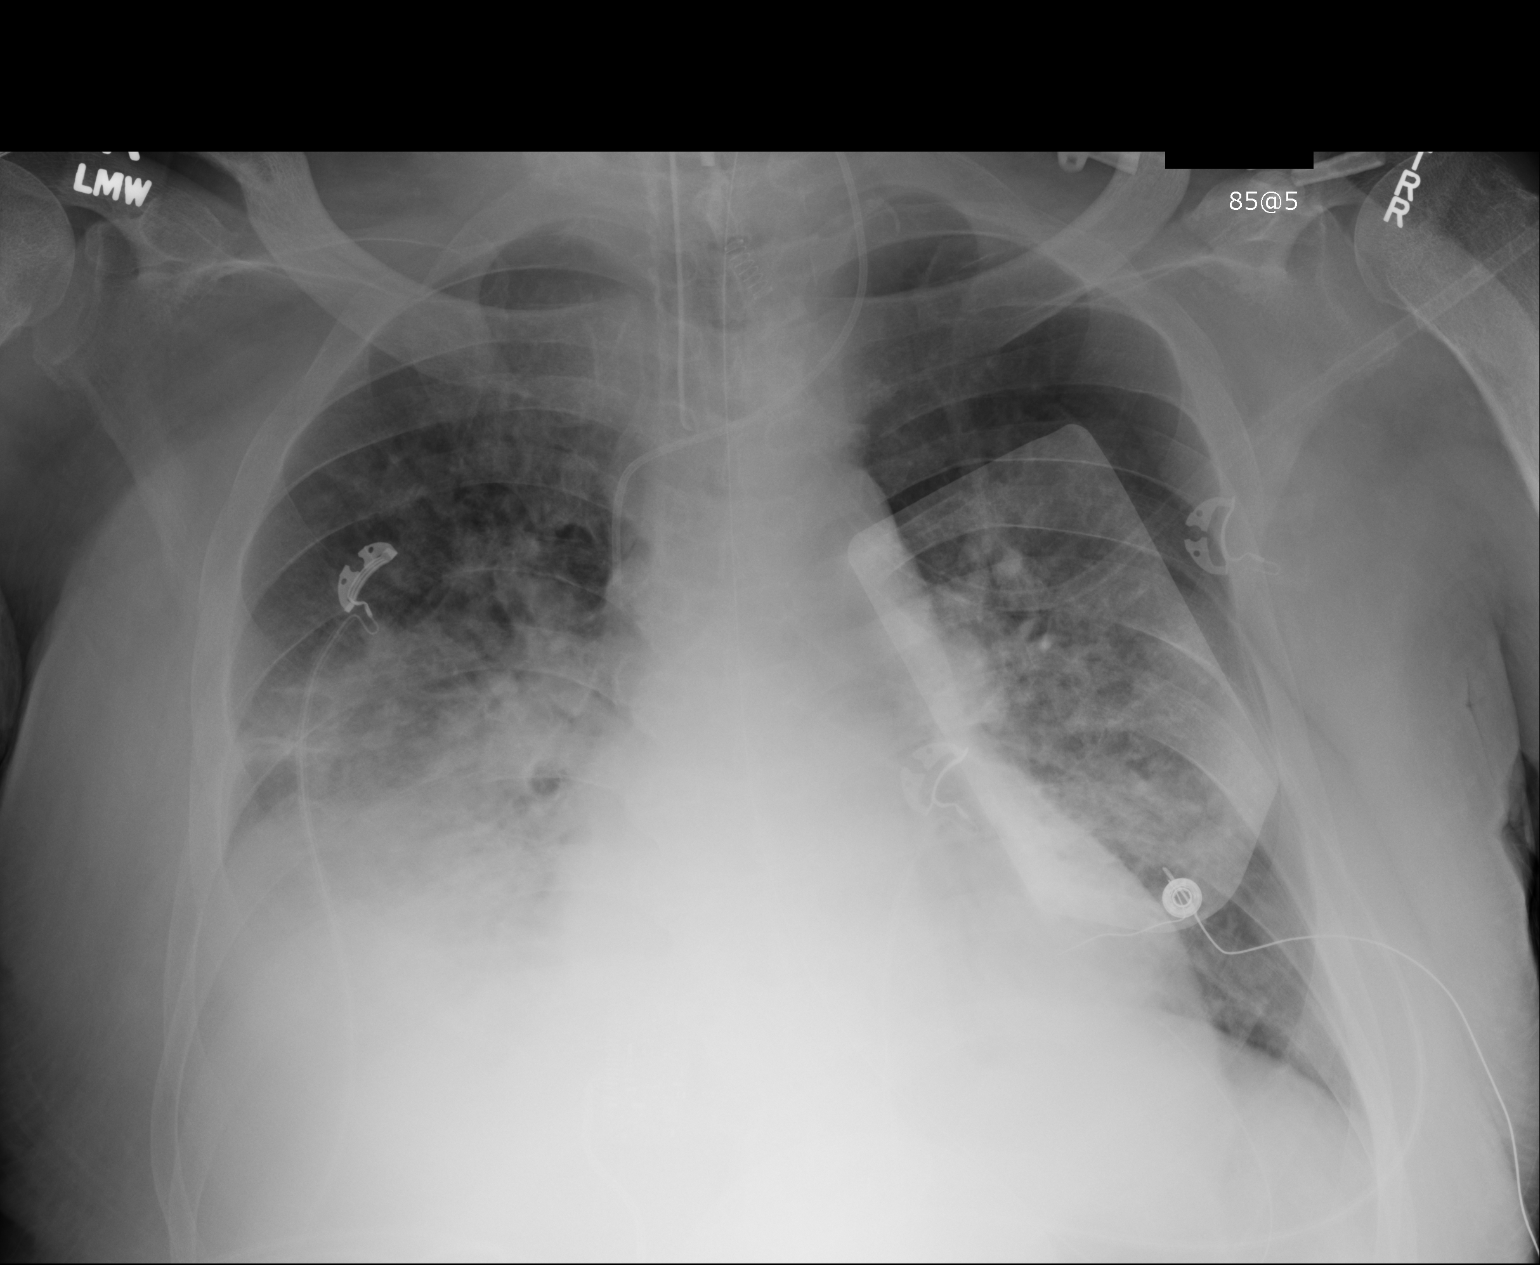

[1 of 1 positions shown; findings below may reference images not displayed]

FINDINGS: The endotracheal tube tip is 5.2 cm above the carina. Central
catheter tip is in the superior cava. Nasogastric tube tip and side
port are in the stomach. No apparent pneumothorax. There is
persistent airspace consolidation with volume loss in the right
lower lobe. There is underlying interstitial and patchy alveolar
edema bilaterally, primarily in the perihilar regions. Heart is
enlarged. The pulmonary vascularity is within normal limits. No new
opacity. No adenopathy.
IMPRESSION: Tube and catheter positions as described without pneumothorax.
Persistent airspace consolidation right lower lobe, likely
pneumonia. Perihilar edema and cardiomegaly are indicative of
underlying congestive heart failure.

## 2015-07-28 IMAGING — CR DG CHEST 1V PORT
1 series · 1 of 1 positions shown · non-contrast
Comparison: Same day.

CLINICAL DATA: Intubated.

EXAM:
PORTABLE CHEST - 1 VIEW

[ap]
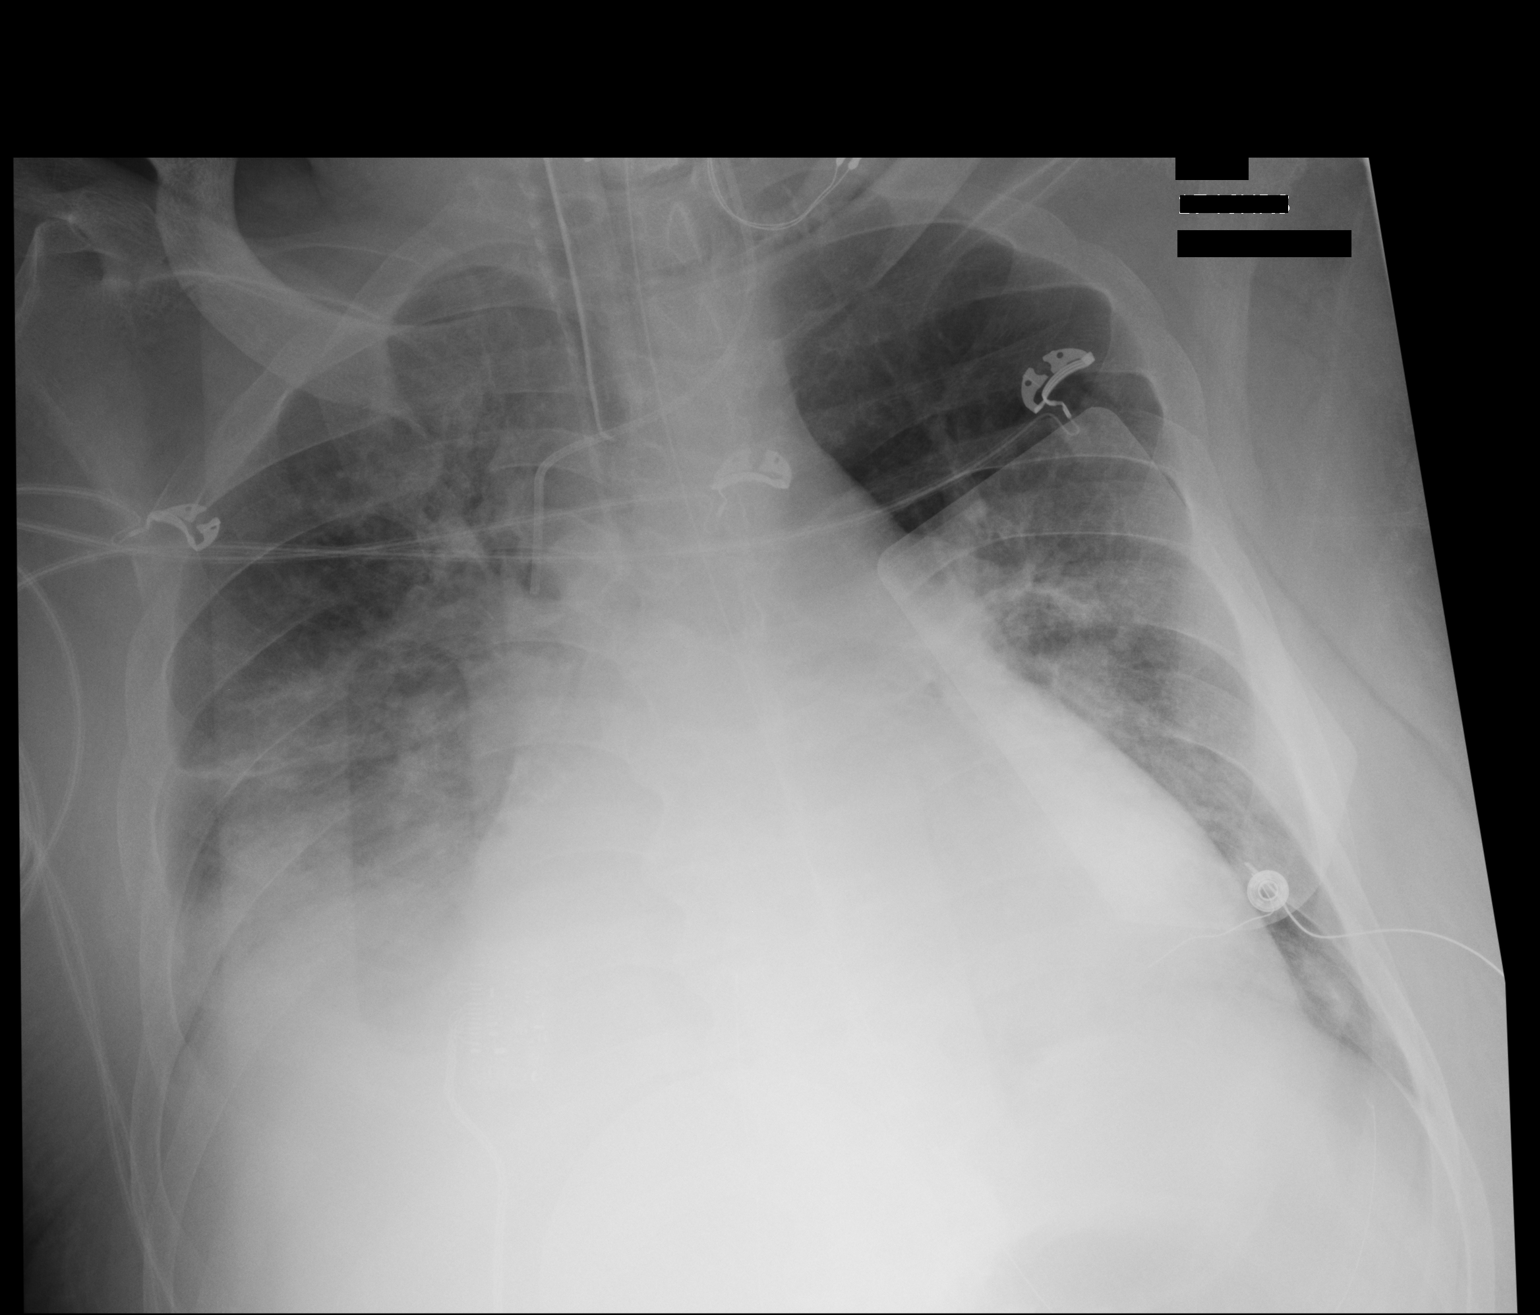

[1 of 1 positions shown; findings below may reference images not displayed]

FINDINGS: Stable cardiomegaly. Endotracheal tube is in grossly good position
with distal tip 4.5 cm above the carina. Left internal jugular
catheter is noted with distal tip overlying expected position of the
SVC. No pneumothorax is noted. Left lung appears clear. Stable right
lower lobe opacity is noted concerning for edema, pneumonia or
atelectasis.
IMPRESSION: Stable support apparatus. Stable right lower lobe opacity concerning
for edema, pneumonia or atelectasis.
